# Patient Record
Sex: Female | Born: 1935 | Race: White | Hispanic: No | Smoking: Never smoker
Health system: Southern US, Community
[De-identification: ages and names within clinical notes are randomized; demographics above are authoritative.]

## PROBLEM LIST (undated history)

## (undated) DIAGNOSIS — F329 Major depressive disorder, single episode, unspecified: Secondary | ICD-10-CM

## (undated) DIAGNOSIS — E039 Hypothyroidism, unspecified: Secondary | ICD-10-CM

## (undated) DIAGNOSIS — M6281 Muscle weakness (generalized): Secondary | ICD-10-CM

## (undated) DIAGNOSIS — R1310 Dysphagia, unspecified: Secondary | ICD-10-CM

## (undated) DIAGNOSIS — M810 Age-related osteoporosis without current pathological fracture: Secondary | ICD-10-CM

## (undated) DIAGNOSIS — F0390 Unspecified dementia without behavioral disturbance: Secondary | ICD-10-CM

## (undated) DIAGNOSIS — G4700 Insomnia, unspecified: Secondary | ICD-10-CM

## (undated) DIAGNOSIS — S12000A Unspecified displaced fracture of first cervical vertebra, initial encounter for closed fracture: Secondary | ICD-10-CM

---

## 2018-07-30 ENCOUNTER — Encounter (HOSPITAL_COMMUNITY): Payer: Self-pay | Admitting: Emergency Medicine

## 2018-07-30 ENCOUNTER — Emergency Department (HOSPITAL_COMMUNITY): Payer: Medicare Other

## 2018-07-30 ENCOUNTER — Other Ambulatory Visit: Payer: Self-pay

## 2018-07-30 ENCOUNTER — Emergency Department (HOSPITAL_COMMUNITY)
Admission: EM | Admit: 2018-07-30 | Discharge: 2018-07-30 | Disposition: A | Discharge status: Home / Self Care | Payer: Medicare Other | Attending: Emergency Medicine | Admitting: Emergency Medicine

## 2018-07-30 DIAGNOSIS — Z79899 Other long term (current) drug therapy: Secondary | ICD-10-CM | POA: Insufficient documentation

## 2018-07-30 DIAGNOSIS — F0390 Unspecified dementia without behavioral disturbance: Secondary | ICD-10-CM | POA: Diagnosis not present

## 2018-07-30 DIAGNOSIS — Y9389 Activity, other specified: Secondary | ICD-10-CM | POA: Diagnosis not present

## 2018-07-30 DIAGNOSIS — S0990XA Unspecified injury of head, initial encounter: Secondary | ICD-10-CM | POA: Diagnosis present

## 2018-07-30 DIAGNOSIS — W06XXXA Fall from bed, initial encounter: Secondary | ICD-10-CM | POA: Diagnosis not present

## 2018-07-30 DIAGNOSIS — Y92199 Unspecified place in other specified residential institution as the place of occurrence of the external cause: Secondary | ICD-10-CM | POA: Insufficient documentation

## 2018-07-30 DIAGNOSIS — Y998 Other external cause status: Secondary | ICD-10-CM | POA: Insufficient documentation

## 2018-07-30 DIAGNOSIS — W19XXXA Unspecified fall, initial encounter: Secondary | ICD-10-CM

## 2018-07-30 HISTORY — DX: Unspecified dementia, unspecified severity, without behavioral disturbance, psychotic disturbance, mood disturbance, and anxiety: F03.90

## 2018-07-30 HISTORY — DX: Hypothyroidism, unspecified: E03.9

## 2018-07-30 HISTORY — DX: Unspecified displaced fracture of first cervical vertebra, initial encounter for closed fracture: S12.000A

## 2018-07-30 HISTORY — DX: Major depressive disorder, single episode, unspecified: F32.9

## 2018-07-30 HISTORY — DX: Dysphagia, unspecified: R13.10

## 2018-07-30 HISTORY — DX: Insomnia, unspecified: G47.00

## 2018-07-30 HISTORY — DX: Muscle weakness (generalized): M62.81

## 2018-07-30 HISTORY — DX: Age-related osteoporosis without current pathological fracture: M81.0

## 2018-07-30 NOTE — ED Notes (Signed)
Bed: WA09 Expected date:  Expected time:  Means of arrival:  Comments: 73F fall from facility

## 2018-07-30 NOTE — ED Notes (Signed)
Patient transported to CT 

## 2018-07-30 NOTE — Discharge Instructions (Addendum)
Return here as needed follow-up with her neurosurgeon for recheck.  Return here as needed.

## 2018-07-30 NOTE — ED Triage Notes (Signed)
Pt from [facility]. Patient per staff rolled out of bed onto floor. No deformities noted. Patient has no complaints of pain and no complaints on palpation. Patient alert only to self, hx dementia. At baseline.

## 2018-07-30 NOTE — ED Provider Notes (Signed)
[facility] DEPT Provider Note   CSN: [id number] Arrival date & time: 07/30/18  0603     History   Chief Complaint Chief Complaint  Patient presents with  . Fall    HPI Julia Carey is a 84 y.o. female.  HPI Patient presents to the emergency department after rolling out of bed.  The patient lives in assisted living facility and she had rolled out of bed onto the floor.  Patient has no complaints.  The patient states that is not hurting anywhere.  Patient denies headache, blurred vision, chest pain, shortness of breath, weakness, numbness, neck pain, back pain, extremity pain, abdominal pain, nausea, vomiting, or syncope.  Patient had a fall in August where she had a C1 fracture.  She was placed in a hard collar and had close follow-up with neurosurgery. Past Medical History:  Diagnosis Date  . Age-related osteoporosis without current pathological fracture   . Dementia ([employer])   . Dysphagia   . Fracture of first cervical vertebra (HCC)   . Hypothyroidism   . Insomnia   . Major depression, single episode   . Muscle weakness (generalized)     There are no active problems to display for this patient.      OB History   None      Home Medications    Prior to Admission medications   Medication Sig Start Date End Date Taking? Authorizing Provider  acetaminophen (TYLENOL) 325 MG tablet Take 650 mg by mouth every 8 (eight) hours.    Yes [provider]  aspirin 81 MG chewable tablet Chew 81 mg by mouth as directed. Every 2 days   Yes [provider]  carboxymethylcellul-glycerin (OPTIVE) 0.5-0.9 % ophthalmic solution Place 1 drop into both eyes 4 (four) times daily.   Yes [provider]  cetirizine (ZYRTEC) 10 MG tablet Take 10 mg by mouth daily.   Yes [provider]  donepezil (ARICEPT) 5 MG tablet Take 5 mg by mouth at bedtime.   Yes [provider]  DULoxetine (CYMBALTA) 30 MG capsule Take 30 mg  by mouth daily.   Yes [provider]  fluticasone (FLONASE) 50 MCG/ACT nasal spray Place 1 spray into both nostrils daily.   Yes [provider]  levothyroxine (SYNTHROID, LEVOTHROID) 50 MCG tablet Take 50 mcg by mouth daily before breakfast.   Yes [provider]  losartan (COZAAR) 50 MG tablet Take 50 mg by mouth daily.   Yes [provider]  Melatonin 3 MG TABS Take 3 mg by mouth at bedtime.   Yes [provider]  methocarbamol (ROBAXIN) 500 MG tablet Take 500 mg by mouth every 12 (twelve) hours as needed for muscle spasms.   Yes [provider]  metoprolol tartrate (LOPRESSOR) 25 MG tablet Take 25 mg by mouth daily.   Yes [provider]  omeprazole (PRILOSEC) 20 MG capsule Take 20 mg by mouth daily as needed (acid reflux).    Yes [provider]  polyethylene glycol (MIRALAX / GLYCOLAX) packet Take 17 g by mouth daily.   Yes [provider]  vitamin B-12 (CYANOCOBALAMIN) 1000 MCG tablet Take 1,000 mcg by mouth as directed. Every 2 days   Yes [provider]    Family History No family history on file.  Social History Social History   Tobacco Use  . Smoking status: Unknown If Ever Smoked  Substance Use Topics  . Alcohol use: Not Currently  . Drug use: Not Currently  Allergies   Amoxicillin; Clindamycin/lincomycin; and Keflex [cephalexin]   Review of Systems Review of Systems All other systems negative except as documented in the HPI. All pertinent positives and negatives as reviewed in the HPI.  Physical Exam Updated Vital Signs BP (!) 203/92   Pulse 82   Temp 97.6 F (36.4 C) (Oral)   Resp 14   SpO2 95%   Physical Exam  Constitutional: She is oriented to person, place, and time. She appears well-developed and well-nourished. No distress.  HENT:  Head: Normocephalic and atraumatic.  Mouth/Throat: Oropharynx is clear and moist.  Eyes: Pupils are equal, round, and reactive  to light.  Neck: Normal range of motion. Neck supple.  Cardiovascular: Normal rate, regular rhythm and normal heart sounds. Exam reveals no gallop and no friction rub.  No murmur heard. Pulmonary/Chest: Effort normal and breath sounds normal. No respiratory distress. She has no wheezes.  Abdominal: Soft. Bowel sounds are normal. She exhibits no distension. There is no tenderness. There is no guarding.  Musculoskeletal:       Right shoulder: Normal.       Left shoulder: Normal.       Right hip: Normal. She exhibits normal range of motion.       Left hip: Normal. She exhibits normal range of motion.       Right knee: Normal.       Left knee: Normal.       Right ankle: Normal.       Left ankle: Normal.       Cervical back: Normal.       Thoracic back: Normal.       Lumbar back: Normal.  Neurological: She is alert and oriented to person, place, and time. She has normal strength. No sensory deficit. She exhibits normal muscle tone. Coordination normal. GCS eye subscore is 4. GCS verbal subscore is 5. GCS motor subscore is 6.  Skin: Skin is warm and dry. Capillary refill takes less than 2 seconds. No rash noted. No erythema.  Psychiatric: She has a normal mood and affect. Her behavior is normal.  Nursing note and vitals reviewed.    ED Treatments / Results  Labs (all labs ordered are listed, but only abnormal results are displayed) Labs Reviewed - No data to display  EKG None  Radiology Ct Head Wo Contrast  Result Date: 07/30/2018 CLINICAL DATA:  Head trauma EXAM: CT HEAD WITHOUT CONTRAST CT CERVICAL SPINE WITHOUT CONTRAST TECHNIQUE: Multidetector CT imaging of the head and cervical spine was performed following the standard protocol without intravenous contrast. Multiplanar CT image reconstructions of the cervical spine were also generated. COMPARISON:  None. FINDINGS: CT HEAD FINDINGS Brain: There is atrophy and chronic small vessel disease changes. No acute intracranial abnormality.  Specifically, no hemorrhage, hydrocephalus, mass lesion, acute infarction, or significant intracranial injury. Vascular: No hyperdense vessel or unexpected calcification. Skull: No acute calvarial abnormality. Sinuses/Orbits: Visualized paranasal sinuses and mastoids clear. Orbital soft tissues unremarkable. Other: None CT CERVICAL SPINE FINDINGS Alignment: Severe rightward scoliosis in the cervical spine. Anterolisthesis of C4 on C5 measures 3 mm. 2 mm anterolisthesis of C6 on C7. Findings likely related to facet disease. Skull base and vertebrae: Fractures are noted through the C1 ring. Large gap noted in both the anterior and posterior ring. The underlying bones appear corticated. There appears to be a large amount of the posterior C1 ring that is absent, likely resorbed. The appearance is most suggestive of an old C1 fracture with nonunion. No definite  acute fracture noted. Soft tissues and spinal canal: Prevertebral soft tissues are normal. No epidural or paraspinal hematoma. Disc levels:  Diffuse degenerative disc and facet disease. Upper chest: Biapical scarring. Other: None IMPRESSION: No acute intracranial abnormality. Atrophy, chronic microvascular disease. Severe right word scoliosis in the cervical spine with advanced degenerative disc and facet disease. Chronic appearing/old fractures through the anterior and posterior rings of C1. A large amount of the posterior ring appears resorbed. I see no definite acute fracture. Electronically Signed   By: Rolm Baptise M.D.   On: 07/30/2018 09:23   Ct Cervical Spine Wo Contrast  Result Date: 07/30/2018 CLINICAL DATA:  Head trauma EXAM: CT HEAD WITHOUT CONTRAST CT CERVICAL SPINE WITHOUT CONTRAST TECHNIQUE: Multidetector CT imaging of the head and cervical spine was performed following the standard protocol without intravenous contrast. Multiplanar CT image reconstructions of the cervical spine were also generated. COMPARISON:  None. FINDINGS: CT HEAD FINDINGS  Brain: There is atrophy and chronic small vessel disease changes. No acute intracranial abnormality. Specifically, no hemorrhage, hydrocephalus, mass lesion, acute infarction, or significant intracranial injury. Vascular: No hyperdense vessel or unexpected calcification. Skull: No acute calvarial abnormality. Sinuses/Orbits: Visualized paranasal sinuses and mastoids clear. Orbital soft tissues unremarkable. Other: None CT CERVICAL SPINE FINDINGS Alignment: Severe rightward scoliosis in the cervical spine. Anterolisthesis of C4 on C5 measures 3 mm. 2 mm anterolisthesis of C6 on C7. Findings likely related to facet disease. Skull base and vertebrae: Fractures are noted through the C1 ring. Large gap noted in both the anterior and posterior ring. The underlying bones appear corticated. There appears to be a large amount of the posterior C1 ring that is absent, likely resorbed. The appearance is most suggestive of an old C1 fracture with nonunion. No definite acute fracture noted. Soft tissues and spinal canal: Prevertebral soft tissues are normal. No epidural or paraspinal hematoma. Disc levels:  Diffuse degenerative disc and facet disease. Upper chest: Biapical scarring. Other: None IMPRESSION: No acute intracranial abnormality. Atrophy, chronic microvascular disease. Severe right word scoliosis in the cervical spine with advanced degenerative disc and facet disease. Chronic appearing/old fractures through the anterior and posterior rings of C1. A large amount of the posterior ring appears resorbed. I see no definite acute fracture. Electronically Signed   By: Rolm Baptise M.D.   On: 07/30/2018 09:23    Procedures Procedures (including critical care time)  Medications Ordered in ED Medications - No data to display   Initial Impression / Assessment and Plan / ED Course  I have reviewed the triage vital signs and the nursing notes.  Pertinent labs & imaging results that were available during my care of the  patient were reviewed by me and considered in my medical decision making (see chart for details).    Patient will be discharged back to the nursing facility.  The patient CT scans did not show any significant abnormality at this time.  Patient is advised of the plan and all questions were answered.  The patient's son is also in the room and given the information.  I have advised him to follow-up with her neurosurgeon for recheck of her neck.  Patient has no neurological deficits or abnormalities noted on examination.  Final Clinical Impressions(s) / ED Diagnoses   Final diagnoses:  None    ED Discharge Orders    None       Dalia Heading, PA-C 07/30/18 [id number]    Jola Schmidt, MD 07/30/18 1640

## 2018-07-30 NOTE — ED Notes (Signed)
PA aware of HTN. Patient has not gotten her morning meds.

## 2018-08-29 ENCOUNTER — Emergency Department (HOSPITAL_COMMUNITY): Payer: Medicare Other

## 2018-08-29 ENCOUNTER — Encounter (HOSPITAL_COMMUNITY): Payer: Self-pay

## 2018-08-29 ENCOUNTER — Emergency Department (HOSPITAL_COMMUNITY)
Admission: EM | Admit: 2018-08-29 | Discharge: 2018-08-29 | Disposition: A | Discharge status: Skilled Nursing Facility | Payer: Medicare Other | Attending: Emergency Medicine | Admitting: Emergency Medicine

## 2018-08-29 DIAGNOSIS — E039 Hypothyroidism, unspecified: Secondary | ICD-10-CM | POA: Insufficient documentation

## 2018-08-29 DIAGNOSIS — W010XXA Fall on same level from slipping, tripping and stumbling without subsequent striking against object, initial encounter: Secondary | ICD-10-CM

## 2018-08-29 DIAGNOSIS — Z79899 Other long term (current) drug therapy: Secondary | ICD-10-CM | POA: Diagnosis not present

## 2018-08-29 DIAGNOSIS — W19XXXA Unspecified fall, initial encounter: Secondary | ICD-10-CM | POA: Diagnosis not present

## 2018-08-29 DIAGNOSIS — S6991XA Unspecified injury of right wrist, hand and finger(s), initial encounter: Secondary | ICD-10-CM | POA: Diagnosis present

## 2018-08-29 DIAGNOSIS — Y999 Unspecified external cause status: Secondary | ICD-10-CM | POA: Diagnosis not present

## 2018-08-29 DIAGNOSIS — S52501A Unspecified fracture of the lower end of right radius, initial encounter for closed fracture: Secondary | ICD-10-CM | POA: Diagnosis not present

## 2018-08-29 DIAGNOSIS — Z7982 Long term (current) use of aspirin: Secondary | ICD-10-CM | POA: Diagnosis not present

## 2018-08-29 DIAGNOSIS — F0390 Unspecified dementia without behavioral disturbance: Secondary | ICD-10-CM | POA: Diagnosis not present

## 2018-08-29 DIAGNOSIS — Y92129 Unspecified place in nursing home as the place of occurrence of the external cause: Secondary | ICD-10-CM | POA: Insufficient documentation

## 2018-08-29 DIAGNOSIS — Y939 Activity, unspecified: Secondary | ICD-10-CM | POA: Diagnosis not present

## 2018-08-29 MED ORDER — ACETAMINOPHEN 325 MG PO TABS
650.0000 mg | ORAL_TABLET | Freq: Once | ORAL | Status: AC
  Administered 2018-08-29: 650 mg via ORAL
  Filled 2018-08-29: qty 2

## 2018-08-29 NOTE — ED Notes (Signed)
Ortho has applied splint and immobilizer

## 2018-08-29 NOTE — ED Provider Notes (Signed)
[facility] DEPT Provider Note   CSN: [id number] Arrival date & time: 08/29/18  1804     History   Chief Complaint No chief complaint on file.   HPI Julia Carey is a 84 y.o. female.  Patient s/p fall at ecf. No noted loc. Pt denies faintness or dizziness. No headache. Denies neck or back pain - wears soft cervical collar due to prior neck injury. Patients mental status has remains c/w baseline since fall. Pt c/o right wrist pain, constant, dull, moderate, non radiating. No associated numbness/weakness. Denies other pain or injury. Skin is intact.   The history is provided by the patient and the EMS personnel.    Past Medical History:  Diagnosis Date  . Age-related osteoporosis without current pathological fracture   . Dementia ([place])   . Dysphagia   . Fracture of first cervical vertebra (HCC)   . Hypothyroidism   . Insomnia   . Major depression, single episode   . Muscle weakness (generalized)     There are no active problems to display for this patient.   History reviewed. No pertinent surgical history.   OB History   None      Home Medications    Prior to Admission medications   Medication Sig Start Date End Date Taking? Authorizing Provider  acetaminophen (TYLENOL) 325 MG tablet Take 650 mg by mouth every 8 (eight) hours.     [provider]  aspirin 81 MG chewable tablet Chew 81 mg by mouth as directed. Every 2 days    [provider]  carboxymethylcellul-glycerin (OPTIVE) 0.5-0.9 % ophthalmic solution Place 1 drop into both eyes 4 (four) times daily.    [provider]  cetirizine (ZYRTEC) 10 MG tablet Take 10 mg by mouth daily.    [provider]  donepezil (ARICEPT) 5 MG tablet Take 5 mg by mouth at bedtime.    [provider]  DULoxetine (CYMBALTA) 30 MG capsule Take 30 mg by mouth daily.    [provider]  fluticasone (FLONASE) 50 MCG/ACT nasal spray Place 1 spray  into both nostrils daily.    [provider]  levothyroxine (SYNTHROID, LEVOTHROID) 50 MCG tablet Take 50 mcg by mouth daily before breakfast.    [provider]  losartan (COZAAR) 50 MG tablet Take 50 mg by mouth daily.    [provider]  Melatonin 3 MG TABS Take 3 mg by mouth at bedtime.    [provider]  methocarbamol (ROBAXIN) 500 MG tablet Take 500 mg by mouth every 12 (twelve) hours as needed for muscle spasms.    [provider]  metoprolol tartrate (LOPRESSOR) 25 MG tablet Take 25 mg by mouth daily.    [provider]  omeprazole (PRILOSEC) 20 MG capsule Take 20 mg by mouth daily as needed (acid reflux).     [provider]  polyethylene glycol (MIRALAX / GLYCOLAX) packet Take 17 g by mouth daily.    [provider]  vitamin B-12 (CYANOCOBALAMIN) 1000 MCG tablet Take 1,000 mcg by mouth as directed. Every 2 days    [provider]    Family History No family history on file.  Social History Social History   Tobacco Use  . Smoking status: Unknown If Ever Smoked  Substance Use Topics  . Alcohol use: Not Currently  . Drug use: Not Currently     Allergies   Amoxicillin; Clindamycin/lincomycin; and Keflex [cephalexin]   Review of Systems Review of Systems  Constitutional: Negative for fever.  HENT: Negative for nosebleeds.   Eyes: Negative for pain and visual disturbance.  Respiratory: Negative for shortness of breath.   Cardiovascular: Negative for chest pain.  Gastrointestinal: Negative for abdominal pain, nausea and vomiting.  Genitourinary: Negative for flank pain.  Musculoskeletal: Negative for back pain and neck pain.  Skin: Negative for wound.  Neurological: Negative for numbness and headaches.  Hematological: Does not bruise/bleed easily.  Psychiatric/Behavioral: Negative for agitation.     Physical Exam Updated Vital Signs BP (!) 215/77 (BP Location: Left Arm)   Pulse 75    Temp 97.9 F (36.6 C) (Oral)   Resp 15   SpO2 96%   Physical Exam  Constitutional: She appears well-developed and well-nourished.  HENT:  Head: Atraumatic.  No head/face/scalp sts, contusion, or tenderness.   Eyes: Pupils are equal, round, and reactive to light. Conjunctivae are normal. No scleral icterus.  Neck: Neck supple. No tracheal deviation present.  Soft cervical collar in place.   Cardiovascular: Normal rate, regular rhythm, normal heart sounds and intact distal pulses.  Pulmonary/Chest: Effort normal and breath sounds normal. No respiratory distress. She exhibits no tenderness.  Abdominal: Soft. Normal appearance and bowel sounds are normal. She exhibits no distension. There is no tenderness.  Musculoskeletal: She exhibits no edema.  CTLS spine, non tender, aligned, no step off. Tenderness right wrist, otherwise good rom bil extremities without pain or focal bony tenderness. Radial pulse 2+ bil.   Neurological: She is alert.  Awake and alert. Speech clear. Motor/sens grossly intact bil.   Skin: Skin is warm and dry. No rash noted.  Psychiatric: She has a normal mood and affect.  Nursing note and vitals reviewed.    ED Treatments / Results  Labs (all labs ordered are listed, but only abnormal results are displayed) Labs Reviewed - No data to display  EKG None  Radiology Dg Wrist Complete Right  Result Date: 08/29/2018 CLINICAL DATA:  Right wrist pain following a fall. EXAM: RIGHT WRIST - COMPLETE 3+ VIEW COMPARISON:  None. FINDINGS: Transverse fracture of the distal radial metaphysis with dorsal displacement of a distal fragment and dorsal angulation of the main distal fragment. There may be extension into the radiocarpal joint. The distal ulna is intact. Screw and plate fixation of the 1st metacarpal is noted with degenerative changes at the 1st metacarpal/carpal joint with bony remodeling of the trapezium. IMPRESSION: Distal radius fracture, as described above.  Electronically Signed   By: Claudie Revering M.D.   On: 08/29/2018 18:51    Procedures Procedures (including critical care time)  Medications Ordered in ED Medications - No data to display   Initial Impression / Assessment and Plan / ED Course  I have reviewed the triage vital signs and the nursing notes.  Pertinent labs & imaging results that were available during my care of the patient were reviewed by me and considered in my medical decision making (see chart for details).  Xrays.  Reviewed nursing notes and prior charts for additional history.   xrays reviewed - right distal radius fx.   sugartong splint applied by ortho tech.  Radial pulse 2+.   Acetaminophen po for pain. Elevate. Ice.  Pt remains alert, mental status c/w baseline. No new c/o or other pain.  Pt currently appears stable for d/c.     Final Clinical Impressions(s) / ED Diagnoses   Final diagnoses:  None    ED Discharge Orders    None  Lajean Saver, MD 08/29/18 [phone]

## 2018-08-29 NOTE — ED Notes (Signed)
Ortho called for splint  

## 2018-08-29 NOTE — ED Notes (Signed)
Patient transported to X-ray 

## 2018-08-29 NOTE — ED Notes (Addendum)
Family asked about the patients DNR from, spoke to med-tech at [facility], confirmed it was at the facility. Family made aware. Discharge instructions given to facility.

## 2018-08-29 NOTE — Discharge Instructions (Addendum)
It was our pleasure to provide your ER care today - we hope that you feel better.  Wear splint, keep it clean/dry.  Elevate hand/wrist to help with swelling and pain.  Take acetaminophen as need for pain.   Follow up with orthopedist in the coming week - call office tomorrow to arrange appointment.   Return to ER if worse, new symptoms, trouble breathing, other concern.   Also follow up with primary care doctor for recheck, and recheck of your blood pressure, as it is high today.

## 2018-08-29 NOTE — ED Notes (Signed)
Bed: WA16 Expected date:  Expected time:  Means of arrival:  Comments: fall 

## 2018-08-29 NOTE — ED Triage Notes (Signed)
Patient presented to ed with c/o unwitnessed fall around 1600. Patient have right wrist and right humerus . Patient have hx of dementia, she is alert to self only. Patient have c-collar on due to fracture of first cervical vertebra that she wear all the time per PCP order.

## 2018-09-25 ENCOUNTER — Emergency Department (HOSPITAL_COMMUNITY): Payer: Medicare Other

## 2018-09-25 ENCOUNTER — Inpatient Hospital Stay (HOSPITAL_COMMUNITY)
Admission: EM | Admit: 2018-09-25 | Discharge: 2018-09-28 | DRG: 083 | Disposition: A | Discharge status: Nursing Home (Includes ICF) | Payer: Medicare Other | Attending: Family Medicine | Admitting: Family Medicine

## 2018-09-25 DIAGNOSIS — Y92009 Unspecified place in unspecified non-institutional (private) residence as the place of occurrence of the external cause: Secondary | ICD-10-CM

## 2018-09-25 DIAGNOSIS — Z79890 Hormone replacement therapy (postmenopausal): Secondary | ICD-10-CM

## 2018-09-25 DIAGNOSIS — S50312A Abrasion of left elbow, initial encounter: Secondary | ICD-10-CM | POA: Diagnosis present

## 2018-09-25 DIAGNOSIS — E039 Hypothyroidism, unspecified: Secondary | ICD-10-CM | POA: Diagnosis present

## 2018-09-25 DIAGNOSIS — R4189 Other symptoms and signs involving cognitive functions and awareness: Secondary | ICD-10-CM | POA: Diagnosis present

## 2018-09-25 DIAGNOSIS — G9349 Other encephalopathy: Secondary | ICD-10-CM | POA: Diagnosis present

## 2018-09-25 DIAGNOSIS — R1310 Dysphagia, unspecified: Secondary | ICD-10-CM | POA: Diagnosis present

## 2018-09-25 DIAGNOSIS — D179 Benign lipomatous neoplasm, unspecified: Secondary | ICD-10-CM | POA: Diagnosis present

## 2018-09-25 DIAGNOSIS — S066X9A Traumatic subarachnoid hemorrhage with loss of consciousness of unspecified duration, initial encounter: Secondary | ICD-10-CM | POA: Diagnosis not present

## 2018-09-25 DIAGNOSIS — N390 Urinary tract infection, site not specified: Secondary | ICD-10-CM | POA: Diagnosis present

## 2018-09-25 DIAGNOSIS — B9620 Unspecified Escherichia coli [E. coli] as the cause of diseases classified elsewhere: Secondary | ICD-10-CM | POA: Diagnosis present

## 2018-09-25 DIAGNOSIS — Z79899 Other long term (current) drug therapy: Secondary | ICD-10-CM

## 2018-09-25 DIAGNOSIS — Z66 Do not resuscitate: Secondary | ICD-10-CM | POA: Diagnosis present

## 2018-09-25 DIAGNOSIS — W19XXXA Unspecified fall, initial encounter: Secondary | ICD-10-CM

## 2018-09-25 DIAGNOSIS — Z7951 Long term (current) use of inhaled steroids: Secondary | ICD-10-CM

## 2018-09-25 DIAGNOSIS — I609 Nontraumatic subarachnoid hemorrhage, unspecified: Secondary | ICD-10-CM

## 2018-09-25 DIAGNOSIS — Y92129 Unspecified place in nursing home as the place of occurrence of the external cause: Secondary | ICD-10-CM

## 2018-09-25 DIAGNOSIS — F0390 Unspecified dementia without behavioral disturbance: Secondary | ICD-10-CM | POA: Diagnosis present

## 2018-09-25 DIAGNOSIS — S0181XA Laceration without foreign body of other part of head, initial encounter: Secondary | ICD-10-CM | POA: Diagnosis present

## 2018-09-25 DIAGNOSIS — S0003XA Contusion of scalp, initial encounter: Secondary | ICD-10-CM

## 2018-09-25 DIAGNOSIS — I10 Essential (primary) hypertension: Secondary | ICD-10-CM | POA: Diagnosis present

## 2018-09-25 DIAGNOSIS — G4700 Insomnia, unspecified: Secondary | ICD-10-CM | POA: Diagnosis present

## 2018-09-25 DIAGNOSIS — Z881 Allergy status to other antibiotic agents status: Secondary | ICD-10-CM

## 2018-09-25 DIAGNOSIS — S066X1A Traumatic subarachnoid hemorrhage with loss of consciousness of 30 minutes or less, initial encounter: Secondary | ICD-10-CM | POA: Diagnosis present

## 2018-09-25 DIAGNOSIS — M069 Rheumatoid arthritis, unspecified: Secondary | ICD-10-CM | POA: Diagnosis present

## 2018-09-25 DIAGNOSIS — Z7982 Long term (current) use of aspirin: Secondary | ICD-10-CM

## 2018-09-25 DIAGNOSIS — M6281 Muscle weakness (generalized): Secondary | ICD-10-CM | POA: Diagnosis present

## 2018-09-25 DIAGNOSIS — M810 Age-related osteoporosis without current pathological fracture: Secondary | ICD-10-CM | POA: Diagnosis present

## 2018-09-25 LAB — URINALYSIS, ROUTINE W REFLEX MICROSCOPIC
Bilirubin Urine: NEGATIVE
Glucose, UA: NEGATIVE mg/dL
Ketones, ur: NEGATIVE mg/dL
Nitrite: POSITIVE — AB
PROTEIN: NEGATIVE mg/dL
Specific Gravity, Urine: 1.011 (ref 1.005–1.030)
pH: 8 (ref 5.0–8.0)

## 2018-09-25 LAB — COMPREHENSIVE METABOLIC PANEL
ALK PHOS: 109 U/L (ref 38–126)
ALT: 15 U/L (ref 0–44)
AST: 24 U/L (ref 15–41)
Albumin: 3.6 g/dL (ref 3.5–5.0)
Anion gap: 8 (ref 5–15)
BUN: 22 mg/dL (ref 8–23)
CALCIUM: 10 mg/dL (ref 8.9–10.3)
CHLORIDE: 108 mmol/L (ref 98–111)
CO2: 26 mmol/L (ref 22–32)
CREATININE: 1.3 mg/dL — AB (ref 0.44–1.00)
GFR calc Af Amer: 42 mL/min — ABNORMAL LOW (ref >60)
GFR, EST NON AFRICAN AMERICAN: 37 mL/min — AB (ref >60)
Glucose, Bld: 150 mg/dL — ABNORMAL HIGH (ref 70–99)
Potassium: 3.7 mmol/L (ref 3.5–5.1)
Sodium: 142 mmol/L (ref 135–145)
Total Bilirubin: 0.7 mg/dL (ref 0.3–1.2)
Total Protein: 6.7 g/dL (ref 6.5–8.1)

## 2018-09-25 LAB — I-STAT TROPONIN, ED: Troponin i, poc: 0.01 ng/mL (ref 0.00–0.08)

## 2018-09-25 LAB — CBC
HCT: 39.3 % (ref 36.0–46.0)
Hemoglobin: 11.9 g/dL — ABNORMAL LOW (ref 12.0–15.0)
MCH: 31.2 pg (ref 26.0–34.0)
MCHC: 30.3 g/dL (ref 30.0–36.0)
MCV: 102.9 fL — AB (ref 80.0–100.0)
PLATELETS: 258 10*3/uL (ref 150–400)
RBC: 3.82 MIL/uL — ABNORMAL LOW (ref 3.87–5.11)
RDW: 13.4 % (ref 11.5–15.5)
WBC: 9.4 10*3/uL (ref 4.0–10.5)
nRBC: 0 % (ref 0.0–0.2)

## 2018-09-25 LAB — I-STAT CG4 LACTIC ACID, ED: Lactic Acid, Venous: 1.72 mmol/L (ref 0.5–1.9)

## 2018-09-25 LAB — CK: Total CK: 94 U/L (ref 38–234)

## 2018-09-25 MED ORDER — SODIUM CHLORIDE 0.9 % IV BOLUS
1000.0000 mL | Freq: Once | INTRAVENOUS | Status: AC
  Administered 2018-09-25: 1000 mL via INTRAVENOUS

## 2018-09-25 MED ORDER — POLYETHYLENE GLYCOL 3350 17 G PO PACK
17.0000 g | PACK | Freq: Every day | ORAL | Status: DC
  Administered 2018-09-27 – 2018-09-28 (×2): 17 g via ORAL
  Filled 2018-09-25 (×2): qty 1

## 2018-09-25 MED ORDER — LORAZEPAM 2 MG/ML IJ SOLN
1.0000 mg | Freq: Once | INTRAMUSCULAR | Status: AC
  Administered 2018-09-25: 1 mg via INTRAVENOUS
  Filled 2018-09-25: qty 1

## 2018-09-25 MED ORDER — LORAZEPAM 2 MG/ML IJ SOLN
1.0000 mg | Freq: Once | INTRAMUSCULAR | Status: AC
  Administered 2018-09-25: 1 mg via INTRAVENOUS

## 2018-09-25 MED ORDER — LORAZEPAM 2 MG/ML IJ SOLN
1.0000 mg | Freq: Four times a day (QID) | INTRAMUSCULAR | Status: AC | PRN
  Administered 2018-09-25 – 2018-09-26 (×2): 1 mg via INTRAVENOUS
  Filled 2018-09-25 (×2): qty 1

## 2018-09-25 MED ORDER — ACETAMINOPHEN 325 MG PO TABS
325.0000 mg | ORAL_TABLET | Freq: Three times a day (TID) | ORAL | Status: DC
  Administered 2018-09-27 – 2018-09-28 (×5): 325 mg via ORAL
  Filled 2018-09-25 (×7): qty 1

## 2018-09-25 MED ORDER — FLUTICASONE PROPIONATE 50 MCG/ACT NA SUSP
1.0000 | Freq: Every day | NASAL | Status: DC
  Administered 2018-09-28: 1 via NASAL
  Filled 2018-09-25: qty 16

## 2018-09-25 MED ORDER — LIDOCAINE-EPINEPHRINE (PF) 2 %-1:200000 IJ SOLN
10.0000 mL | Freq: Once | INTRAMUSCULAR | Status: AC
  Administered 2018-09-25: 10 mL
  Filled 2018-09-25: qty 20

## 2018-09-25 MED ORDER — HYDRALAZINE HCL 20 MG/ML IJ SOLN
5.0000 mg | Freq: Once | INTRAMUSCULAR | Status: AC
  Administered 2018-09-25: 5 mg via INTRAVENOUS
  Filled 2018-09-25: qty 1

## 2018-09-25 MED ORDER — CLEVIDIPINE BUTYRATE 0.5 MG/ML IV EMUL
0.0000 mg/h | INTRAVENOUS | Status: DC
  Administered 2018-09-25: 1 mg/h via INTRAVENOUS
  Filled 2018-09-25: qty 50

## 2018-09-25 MED ORDER — OCUSOFT EYELID CLEANSING EX PADS: MEDICATED_PAD | Freq: Every day | CUTANEOUS | Status: DC

## 2018-09-25 MED ORDER — MELATONIN 3 MG PO TABS
3.0000 mg | ORAL_TABLET | Freq: Every day | ORAL | Status: DC
  Administered 2018-09-27: 3 mg via ORAL
  Filled 2018-09-25 (×4): qty 1

## 2018-09-25 MED ORDER — SODIUM CHLORIDE 0.9 % IV SOLN
INTRAVENOUS | Status: DC
  Administered 2018-09-25: 21:00:00 via INTRAVENOUS

## 2018-09-25 MED ORDER — METOPROLOL TARTRATE 25 MG PO TABS
25.0000 mg | ORAL_TABLET | ORAL | Status: DC
  Administered 2018-09-27 – 2018-09-28 (×2): 25 mg via ORAL
  Filled 2018-09-25 (×2): qty 1

## 2018-09-25 MED ORDER — VITAMIN B-12 1000 MCG PO TABS
1000.0000 ug | ORAL_TABLET | ORAL | Status: DC
  Administered 2018-09-28: 1000 ug via ORAL
  Filled 2018-09-25: qty 1

## 2018-09-25 MED ORDER — DULOXETINE HCL 30 MG PO CPEP
30.0000 mg | ORAL_CAPSULE | Freq: Every day | ORAL | Status: DC
  Administered 2018-09-27 – 2018-09-28 (×2): 30 mg via ORAL
  Filled 2018-09-25 (×2): qty 1

## 2018-09-25 MED ORDER — LORATADINE 10 MG PO TABS
10.0000 mg | ORAL_TABLET | Freq: Every day | ORAL | Status: DC
  Administered 2018-09-27 – 2018-09-28 (×2): 10 mg via ORAL
  Filled 2018-09-25 (×2): qty 1

## 2018-09-25 MED ORDER — DONEPEZIL HCL 10 MG PO TABS
10.0000 mg | ORAL_TABLET | Freq: Every day | ORAL | Status: DC
  Administered 2018-09-27: 10 mg via ORAL
  Filled 2018-09-25 (×3): qty 1

## 2018-09-25 MED ORDER — LOSARTAN POTASSIUM 50 MG PO TABS
50.0000 mg | ORAL_TABLET | Freq: Every day | ORAL | Status: DC
  Administered 2018-09-27 – 2018-09-28 (×2): 50 mg via ORAL
  Filled 2018-09-25 (×2): qty 1

## 2018-09-25 MED ORDER — CARBOXYMETH-GLYCERIN-POLYSORB 0.5-1-0.5 % OP SOLN: 1.0000 [drp] | Freq: Four times a day (QID) | OPHTHALMIC | Status: DC

## 2018-09-25 MED ORDER — LABETALOL HCL 5 MG/ML IV SOLN
10.0000 mg | Freq: Once | INTRAVENOUS | Status: AC
  Administered 2018-09-25: 10 mg via INTRAVENOUS
  Filled 2018-09-25: qty 4

## 2018-09-25 MED ORDER — LEVOTHYROXINE SODIUM 50 MCG PO TABS
50.0000 ug | ORAL_TABLET | Freq: Every day | ORAL | Status: DC
  Administered 2018-09-28: 50 ug via ORAL
  Filled 2018-09-25: qty 1

## 2018-09-25 MED ORDER — PANTOPRAZOLE SODIUM 40 MG PO TBEC: 40.0000 mg | DELAYED_RELEASE_TABLET | Freq: Every day | ORAL | Status: DC

## 2018-09-25 NOTE — ED Notes (Signed)
Admitting provider at bedside for evaluation.

## 2018-09-25 NOTE — ED Notes (Signed)
Patient transported to CT 

## 2018-09-25 NOTE — ED Notes (Signed)
Attempted in and out catheter. Catheter placement was accurate but pt was too clenched to advance catheter. Will reattempt in and out catheter when pt is more relaxed.

## 2018-09-25 NOTE — ED Notes (Signed)
Surgicel bandage placed on patient's left anterior head to control bleeding. Approx 6 minutes later, bleeding noted to be worsened and soaked through multiple dressings. Surgicel removed, gauze and pressure applied. PA notified and is bedside providing assistance.

## 2018-09-25 NOTE — H&P (Addendum)
History and Physical  Julia Carey KGY:[id number] DOB: [birth year] DOA: 09/25/2018  Referring physician: Etter Sjogren, PA-C  PCP: Lynnell Catalan, [place]  Outpatient Specialists:  Patient coming from: Nursing home & is not able to ambulate   Chief Complaint: Fall at the nursing home  HPI: Julia Carey is a 84 y.o. female with medical history significant for past medical history of dementia hypothyroidism depression generalized muscle weakness, C1 fracture May 26, 2018 she was wearing a neck collar and then the neurosurgeon released the neck collar and started her on soft collar.  Also right wrist fracture about 4 weeks ago she still wearing a cast osteoporosis rheumatoid arthritis who was brought from the nursing facility this morning when patient was found on the floor did not know how long she had been on the floor patient at baseline is confused but talkative and able to communicate and he noted that she was not comfortable as talkative as she used to be.  His par over 20 high-power 20 who is at bedside Mr. Kai Levins stated that the nursing home called him at about 8 AM this morning to report of her fall and the incident.    ED Course: In the ED her blood pressure was noted to be elevated and she was given clevidipine infusion.  She also received 1000 mils of normal saline.  Hydralazine 5 mg.  Epinephrine lidocaine was applied to her area of contusion lorazepam was given for agitation x2  Review of Systems: Patient seen patient unable to give review of systems due to dementia.     Past Medical History:  Diagnosis Date  . Age-related osteoporosis without current pathological fracture   . Dementia ([place])   . Dysphagia   . Fracture of first cervical vertebra (HCC)   . Hypothyroidism   . Insomnia   . Major depression, single episode   . Muscle weakness (generalized)    No past surgical history on file.  Social History:  reports previous alcohol use. She reports previous  drug use. No history on file for tobacco.   Allergies  Allergen Reactions  . Amoxicillin Other (See Comments)    On MAR  . Clindamycin/Lincomycin Other (See Comments)    On MAR  . Keflex [Cephalexin] Other (See Comments)    On MAR    No family history on file.    Prior to Admission medications   Medication Sig Start Date End Date Taking? Authorizing Provider  acetaminophen (TYLENOL) 325 MG tablet Take 325 mg by mouth 3 (three) times daily.    Yes [provider]  aspirin 81 MG tablet Take 81 mg by mouth every other day. Every 2 days    Yes [provider]  Carboxymeth-Glycerin-Polysorb (REFRESH OPTIVE ADVANCED) 0.5-1-0.5 % SOLN Place 1 drop into both eyes 4 (four) times daily.   Yes [provider]  cetirizine (ZYRTEC) 10 MG tablet Take 10 mg by mouth daily.   Yes [provider]  donepezil (ARICEPT) 10 MG tablet Take 10 mg by mouth at bedtime.    Yes [provider]  DULoxetine (CYMBALTA) 30 MG capsule Take 30 mg by mouth daily.   Yes [provider]  Eyelid Cleansers (OCUSOFT EYELID CLEANSING EX) Apply 1 application topically at bedtime.   Yes [provider]  fluticasone (FLONASE) 50 MCG/ACT nasal spray Place 1 spray into both nostrils daily.   Yes [provider]  levothyroxine (SYNTHROID, LEVOTHROID) 50 MCG tablet Take 50 mcg by mouth daily before  breakfast.   Yes [provider]  losartan (COZAAR) 50 MG tablet Take 50 mg by mouth daily.   Yes [provider]  Melatonin 3 MG TABS Take 3 mg by mouth at bedtime.   Yes [provider]  methocarbamol (ROBAXIN) 500 MG tablet Take 500 mg by mouth every 12 (twelve) hours as needed for muscle spasms.   Yes [provider]  metoprolol tartrate (LOPRESSOR) 25 MG tablet Take 25 mg by mouth every morning.    Yes [provider]  omeprazole (PRILOSEC) 20 MG capsule Take 20 mg by mouth daily as needed (acid reflux).    Yes  [provider]  polyethylene glycol (MIRALAX / GLYCOLAX) packet Take 17 g by mouth daily.   Yes [provider]  vitamin B-12 (CYANOCOBALAMIN) 1000 MCG tablet Take 1,000 mcg by mouth every other day. Every 2 days    Yes [provider]    Physical Exam: BP (!) 139/58   Pulse (!) 112   Resp 19   SpO2 98%   Exam:  . General: 84 y.o. year-old female well developed well nourished in no acute distress.  Alert and oriented x3. Marland Kitchen HEENT: Patient is in hard cervical collar.  She has contusion on the left temple which is dressed with pressure dressing . Cardiovascular: Regular rate and rhythm with no rubs or gallops.  No thyromegaly or JVD noted.   Marland Kitchen Respiratory: Clear to auscultation with no wheezes or rales. Good inspiratory effort. . Abdomen: Soft nontender nondistended with normal bowel sounds x4 quadrants. . Musculoskeletal: No lower extremity edema. 2/4 pulses in all 4 extremities she has contusion of her left thumb area and left elbow which is ecchymotic also contusion of the left eyelid eyelid.  She has a long right arm cast from an old fracture that occurred 4 weeks ago . Skin: No ulcerative lesions noted or rashes, . Psychiatry: Mood is appropriate for condition and setting           Labs on Admission:  Basic Metabolic Panel: Recent Labs  Lab 09/25/18 0853  NA 142  K 3.7  CL 108  CO2 26  GLUCOSE 150*  BUN 22  CREATININE 1.30*  CALCIUM 10.0   Liver Function Tests: Recent Labs  Lab 09/25/18 0853  AST 24  ALT 15  ALKPHOS 109  BILITOT 0.7  PROT 6.7  ALBUMIN 3.6   No results for input(s): LIPASE, AMYLASE in the last 168 hours. No results for input(s): AMMONIA in the last 168 hours. CBC: Recent Labs  Lab 09/25/18 0853  WBC 9.4  HGB 11.9*  HCT 39.3  MCV 102.9*  PLT 258   Cardiac Enzymes: Recent Labs  Lab 09/25/18 0906  CKTOTAL 94    BNP (last 3 results) No results for input(s): BNP in the last 8760 hours.  ProBNP (last 3  results) No results for input(s): PROBNP in the last 8760 hours.  CBG: No results for input(s): GLUCAP in the last 168 hours.  Radiological Exams on Admission: Dg Chest 1 View  Result Date: 09/25/2018 CLINICAL DATA:  Pain after fall. EXAM: CHEST  1 VIEW COMPARISON:  None. FINDINGS: The heart, hila, and mediastinum are normal. No pneumothorax. No pulmonary nodules or masses. No focal infiltrates identified. IMPRESSION: No active disease. Electronically Signed   By: Dorise Bullion III M.D   On: 09/25/2018 11:49   Dg Pelvis 1-2 Views  Result Date: 09/25/2018 CLINICAL DATA:  Pain after fall EXAM: PELVIS - 1-2 VIEW COMPARISON:  None. FINDINGS: There is no evidence of pelvic fracture or diastasis. No pelvic bone lesions are seen. IMPRESSION: Negative. Electronically Signed   By: Dorise Bullion III M.D   On: 09/25/2018 11:52   Dg Elbow 2 Views Left  Result Date: 09/25/2018 CLINICAL DATA:  Fall today.  Posterior left elbow abrasion. EXAM: LEFT ELBOW - 2 VIEW COMPARISON:  None. FINDINGS: There is an IV in the antecubital fossa. The bones appear adequately mineralized. No evidence of acute fracture, dislocation or joint effusion. No focal soft tissue swelling, emphysema or foreign body identified. IMPRESSION: No acute osseous findings. Electronically Signed   By: Richardean Sale M.D.   On: 09/25/2018 13:09   Ct Head Wo Contrast  Result Date: 09/25/2018 CLINICAL DATA:  Unwitnessed fall. Combative and nonverbal. EXAM: CT HEAD WITHOUT CONTRAST CT MAXILLOFACIAL WITHOUT CONTRAST CT CERVICAL SPINE WITHOUT CONTRAST TECHNIQUE: Multidetector CT imaging of the head, cervical spine, and maxillofacial structures were performed using the standard protocol without intravenous contrast. Multiplanar CT image reconstructions of the cervical spine and maxillofacial structures were also generated. COMPARISON:  07/30/2018. FINDINGS: The patient was unable to remain motionless for the exam. Small or subtle lesions could  be overlooked. CT HEAD FINDINGS Brain: Advanced atrophy with chronic microvascular ischemic change. Layering intraventricular hemorrhage affecting the RIGHT greater than LEFT lateral ventricles. Mild subarachnoid hemorrhage over the frontal convexity, with a suspected punctate subcortical shearing injury RIGHT frontal lobe, series 3, image 27. No significant subdural collection or midline shift. Slightly greater than 1 cm lipoma of the quadrigeminal plate cistern, eccentric to the RIGHT. Vascular: Calcification of the cavernous internal carotid arteries consistent with cerebrovascular atherosclerotic disease. No signs of intracranial large vessel occlusion. Skull: No fracture. Other: LEFT frontal scalp hematoma, possible laceration. No foreign body. CT MAXILLOFACIAL FINDINGS Osseous: No fracture or mandibular dislocation. No destructive process. Orbits: BILATERAL cataract extraction. No orbital hematoma. Sinuses: Dense LEFT maxillary sinus secretions, likely inspissated rather than hemosinus. Slight layering RIGHT maxillary sinus secretions. No significant layering fluid in the frontal or ethmoid sinuses. Layering LEFT division sphenoid sinus fluid, without features of basilar skull fracture. Soft tissues: Unremarkable. CT CERVICAL SPINE FINDINGS Alignment: Reversal of the normal cervical lordosis, and degenerative slip C3-4 and C4-5 unchanged from priors.Degenerative scoliosis convex RIGHT. Skull base and vertebrae: No acute fracture. Chronic appearing C1 arch deformity. Soft tissues and spinal canal: Significant pannus surrounds the odontoid, with moderate upper cervical narrowing, but unchanged from October. No canal hematoma. Disc levels: Chronic spondylosis. No posttraumatic disc protrusion is evident. Upper chest: Dolichoectatic vasculature. No upper rib fracture or pneumothorax. Other: Airway appears patent. IMPRESSION: 1. Motion degraded exam demonstrating mild subarachnoid hemorrhage over the frontal  convexity, with a suspected punctate subcortical shearing injury RIGHT frontal lobe. Small volume layering intraventricular blood. No significant subdural collection or midline shift. 2. Advanced atrophy and chronic microvascular ischemic change. 3. No facial fracture or blowout injury. Chronic appearing sinus disease. 4. LEFT frontal scalp hematoma, possible laceration. No foreign body. 5. No cervical spine fracture or traumatic subluxation. Chronic spondylosis. 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 6. EDP was unavailable to take the call report, and the finding of small volume intracranial blood was relayed to the ED staff. Electronically Signed   By: Staci Righter M.D.   On: 09/25/2018 12:07   Ct Cervical Spine Wo Contrast  Result Date: 09/25/2018 CLINICAL DATA:  Unwitnessed fall. Combative and nonverbal. EXAM: CT HEAD WITHOUT CONTRAST CT MAXILLOFACIAL WITHOUT CONTRAST CT CERVICAL SPINE WITHOUT  CONTRAST TECHNIQUE: Multidetector CT imaging of the head, cervical spine, and maxillofacial structures were performed using the standard protocol without intravenous contrast. Multiplanar CT image reconstructions of the cervical spine and maxillofacial structures were also generated. COMPARISON:  07/30/2018. FINDINGS: The patient was unable to remain motionless for the exam. Small or subtle lesions could be overlooked. CT HEAD FINDINGS Brain: Advanced atrophy with chronic microvascular ischemic change. Layering intraventricular hemorrhage affecting the RIGHT greater than LEFT lateral ventricles. Mild subarachnoid hemorrhage over the frontal convexity, with a suspected punctate subcortical shearing injury RIGHT frontal lobe, series 3, image 27. No significant subdural collection or midline shift. Slightly greater than 1 cm lipoma of the quadrigeminal plate cistern, eccentric to the RIGHT. Vascular: Calcification of the cavernous internal carotid arteries consistent with cerebrovascular  atherosclerotic disease. No signs of intracranial large vessel occlusion. Skull: No fracture. Other: LEFT frontal scalp hematoma, possible laceration. No foreign body. CT MAXILLOFACIAL FINDINGS Osseous: No fracture or mandibular dislocation. No destructive process. Orbits: BILATERAL cataract extraction. No orbital hematoma. Sinuses: Dense LEFT maxillary sinus secretions, likely inspissated rather than hemosinus. Slight layering RIGHT maxillary sinus secretions. No significant layering fluid in the frontal or ethmoid sinuses. Layering LEFT division sphenoid sinus fluid, without features of basilar skull fracture. Soft tissues: Unremarkable. CT CERVICAL SPINE FINDINGS Alignment: Reversal of the normal cervical lordosis, and degenerative slip C3-4 and C4-5 unchanged from priors.Degenerative scoliosis convex RIGHT. Skull base and vertebrae: No acute fracture. Chronic appearing C1 arch deformity. Soft tissues and spinal canal: Significant pannus surrounds the odontoid, with moderate upper cervical narrowing, but unchanged from October. No canal hematoma. Disc levels: Chronic spondylosis. No posttraumatic disc protrusion is evident. Upper chest: Dolichoectatic vasculature. No upper rib fracture or pneumothorax. Other: Airway appears patent. IMPRESSION: 1. Motion degraded exam demonstrating mild subarachnoid hemorrhage over the frontal convexity, with a suspected punctate subcortical shearing injury RIGHT frontal lobe. Small volume layering intraventricular blood. No significant subdural collection or midline shift. 2. Advanced atrophy and chronic microvascular ischemic change. 3. No facial fracture or blowout injury. Chronic appearing sinus disease. 4. LEFT frontal scalp hematoma, possible laceration. No foreign body. 5. No cervical spine fracture or traumatic subluxation. Chronic spondylosis. 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 6. EDP was unavailable to take the call report, and the  finding of small volume intracranial blood was relayed to the ED staff. Electronically Signed   By: Staci Righter M.D.   On: 09/25/2018 12:07   Dg Femur Min 2 Views Left  Result Date: 09/25/2018 CLINICAL DATA:  Pain after trauma EXAM: LEFT FEMUR 2 VIEWS COMPARISON:  None. FINDINGS: There is no evidence of fracture or other focal bone lesions. Soft tissues are unremarkable. IMPRESSION: Negative. Electronically Signed   By: Dorise Bullion III M.D   On: 09/25/2018 11:51   Ct Maxillofacial Wo Contrast  Result Date: 09/25/2018 CLINICAL DATA:  Unwitnessed fall. Combative and nonverbal. EXAM: CT HEAD WITHOUT CONTRAST CT MAXILLOFACIAL WITHOUT CONTRAST CT CERVICAL SPINE WITHOUT CONTRAST TECHNIQUE: Multidetector CT imaging of the head, cervical spine, and maxillofacial structures were performed using the standard protocol without intravenous contrast. Multiplanar CT image reconstructions of the cervical spine and maxillofacial structures were also generated. COMPARISON:  07/30/2018. FINDINGS: The patient was unable to remain motionless for the exam. Small or subtle lesions could be overlooked. CT HEAD FINDINGS Brain: Advanced atrophy with chronic microvascular ischemic change. Layering intraventricular hemorrhage affecting the RIGHT greater than LEFT lateral ventricles. Mild subarachnoid hemorrhage over the frontal convexity, with a suspected  punctate subcortical shearing injury RIGHT frontal lobe, series 3, image 27. No significant subdural collection or midline shift. Slightly greater than 1 cm lipoma of the quadrigeminal plate cistern, eccentric to the RIGHT. Vascular: Calcification of the cavernous internal carotid arteries consistent with cerebrovascular atherosclerotic disease. No signs of intracranial large vessel occlusion. Skull: No fracture. Other: LEFT frontal scalp hematoma, possible laceration. No foreign body. CT MAXILLOFACIAL FINDINGS Osseous: No fracture or mandibular dislocation. No destructive  process. Orbits: BILATERAL cataract extraction. No orbital hematoma. Sinuses: Dense LEFT maxillary sinus secretions, likely inspissated rather than hemosinus. Slight layering RIGHT maxillary sinus secretions. No significant layering fluid in the frontal or ethmoid sinuses. Layering LEFT division sphenoid sinus fluid, without features of basilar skull fracture. Soft tissues: Unremarkable. CT CERVICAL SPINE FINDINGS Alignment: Reversal of the normal cervical lordosis, and degenerative slip C3-4 and C4-5 unchanged from priors.Degenerative scoliosis convex RIGHT. Skull base and vertebrae: No acute fracture. Chronic appearing C1 arch deformity. Soft tissues and spinal canal: Significant pannus surrounds the odontoid, with moderate upper cervical narrowing, but unchanged from October. No canal hematoma. Disc levels: Chronic spondylosis. No posttraumatic disc protrusion is evident. Upper chest: Dolichoectatic vasculature. No upper rib fracture or pneumothorax. Other: Airway appears patent. IMPRESSION: 1. Motion degraded exam demonstrating mild subarachnoid hemorrhage over the frontal convexity, with a suspected punctate subcortical shearing injury RIGHT frontal lobe. Small volume layering intraventricular blood. No significant subdural collection or midline shift. 2. Advanced atrophy and chronic microvascular ischemic change. 3. No facial fracture or blowout injury. Chronic appearing sinus disease. 4. LEFT frontal scalp hematoma, possible laceration. No foreign body. 5. No cervical spine fracture or traumatic subluxation. Chronic spondylosis. 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 6. EDP was unavailable to take the call report, and the finding of small volume intracranial blood was relayed to the ED staff. Electronically Signed   By: Staci Righter M.D.   On: 09/25/2018 12:07    EKG: Independently reviewed.  No STEMI  Assessment/Plan Present on Admission: . Hypothyroidism . Dementia  ([place]) . Subarachnoid hemorrhage following injury with brief loss of consciousness but without open intracranial wound ([place]) . Accelerated hypertension . Muscle weakness (generalized)  Principal Problem:   Subarachnoid hemorrhage following injury with brief loss of consciousness but without open intracranial wound ([place]) Active Problems:   Hypothyroidism   Dementia ([place])   Fall at home, initial encounter   Accelerated hypertension   Muscle weakness (generalized)   Subarachnoid hemorrhage ([place])  1.  Fall at nursing home unknown downtime.  2.  Subarachnoid hemorrhage small secondary to fall.  Neurosurgery was consulted Dr. Venetia Constable he did not recommend repeat CT or angiogram.  He will evaluated patient and recommended admission overnight to monitor for neurological symptoms  3.  Dementia at baseline  4.  Multiple contusion with no fractures noted  5.  Hematoma of the left frontal and temporal region with pressure dressing continue pressure dressing    Severity of Illness: The appropriate patient status for this patient is OBSERVATION. Observation status is judged to be reasonable and necessary in order to provide the required intensity of service to ensure the patient's safety. The patient's presenting symptoms, physical exam findings, and initial radiographic and laboratory data in the context of their medical condition is felt to place them at decreased risk for further clinical deterioration. Furthermore, it is anticipated that the patient will be medically stable for discharge from the hospital within 2 midnights of admission. The following factors support the patient status  of observation.   " The patient's presenting symptoms include fall unknown time. " The physical exam findings include subarachnoid hemorrhage. " The initial radiographic and laboratory data are arachnoid hemorrhage.     DVT prophylaxis: SCD  Code Status: DNR  Family Communication: Nephew at bedside who  is power of attorney  Disposition Plan: Home after 24 hours of observation  Consults called: Neurosurgery  Admission status: Observation possible discharge in the morning if stable    Cristal Deer MD [facility] Pager [phone]  If 7PM-7AM, please contact night-coverage [email or web address] Password [email or web address]  09/25/2018, 2:45 PM

## 2018-09-25 NOTE — Consult Note (Signed)
Neurosurgery Consultation  Reason for Consult: traumatic brain injury Referring Physician: Kyung Bacca  CC: fall  HPI: This is a 84 y.o. woman that presents after a fall with confusion. She lives at a facility for patients with dementia. By report, at baseline she is very confused but verbal. She was less responsive than her baseline so she was taken to the ED. Given her depressed mental status, no further history is available at this time. By report, the patient is not on anti-platelet or anti-coagulant medications.   ROS: A 14 point ROS was performed and is negative except as noted in the HPI, but is limited due to the patient's depressed mental status.    PMHx:  Past Medical History:  Diagnosis Date  . Age-related osteoporosis without current pathological fracture   . Dementia ([place])   . Dysphagia   . Fracture of first cervical vertebra (HCC)   . Hypothyroidism   . Insomnia   . Major depression, single episode   . Muscle weakness (generalized)    FamHx: No family history on file. SocHx:  reports previous alcohol use. She reports previous drug use. No history on file for tobacco.  Exam: Vital signs in last 24 hours: Temp:  [98.4 F (36.9 C)] 98.4 F (36.9 C) (12/14 1636) Pulse Rate:  [45-123] 87 (12/14 1636) Resp:  [15-31] 22 (12/14 1636) BP: (105-206)/(44-135) 154/71 (12/14 1636) SpO2:  [86 %-100 %] 100 % (12/14 1636) General: Awake, alert, lying in bed in NAD, appears chronically ill Head: normocephalic, L frontal scalp injury with pressure dressing  HEENT: wearing rigid cervical collar Pulmonary: breathing 2L by [place] comfortably, no evidence of increased work of breathing Psych: flat affect, non-reactive affect Abdomen: S NT ND Extremities: warm and well perfused, R wrist casted Neuro: Eyes open spontaneously, not FC, gaze neutral,  PERRL, MAEx4 spontaneously, limited at R wrist due to cast   Assessment and Plan: 84 y.o. woman s/p fall with traumatic brain injury. CT head  personally reviewed, which shows quadrigeminal plate lipoma, small area of IVH, small areas of tSAH.  -no acute neurosurgical intervention indicated at this time -recommend observation overnight to monitor neurologic exam, no scheduled repeat imaging if patient's exam remains stable -please call with any concerns or questions  Judith Part, MD 09/25/18 5:23 PM [facility]

## 2018-09-25 NOTE — ED Notes (Signed)
Attempted to call report at this time.  No RN came to phone.

## 2018-09-25 NOTE — ED Triage Notes (Signed)
Arrives from [facility], unwitnessed fall with AMS from baseline, unknown LKW. Normally verbal with gaze in one direction, now looking in two directions and nonverbal Skin tear with bleeding on L side of head, controlled with bandaging by EMS. 12 lead unremarkable BP 230/86, pulse 74, RR 22, o2 100 CBG 186

## 2018-09-25 NOTE — ED Provider Notes (Signed)
[facility] EMERGENCY DEPARTMENT Provider Note   CSN: [id number] Arrival date & time: 09/25/18  9326     History   Chief Complaint Chief Complaint  Patient presents with  . Fall    HPI Julia Carey is a 84 y.o. female.  HPI 84 year old female, the PMH of dementia, presents from nursing facility.  Patient was found down this morning.  At baseline patient is confused but talkative.  Patient has obvious trauma to the head and left elbow.  They are unsure how long she was down. Pt unable to give any HPI at this time.  Past Medical History:  Diagnosis Date  . Age-related osteoporosis without current pathological fracture   . Dementia ([place])   . Dysphagia   . Fracture of first cervical vertebra (HCC)   . Hypothyroidism   . Insomnia   . Major depression, single episode   . Muscle weakness (generalized)     There are no active problems to display for this patient.   No past surgical history on file.   OB History   No obstetric history on file.      Home Medications    Prior to Admission medications   Medication Sig Start Date End Date Taking? Authorizing Provider  acetaminophen (TYLENOL) 325 MG tablet Take 325 mg by mouth 3 (three) times daily.    Yes [provider]  aspirin 81 MG tablet Take 81 mg by mouth every other day. Every 2 days    Yes [provider]  Carboxymeth-Glycerin-Polysorb (REFRESH OPTIVE ADVANCED) 0.5-1-0.5 % SOLN Place 1 drop into both eyes 4 (four) times daily.   Yes [provider]  cetirizine (ZYRTEC) 10 MG tablet Take 10 mg by mouth daily.   Yes [provider]  donepezil (ARICEPT) 10 MG tablet Take 10 mg by mouth at bedtime.    Yes [provider]  DULoxetine (CYMBALTA) 30 MG capsule Take 30 mg by mouth daily.   Yes [provider]  Eyelid Cleansers (OCUSOFT EYELID CLEANSING EX) Apply 1 application topically at bedtime.   Yes [provider]  fluticasone (FLONASE)  50 MCG/ACT nasal spray Place 1 spray into both nostrils daily.   Yes [provider]  levothyroxine (SYNTHROID, LEVOTHROID) 50 MCG tablet Take 50 mcg by mouth daily before breakfast.   Yes [provider]  losartan (COZAAR) 50 MG tablet Take 50 mg by mouth daily.   Yes [provider]  Melatonin 3 MG TABS Take 3 mg by mouth at bedtime.   Yes [provider]  methocarbamol (ROBAXIN) 500 MG tablet Take 500 mg by mouth every 12 (twelve) hours as needed for muscle spasms.   Yes [provider]  metoprolol tartrate (LOPRESSOR) 25 MG tablet Take 25 mg by mouth every morning.    Yes [provider]  omeprazole (PRILOSEC) 20 MG capsule Take 20 mg by mouth daily as needed (acid reflux).    Yes [provider]  polyethylene glycol (MIRALAX / GLYCOLAX) packet Take 17 g by mouth daily.   Yes [provider]  vitamin B-12 (CYANOCOBALAMIN) 1000 MCG tablet Take 1,000 mcg by mouth every other day. Every 2 days    Yes [provider]    Family History No family history on file.  Social History Social History   Tobacco Use  . Smoking status: Unknown If Ever Smoked  Substance Use Topics  . Alcohol use: Not Currently  . Drug use: Not Currently  Allergies   Amoxicillin; Clindamycin/lincomycin; and Keflex [cephalexin]   Review of Systems Review of Systems  Unable to perform ROS: Dementia     Physical Exam Updated Vital Signs BP (!) 156/85   Pulse 98   Resp (!) 23   SpO2 96%   Physical Exam Vitals signs and nursing note reviewed.  Constitutional:      Interventions: Cervical collar in place.  HENT:     Head: Normocephalic and atraumatic.      Nose:     Comments: Dried blood noted to bilateral nares Eyes:     Pupils: Pupils are equal, round, and reactive to light.  Neck:     Comments: In cervical collar Cardiovascular:     Rate and Rhythm: Normal rate and regular rhythm.     Heart sounds: Normal heart  sounds. No murmur.  Pulmonary:     Effort: Pulmonary effort is normal. No respiratory distress.     Breath sounds: Normal breath sounds. No wheezing or rales.  Abdominal:     General: Bowel sounds are normal. There is no distension.     Palpations: Abdomen is soft.     Tenderness: There is no abdominal tenderness.  Musculoskeletal: Normal range of motion.        General: No tenderness or deformity.       Arms:       Legs:  Skin:    General: Skin is warm and dry.     Findings: No erythema or rash.  Neurological:     Mental Status: She is oriented to person, place, and time and easily aroused.  Psychiatric:        Behavior: Behavior normal.      ED Treatments / Results  Labs (all labs ordered are listed, but only abnormal results are displayed) Labs Reviewed  COMPREHENSIVE METABOLIC PANEL - Abnormal; Notable for the following components:      Result Value   Glucose, Bld 150 (*)    Creatinine, Ser 1.30 (*)    GFR calc non Af Amer 37 (*)    GFR calc Af Amer 42 (*)    All other components within normal limits  CBC - Abnormal; Notable for the following components:   RBC 3.82 (*)    Hemoglobin 11.9 (*)    MCV 102.9 (*)    All other components within normal limits  CK  URINALYSIS, ROUTINE W REFLEX MICROSCOPIC  I-STAT CG4 LACTIC ACID, ED  I-STAT TROPONIN, ED  CBG MONITORING, ED    EKG EKG Interpretation  Date/Time:  Saturday September 25 2018 10:05:56 EST Ventricular Rate:  123 PR Interval:    QRS Duration: 97 QT Interval:  319 QTC Calculation: 457 R Axis:   38 Text Interpretation:  Atrial fibrillation LVH with secondary repolarization abnormality ST depression, probably rate related No previous ECGs available Confirmed by Wandra Arthurs [phone]) on 09/25/2018 10:31:14 AM Also confirmed by Wandra Arthurs [phone]), editor Lynder Parents [phone])  on 09/25/2018 11:22:21 AM   Radiology Dg Chest 1 View  Result Date: 09/25/2018 CLINICAL DATA:  Pain after fall. EXAM: CHEST   1 VIEW COMPARISON:  None. FINDINGS: The heart, hila, and mediastinum are normal. No pneumothorax. No pulmonary nodules or masses. No focal infiltrates identified. IMPRESSION: No active disease. Electronically Signed   By: Dorise Bullion III M.D   On: 09/25/2018 11:49   Dg Pelvis 1-2 Views  Result Date: 09/25/2018 CLINICAL DATA:  Pain after fall EXAM: PELVIS - 1-2 VIEW COMPARISON:  None.  FINDINGS: There is no evidence of pelvic fracture or diastasis. No pelvic bone lesions are seen. IMPRESSION: Negative. Electronically Signed   By: Dorise Bullion III M.D   On: 09/25/2018 11:52   Ct Head Wo Contrast  Result Date: 09/25/2018 CLINICAL DATA:  Unwitnessed fall. Combative and nonverbal. EXAM: CT HEAD WITHOUT CONTRAST CT MAXILLOFACIAL WITHOUT CONTRAST CT CERVICAL SPINE WITHOUT CONTRAST TECHNIQUE: Multidetector CT imaging of the head, cervical spine, and maxillofacial structures were performed using the standard protocol without intravenous contrast. Multiplanar CT image reconstructions of the cervical spine and maxillofacial structures were also generated. COMPARISON:  07/30/2018. FINDINGS: The patient was unable to remain motionless for the exam. Small or subtle lesions could be overlooked. CT HEAD FINDINGS Brain: Advanced atrophy with chronic microvascular ischemic change. Layering intraventricular hemorrhage affecting the RIGHT greater than LEFT lateral ventricles. Mild subarachnoid hemorrhage over the frontal convexity, with a suspected punctate subcortical shearing injury RIGHT frontal lobe, series 3, image 27. No significant subdural collection or midline shift. Slightly greater than 1 cm lipoma of the quadrigeminal plate cistern, eccentric to the RIGHT. Vascular: Calcification of the cavernous internal carotid arteries consistent with cerebrovascular atherosclerotic disease. No signs of intracranial large vessel occlusion. Skull: No fracture. Other: LEFT frontal scalp hematoma, possible laceration. No  foreign body. CT MAXILLOFACIAL FINDINGS Osseous: No fracture or mandibular dislocation. No destructive process. Orbits: BILATERAL cataract extraction. No orbital hematoma. Sinuses: Dense LEFT maxillary sinus secretions, likely inspissated rather than hemosinus. Slight layering RIGHT maxillary sinus secretions. No significant layering fluid in the frontal or ethmoid sinuses. Layering LEFT division sphenoid sinus fluid, without features of basilar skull fracture. Soft tissues: Unremarkable. CT CERVICAL SPINE FINDINGS Alignment: Reversal of the normal cervical lordosis, and degenerative slip C3-4 and C4-5 unchanged from priors.Degenerative scoliosis convex RIGHT. Skull base and vertebrae: No acute fracture. Chronic appearing C1 arch deformity. Soft tissues and spinal canal: Significant pannus surrounds the odontoid, with moderate upper cervical narrowing, but unchanged from October. No canal hematoma. Disc levels: Chronic spondylosis. No posttraumatic disc protrusion is evident. Upper chest: Dolichoectatic vasculature. No upper rib fracture or pneumothorax. Other: Airway appears patent. IMPRESSION: 1. Motion degraded exam demonstrating mild subarachnoid hemorrhage over the frontal convexity, with a suspected punctate subcortical shearing injury RIGHT frontal lobe. Small volume layering intraventricular blood. No significant subdural collection or midline shift. 2. Advanced atrophy and chronic microvascular ischemic change. 3. No facial fracture or blowout injury. Chronic appearing sinus disease. 4. LEFT frontal scalp hematoma, possible laceration. No foreign body. 5. No cervical spine fracture or traumatic subluxation. Chronic spondylosis. 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 6. EDP was unavailable to take the call report, and the finding of small volume intracranial blood was relayed to the ED staff. Electronically Signed   By: Staci Righter M.D.   On: 09/25/2018 12:07   Ct  Cervical Spine Wo Contrast  Result Date: 09/25/2018 CLINICAL DATA:  Unwitnessed fall. Combative and nonverbal. EXAM: CT HEAD WITHOUT CONTRAST CT MAXILLOFACIAL WITHOUT CONTRAST CT CERVICAL SPINE WITHOUT CONTRAST TECHNIQUE: Multidetector CT imaging of the head, cervical spine, and maxillofacial structures were performed using the standard protocol without intravenous contrast. Multiplanar CT image reconstructions of the cervical spine and maxillofacial structures were also generated. COMPARISON:  07/30/2018. FINDINGS: The patient was unable to remain motionless for the exam. Small or subtle lesions could be overlooked. CT HEAD FINDINGS Brain: Advanced atrophy with chronic microvascular ischemic change. Layering intraventricular hemorrhage affecting the RIGHT greater than LEFT lateral ventricles. Mild subarachnoid hemorrhage over  the frontal convexity, with a suspected punctate subcortical shearing injury RIGHT frontal lobe, series 3, image 27. No significant subdural collection or midline shift. Slightly greater than 1 cm lipoma of the quadrigeminal plate cistern, eccentric to the RIGHT. Vascular: Calcification of the cavernous internal carotid arteries consistent with cerebrovascular atherosclerotic disease. No signs of intracranial large vessel occlusion. Skull: No fracture. Other: LEFT frontal scalp hematoma, possible laceration. No foreign body. CT MAXILLOFACIAL FINDINGS Osseous: No fracture or mandibular dislocation. No destructive process. Orbits: BILATERAL cataract extraction. No orbital hematoma. Sinuses: Dense LEFT maxillary sinus secretions, likely inspissated rather than hemosinus. Slight layering RIGHT maxillary sinus secretions. No significant layering fluid in the frontal or ethmoid sinuses. Layering LEFT division sphenoid sinus fluid, without features of basilar skull fracture. Soft tissues: Unremarkable. CT CERVICAL SPINE FINDINGS Alignment: Reversal of the normal cervical lordosis, and degenerative  slip C3-4 and C4-5 unchanged from priors.Degenerative scoliosis convex RIGHT. Skull base and vertebrae: No acute fracture. Chronic appearing C1 arch deformity. Soft tissues and spinal canal: Significant pannus surrounds the odontoid, with moderate upper cervical narrowing, but unchanged from October. No canal hematoma. Disc levels: Chronic spondylosis. No posttraumatic disc protrusion is evident. Upper chest: Dolichoectatic vasculature. No upper rib fracture or pneumothorax. Other: Airway appears patent. IMPRESSION: 1. Motion degraded exam demonstrating mild subarachnoid hemorrhage over the frontal convexity, with a suspected punctate subcortical shearing injury RIGHT frontal lobe. Small volume layering intraventricular blood. No significant subdural collection or midline shift. 2. Advanced atrophy and chronic microvascular ischemic change. 3. No facial fracture or blowout injury. Chronic appearing sinus disease. 4. LEFT frontal scalp hematoma, possible laceration. No foreign body. 5. No cervical spine fracture or traumatic subluxation. Chronic spondylosis. 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 6. EDP was unavailable to take the call report, and the finding of small volume intracranial blood was relayed to the ED staff. Electronically Signed   By: Staci Righter M.D.   On: 09/25/2018 12:07   Dg Femur Min 2 Views Left  Result Date: 09/25/2018 CLINICAL DATA:  Pain after trauma EXAM: LEFT FEMUR 2 VIEWS COMPARISON:  None. FINDINGS: There is no evidence of fracture or other focal bone lesions. Soft tissues are unremarkable. IMPRESSION: Negative. Electronically Signed   By: Dorise Bullion III M.D   On: 09/25/2018 11:51   Ct Maxillofacial Wo Contrast  Result Date: 09/25/2018 CLINICAL DATA:  Unwitnessed fall. Combative and nonverbal. EXAM: CT HEAD WITHOUT CONTRAST CT MAXILLOFACIAL WITHOUT CONTRAST CT CERVICAL SPINE WITHOUT CONTRAST TECHNIQUE: Multidetector CT imaging of the head,  cervical spine, and maxillofacial structures were performed using the standard protocol without intravenous contrast. Multiplanar CT image reconstructions of the cervical spine and maxillofacial structures were also generated. COMPARISON:  07/30/2018. FINDINGS: The patient was unable to remain motionless for the exam. Small or subtle lesions could be overlooked. CT HEAD FINDINGS Brain: Advanced atrophy with chronic microvascular ischemic change. Layering intraventricular hemorrhage affecting the RIGHT greater than LEFT lateral ventricles. Mild subarachnoid hemorrhage over the frontal convexity, with a suspected punctate subcortical shearing injury RIGHT frontal lobe, series 3, image 27. No significant subdural collection or midline shift. Slightly greater than 1 cm lipoma of the quadrigeminal plate cistern, eccentric to the RIGHT. Vascular: Calcification of the cavernous internal carotid arteries consistent with cerebrovascular atherosclerotic disease. No signs of intracranial large vessel occlusion. Skull: No fracture. Other: LEFT frontal scalp hematoma, possible laceration. No foreign body. CT MAXILLOFACIAL FINDINGS Osseous: No fracture or mandibular dislocation. No destructive process. Orbits: BILATERAL cataract extraction. No  orbital hematoma. Sinuses: Dense LEFT maxillary sinus secretions, likely inspissated rather than hemosinus. Slight layering RIGHT maxillary sinus secretions. No significant layering fluid in the frontal or ethmoid sinuses. Layering LEFT division sphenoid sinus fluid, without features of basilar skull fracture. Soft tissues: Unremarkable. CT CERVICAL SPINE FINDINGS Alignment: Reversal of the normal cervical lordosis, and degenerative slip C3-4 and C4-5 unchanged from priors.Degenerative scoliosis convex RIGHT. Skull base and vertebrae: No acute fracture. Chronic appearing C1 arch deformity. Soft tissues and spinal canal: Significant pannus surrounds the odontoid, with moderate upper cervical  narrowing, but unchanged from October. No canal hematoma. Disc levels: Chronic spondylosis. No posttraumatic disc protrusion is evident. Upper chest: Dolichoectatic vasculature. No upper rib fracture or pneumothorax. Other: Airway appears patent. IMPRESSION: 1. Motion degraded exam demonstrating mild subarachnoid hemorrhage over the frontal convexity, with a suspected punctate subcortical shearing injury RIGHT frontal lobe. Small volume layering intraventricular blood. No significant subdural collection or midline shift. 2. Advanced atrophy and chronic microvascular ischemic change. 3. No facial fracture or blowout injury. Chronic appearing sinus disease. 4. LEFT frontal scalp hematoma, possible laceration. No foreign body. 5. No cervical spine fracture or traumatic subluxation. Chronic spondylosis. 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 6. EDP was unavailable to take the call report, and the finding of small volume intracranial blood was relayed to the ED staff. Electronically Signed   By: Staci Righter M.D.   On: 09/25/2018 12:07    Procedures .Critical Care Performed by: Etter Sjogren, PA-C Authorized by: Etter Sjogren, PA-C   Critical care provider statement:    Critical care time (minutes):  45   Critical care was necessary to treat or prevent imminent or life-threatening deterioration of the following conditions:  Cardiac failure, circulatory failure, CNS failure or compromise, respiratory failure and trauma   Critical care was time spent personally by me on the following activities:  Discussions with consultants, evaluation of patient's response to treatment, examination of patient, ordering and performing treatments and interventions, ordering and review of laboratory studies, ordering and review of radiographic studies, pulse oximetry, re-evaluation of patient's condition, obtaining history from patient or surrogate and review of old charts    (including critical care time)  Medications Ordered in ED Medications  clevidipine (CLEVIPREX) infusion 0.5 mg/mL (has no administration in time range)  sodium chloride 0.9 % bolus 1,000 mL (0 mLs Intravenous Stopped 09/25/18 1043)  hydrALAZINE (APRESOLINE) injection 5 mg (5 mg Intravenous Given 09/25/18 0928)  lidocaine-EPINEPHrine (XYLOCAINE W/EPI) 2 %-1:200000 (PF) injection 10 mL (10 mLs Infiltration Given 09/25/18 0940)  LORazepam (ATIVAN) injection 1 mg (1 mg Intravenous Given 09/25/18 1000)  LORazepam (ATIVAN) injection 1 mg (1 mg Intravenous Given 09/25/18 1035)     Initial Impression / Assessment and Plan / ED Course  I have reviewed the triage vital signs and the nursing notes.  Pertinent labs & imaging results that were available during my care of the patient were reviewed by me and considered in my medical decision making (see chart for details).    Provider called to bedside due to increased bleeding from skin tear on the left temple.  Pressure was applied and unable to obtain hemostasis from this.  Attempted to apply quick clot and pressure.  Patient very agitated and unable to sit still for procedure.  Patient was given 1 mg of Ativan IV after discussion with attending, Dr. Darl Householder.  Attempted to place a figure-of-eight suture, unsuccessful.  Gauze applied along with pressure dressing.  CT head shows subarachnoid hemorrhage.  Will consult neurosurgery.  Patient remains slightly hypertensive with systolic in the 740C. Cleviprex was ordered in the ED.  12:58 PM Discussed with neurosurgeon on-call, Dr. Zada Finders.  He is agreeable with Cleviprex.  He will consult and see the patient in the ED.  He does not recommend a repeat CT or CT angios head neck at this time. Patient reevaluated.  She remains agitated, responds to verbal stimuli.  She is still not talking much.  Gauze are still in place over hematoma of the left frontal and temporal regions.  Gauze are not soaked.  Will leave  in place. Will admit to the hospitalist.  Final Clinical Impressions(s) / ED Diagnoses   Final diagnoses:  Subarachnoid hemorrhage ([place])  Hematoma of frontal scalp, initial encounter    ED Discharge Orders    None       Rachel Moulds 09/26/18 1151    Drenda Freeze, MD 09/28/18 1153

## 2018-09-25 NOTE — Progress Notes (Signed)
Patient arrived to 3w-23. Family at bedside. Pt is lethargic and not alert. Patient is on 2 liters [place]. Skin tear on L elbow, pink foam dressing applied. Large L hip bruise. Left eye is swollen with bruises. Skin tear on left side of forehead that has gauze applied from ED. Patient has R arm cast from a fall 3 to 4 weeks ago. Neck brace applied from ED. Tele has been placed. Nurse will continue to monitor. [place]

## 2018-09-26 ENCOUNTER — Other Ambulatory Visit: Payer: Self-pay

## 2018-09-26 DIAGNOSIS — M6281 Muscle weakness (generalized): Secondary | ICD-10-CM | POA: Diagnosis present

## 2018-09-26 DIAGNOSIS — M810 Age-related osteoporosis without current pathological fracture: Secondary | ICD-10-CM | POA: Diagnosis present

## 2018-09-26 DIAGNOSIS — I10 Essential (primary) hypertension: Secondary | ICD-10-CM | POA: Diagnosis present

## 2018-09-26 DIAGNOSIS — I609 Nontraumatic subarachnoid hemorrhage, unspecified: Secondary | ICD-10-CM | POA: Diagnosis present

## 2018-09-26 DIAGNOSIS — S50312A Abrasion of left elbow, initial encounter: Secondary | ICD-10-CM | POA: Diagnosis present

## 2018-09-26 DIAGNOSIS — S0181XA Laceration without foreign body of other part of head, initial encounter: Secondary | ICD-10-CM | POA: Diagnosis present

## 2018-09-26 DIAGNOSIS — R1310 Dysphagia, unspecified: Secondary | ICD-10-CM | POA: Diagnosis present

## 2018-09-26 DIAGNOSIS — Z7951 Long term (current) use of inhaled steroids: Secondary | ICD-10-CM | POA: Diagnosis not present

## 2018-09-26 DIAGNOSIS — Z66 Do not resuscitate: Secondary | ICD-10-CM | POA: Diagnosis present

## 2018-09-26 DIAGNOSIS — Z881 Allergy status to other antibiotic agents status: Secondary | ICD-10-CM | POA: Diagnosis not present

## 2018-09-26 DIAGNOSIS — M069 Rheumatoid arthritis, unspecified: Secondary | ICD-10-CM | POA: Diagnosis present

## 2018-09-26 DIAGNOSIS — N390 Urinary tract infection, site not specified: Secondary | ICD-10-CM | POA: Diagnosis present

## 2018-09-26 DIAGNOSIS — Z79899 Other long term (current) drug therapy: Secondary | ICD-10-CM | POA: Diagnosis not present

## 2018-09-26 DIAGNOSIS — B9620 Unspecified Escherichia coli [E. coli] as the cause of diseases classified elsewhere: Secondary | ICD-10-CM | POA: Diagnosis present

## 2018-09-26 DIAGNOSIS — G9349 Other encephalopathy: Secondary | ICD-10-CM | POA: Diagnosis present

## 2018-09-26 DIAGNOSIS — F0390 Unspecified dementia without behavioral disturbance: Secondary | ICD-10-CM | POA: Diagnosis present

## 2018-09-26 DIAGNOSIS — Z7982 Long term (current) use of aspirin: Secondary | ICD-10-CM | POA: Diagnosis not present

## 2018-09-26 DIAGNOSIS — W19XXXA Unspecified fall, initial encounter: Secondary | ICD-10-CM | POA: Diagnosis present

## 2018-09-26 DIAGNOSIS — D179 Benign lipomatous neoplasm, unspecified: Secondary | ICD-10-CM | POA: Diagnosis present

## 2018-09-26 DIAGNOSIS — Y92129 Unspecified place in nursing home as the place of occurrence of the external cause: Secondary | ICD-10-CM | POA: Diagnosis not present

## 2018-09-26 DIAGNOSIS — Z79890 Hormone replacement therapy (postmenopausal): Secondary | ICD-10-CM | POA: Diagnosis not present

## 2018-09-26 DIAGNOSIS — S066X9A Traumatic subarachnoid hemorrhage with loss of consciousness of unspecified duration, initial encounter: Principal | ICD-10-CM

## 2018-09-26 DIAGNOSIS — R4189 Other symptoms and signs involving cognitive functions and awareness: Secondary | ICD-10-CM | POA: Diagnosis present

## 2018-09-26 DIAGNOSIS — G4700 Insomnia, unspecified: Secondary | ICD-10-CM | POA: Diagnosis present

## 2018-09-26 DIAGNOSIS — Y92009 Unspecified place in unspecified non-institutional (private) residence as the place of occurrence of the external cause: Secondary | ICD-10-CM

## 2018-09-26 DIAGNOSIS — E039 Hypothyroidism, unspecified: Secondary | ICD-10-CM | POA: Diagnosis present

## 2018-09-26 LAB — BASIC METABOLIC PANEL
Anion gap: 12 (ref 5–15)
BUN: 20 mg/dL (ref 8–23)
CHLORIDE: 106 mmol/L (ref 98–111)
CO2: 22 mmol/L (ref 22–32)
Calcium: 9.8 mg/dL (ref 8.9–10.3)
Creatinine, Ser: 1.12 mg/dL — ABNORMAL HIGH (ref 0.44–1.00)
GFR calc Af Amer: 51 mL/min — ABNORMAL LOW (ref >60)
GFR calc non Af Amer: 44 mL/min — ABNORMAL LOW (ref >60)
Glucose, Bld: 105 mg/dL — ABNORMAL HIGH (ref 70–99)
Potassium: 4.1 mmol/L (ref 3.5–5.1)
Sodium: 140 mmol/L (ref 135–145)

## 2018-09-26 LAB — MRSA PCR SCREENING: MRSA by PCR: NEGATIVE

## 2018-09-26 LAB — GLUCOSE, CAPILLARY: Glucose-Capillary: 101 mg/dL — ABNORMAL HIGH (ref 70–99)

## 2018-09-26 MED ORDER — DEXTROSE-NACL 5-0.45 % IV SOLN
INTRAVENOUS | Status: DC
  Administered 2018-09-26: 15:00:00 via INTRAVENOUS

## 2018-09-26 MED ORDER — CIPROFLOXACIN HCL 500 MG PO TABS: 250.0000 mg | ORAL_TABLET | Freq: Two times a day (BID) | ORAL | Status: DC

## 2018-09-26 MED ORDER — CIPROFLOXACIN IN D5W 200 MG/100ML IV SOLN
200.0000 mg | Freq: Two times a day (BID) | INTRAVENOUS | Status: DC
  Administered 2018-09-26 – 2018-09-28 (×4): 200 mg via INTRAVENOUS
  Filled 2018-09-26 (×5): qty 100

## 2018-09-26 MED ORDER — HYDRALAZINE HCL 20 MG/ML IJ SOLN
5.0000 mg | Freq: Four times a day (QID) | INTRAMUSCULAR | Status: DC | PRN
  Administered 2018-09-27 – 2018-09-28 (×2): 5 mg via INTRAVENOUS
  Filled 2018-09-26 (×2): qty 1

## 2018-09-26 NOTE — Progress Notes (Signed)
PROGRESS NOTE    SWEDEN LESURE  QVZ:[id number] DOB: 04-19-1928 DOA: 09/25/2018 PCP: Lynnell Catalan, FNP   Brief Narrative: Julia Carey is a 84 y.o. female with medical history significant for past medical history of dementia hypothyroidism depression generalized muscle weakness, C1 fracture. Patient presented secondary to a fall and found to have a subarachnoid hemorrhage. Neurosurgery consulted and recommended non-operative management. Also with some worsened confusion on top of her underlying dementia. Possible UTI.   Assessment & Plan:   Principal Problem:   Subarachnoid hemorrhage following injury with brief loss of consciousness but without open intracranial wound ([place]) Active Problems:   Hypothyroidism   Dementia ([place])   Fall at home, initial encounter   Accelerated hypertension   Muscle weakness (generalized)   Subarachnoid hemorrhage ([place])   Subarachnoid hemorrhage Neurosurgery evaluated. No further management.  Acute encephalopathy Worsened on top of underlying cognitive impairment. Possibly secondary to head trauma and subarachnoid hemorrhage versus possible urinary tract infection. Speech has evaluated and has recommended continued NPO status for aspiration concern. -Continue NPO -Start Ciprofloxacin and wait for urine culture results  Hypothyroidism Currently NPO -Continue Synthroid when/f patient able to take by mouth  Dementia Patient is currently at a memory care unit.    DVT prophylaxis: SCDs Code Status:   Code Status: DNR Family Communication: niece at bedside Disposition Plan: Discharge back to ALF once medically stable   Consultants:   Neurosurgery  Procedures:   None  Antimicrobials:  Ciprofloxacin (12/15>>    Subjective: Not very verbal  Objective: Vitals:   09/25/18 1952 09/25/18 2344 09/26/18 0330 09/26/18 0758  BP: (!) 170/80 (!) 167/87 (!) 152/69 (!) 179/65  Pulse: 100 (!) 106 92 86  Resp: (!) 26  (!) 24 (!) 21  Temp:  (!) 97.5 F (36.4 C) 98.1 F (36.7 C) 98.7 F (37.1 C)   TempSrc: Axillary Axillary Oral   SpO2: 100% 100% 100% 100%    Intake/Output Summary (Last 24 hours) at 09/26/2018 1424 Last data filed at 09/25/2018 1455 Gross per 24 hour  Intake 7.83 ml  Output -  Net 7.83 ml   There were no vitals filed for this visit.  Examination:  General exam: Appears calm and comfortable Respiratory system: Clear to auscultation. Respiratory effort normal. Cardiovascular system: S1 & S2 heard, RRR. No murmurs. Gastrointestinal system: Abdomen is nondistended, soft and nontender. Normal bowel sounds heard. Central nervous system: Alert. Extremities: No edema. No calf tenderness Skin: No cyanosis. ecchymoses on face. Dried blood over left ear Psychiatry: Judgement and insight appear impaired. Flat affect.    Data Reviewed: I have personally reviewed following labs and imaging studies  CBC: Recent Labs  Lab 09/25/18 0853  WBC 9.4  HGB 11.9*  HCT 39.3  MCV 102.9*  PLT 329   Basic Metabolic Panel: Recent Labs  Lab 09/25/18 0853 09/26/18 0539  NA 142 140  K 3.7 4.1  CL 108 106  CO2 26 22  GLUCOSE 150* 105*  BUN 22 20  CREATININE 1.30* 1.12*  CALCIUM 10.0 9.8   GFR: CrCl cannot be calculated (Unknown ideal weight.). Liver Function Tests: Recent Labs  Lab 09/25/18 0853  AST 24  ALT 15  ALKPHOS 109  BILITOT 0.7  PROT 6.7  ALBUMIN 3.6   No results for input(s): LIPASE, AMYLASE in the last 168 hours. No results for input(s): AMMONIA in the last 168 hours. Coagulation Profile: No results for input(s): INR, PROTIME in the last 168 hours. Cardiac Enzymes: Recent Labs  Lab  09/25/18 0906  CKTOTAL 94   BNP (last 3 results) No results for input(s): PROBNP in the last 8760 hours. HbA1C: No results for input(s): HGBA1C in the last 72 hours. CBG: Recent Labs  Lab 09/26/18 0645  GLUCAP 101*   Lipid Profile: No results for input(s): CHOL, HDL, LDLCALC, TRIG, CHOLHDL,  LDLDIRECT in the last 72 hours. Thyroid Function Tests: No results for input(s): TSH, T4TOTAL, FREET4, T3FREE, THYROIDAB in the last 72 hours. Anemia Panel: No results for input(s): VITAMINB12, FOLATE, FERRITIN, TIBC, IRON, RETICCTPCT in the last 72 hours. Sepsis Labs: Recent Labs  Lab 09/25/18 3710  LATICACIDVEN 1.72    Recent Results (from the past 240 hour(s))  MRSA PCR Screening     Status: None   Collection Time: 09/26/18  3:56 AM  Result Value Ref Range Status   MRSA by PCR NEGATIVE NEGATIVE Final    Comment:        The GeneXpert MRSA Assay (FDA approved for NASAL specimens only), is one component of a comprehensive MRSA colonization surveillance program. It is not intended to diagnose MRSA infection nor to guide or monitor treatment for MRSA infections. Performed at [facility], [place] [address], [place], [place] [address]          Radiology Studies: Dg Chest 1 View  Result Date: 09/25/2018 CLINICAL DATA:  Pain after fall. EXAM: CHEST  1 VIEW COMPARISON:  None. FINDINGS: The heart, hila, and mediastinum are normal. No pneumothorax. No pulmonary nodules or masses. No focal infiltrates identified. IMPRESSION: No active disease. Electronically Signed   By: Dorise Bullion III M.D   On: 09/25/2018 11:49   Dg Pelvis 1-2 Views  Result Date: 09/25/2018 CLINICAL DATA:  Pain after fall EXAM: PELVIS - 1-2 VIEW COMPARISON:  None. FINDINGS: There is no evidence of pelvic fracture or diastasis. No pelvic bone lesions are seen. IMPRESSION: Negative. Electronically Signed   By: Dorise Bullion III M.D   On: 09/25/2018 11:52   Dg Elbow 2 Views Left  Result Date: 09/25/2018 CLINICAL DATA:  Fall today.  Posterior left elbow abrasion. EXAM: LEFT ELBOW - 2 VIEW COMPARISON:  None. FINDINGS: There is an IV in the antecubital fossa. The bones appear adequately mineralized. No evidence of acute fracture, dislocation or joint effusion. No focal soft tissue swelling, emphysema or  foreign body identified. IMPRESSION: No acute osseous findings. Electronically Signed   By: Richardean Sale M.D.   On: 09/25/2018 13:09   Ct Head Wo Contrast  Result Date: 09/25/2018 CLINICAL DATA:  Unwitnessed fall. Combative and nonverbal. EXAM: CT HEAD WITHOUT CONTRAST CT MAXILLOFACIAL WITHOUT CONTRAST CT CERVICAL SPINE WITHOUT CONTRAST TECHNIQUE: Multidetector CT imaging of the head, cervical spine, and maxillofacial structures were performed using the standard protocol without intravenous contrast. Multiplanar CT image reconstructions of the cervical spine and maxillofacial structures were also generated. COMPARISON:  07/30/2018. FINDINGS: The patient was unable to remain motionless for the exam. Small or subtle lesions could be overlooked. CT HEAD FINDINGS Brain: Advanced atrophy with chronic microvascular ischemic change. Layering intraventricular hemorrhage affecting the RIGHT greater than LEFT lateral ventricles. Mild subarachnoid hemorrhage over the frontal convexity, with a suspected punctate subcortical shearing injury RIGHT frontal lobe, series 3, image 27. No significant subdural collection or midline shift. Slightly greater than 1 cm lipoma of the quadrigeminal plate cistern, eccentric to the RIGHT. Vascular: Calcification of the cavernous internal carotid arteries consistent with cerebrovascular atherosclerotic disease. No signs of intracranial large vessel occlusion. Skull: No fracture. Other: LEFT frontal  scalp hematoma, possible laceration. No foreign body. CT MAXILLOFACIAL FINDINGS Osseous: No fracture or mandibular dislocation. No destructive process. Orbits: BILATERAL cataract extraction. No orbital hematoma. Sinuses: Dense LEFT maxillary sinus secretions, likely inspissated rather than hemosinus. Slight layering RIGHT maxillary sinus secretions. No significant layering fluid in the frontal or ethmoid sinuses. Layering LEFT division sphenoid sinus fluid, without features of basilar skull  fracture. Soft tissues: Unremarkable. CT CERVICAL SPINE FINDINGS Alignment: Reversal of the normal cervical lordosis, and degenerative slip C3-4 and C4-5 unchanged from priors.Degenerative scoliosis convex RIGHT. Skull base and vertebrae: No acute fracture. Chronic appearing C1 arch deformity. Soft tissues and spinal canal: Significant pannus surrounds the odontoid, with moderate upper cervical narrowing, but unchanged from October. No canal hematoma. Disc levels: Chronic spondylosis. No posttraumatic disc protrusion is evident. Upper chest: Dolichoectatic vasculature. No upper rib fracture or pneumothorax. Other: Airway appears patent. IMPRESSION: 1. Motion degraded exam demonstrating mild subarachnoid hemorrhage over the frontal convexity, with a suspected punctate subcortical shearing injury RIGHT frontal lobe. Small volume layering intraventricular blood. No significant subdural collection or midline shift. 2. Advanced atrophy and chronic microvascular ischemic change. 3. No facial fracture or blowout injury. Chronic appearing sinus disease. 4. LEFT frontal scalp hematoma, possible laceration. No foreign body. 5. No cervical spine fracture or traumatic subluxation. Chronic spondylosis. 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 6. EDP was unavailable to take the call report, and the finding of small volume intracranial blood was relayed to the ED staff. Electronically Signed   By: Staci Righter M.D.   On: 09/25/2018 12:07   Ct Cervical Spine Wo Contrast  Result Date: 09/25/2018 CLINICAL DATA:  Unwitnessed fall. Combative and nonverbal. EXAM: CT HEAD WITHOUT CONTRAST CT MAXILLOFACIAL WITHOUT CONTRAST CT CERVICAL SPINE WITHOUT CONTRAST TECHNIQUE: Multidetector CT imaging of the head, cervical spine, and maxillofacial structures were performed using the standard protocol without intravenous contrast. Multiplanar CT image reconstructions of the cervical spine and maxillofacial  structures were also generated. COMPARISON:  07/30/2018. FINDINGS: The patient was unable to remain motionless for the exam. Small or subtle lesions could be overlooked. CT HEAD FINDINGS Brain: Advanced atrophy with chronic microvascular ischemic change. Layering intraventricular hemorrhage affecting the RIGHT greater than LEFT lateral ventricles. Mild subarachnoid hemorrhage over the frontal convexity, with a suspected punctate subcortical shearing injury RIGHT frontal lobe, series 3, image 27. No significant subdural collection or midline shift. Slightly greater than 1 cm lipoma of the quadrigeminal plate cistern, eccentric to the RIGHT. Vascular: Calcification of the cavernous internal carotid arteries consistent with cerebrovascular atherosclerotic disease. No signs of intracranial large vessel occlusion. Skull: No fracture. Other: LEFT frontal scalp hematoma, possible laceration. No foreign body. CT MAXILLOFACIAL FINDINGS Osseous: No fracture or mandibular dislocation. No destructive process. Orbits: BILATERAL cataract extraction. No orbital hematoma. Sinuses: Dense LEFT maxillary sinus secretions, likely inspissated rather than hemosinus. Slight layering RIGHT maxillary sinus secretions. No significant layering fluid in the frontal or ethmoid sinuses. Layering LEFT division sphenoid sinus fluid, without features of basilar skull fracture. Soft tissues: Unremarkable. CT CERVICAL SPINE FINDINGS Alignment: Reversal of the normal cervical lordosis, and degenerative slip C3-4 and C4-5 unchanged from priors.Degenerative scoliosis convex RIGHT. Skull base and vertebrae: No acute fracture. Chronic appearing C1 arch deformity. Soft tissues and spinal canal: Significant pannus surrounds the odontoid, with moderate upper cervical narrowing, but unchanged from October. No canal hematoma. Disc levels: Chronic spondylosis. No posttraumatic disc protrusion is evident. Upper chest: Dolichoectatic vasculature. No upper rib  fracture or pneumothorax. Other:  Airway appears patent. IMPRESSION: 1. Motion degraded exam demonstrating mild subarachnoid hemorrhage over the frontal convexity, with a suspected punctate subcortical shearing injury RIGHT frontal lobe. Small volume layering intraventricular blood. No significant subdural collection or midline shift. 2. Advanced atrophy and chronic microvascular ischemic change. 3. No facial fracture or blowout injury. Chronic appearing sinus disease. 4. LEFT frontal scalp hematoma, possible laceration. No foreign body. 5. No cervical spine fracture or traumatic subluxation. Chronic spondylosis. 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 6. EDP was unavailable to take the call report, and the finding of small volume intracranial blood was relayed to the ED staff. Electronically Signed   By: Staci Righter M.D.   On: 09/25/2018 12:07   Dg Femur Min 2 Views Left  Result Date: 09/25/2018 CLINICAL DATA:  Pain after trauma EXAM: LEFT FEMUR 2 VIEWS COMPARISON:  None. FINDINGS: There is no evidence of fracture or other focal bone lesions. Soft tissues are unremarkable. IMPRESSION: Negative. Electronically Signed   By: Dorise Bullion III M.D   On: 09/25/2018 11:51   Ct Maxillofacial Wo Contrast  Result Date: 09/25/2018 CLINICAL DATA:  Unwitnessed fall. Combative and nonverbal. EXAM: CT HEAD WITHOUT CONTRAST CT MAXILLOFACIAL WITHOUT CONTRAST CT CERVICAL SPINE WITHOUT CONTRAST TECHNIQUE: Multidetector CT imaging of the head, cervical spine, and maxillofacial structures were performed using the standard protocol without intravenous contrast. Multiplanar CT image reconstructions of the cervical spine and maxillofacial structures were also generated. COMPARISON:  07/30/2018. FINDINGS: The patient was unable to remain motionless for the exam. Small or subtle lesions could be overlooked. CT HEAD FINDINGS Brain: Advanced atrophy with chronic microvascular ischemic change.  Layering intraventricular hemorrhage affecting the RIGHT greater than LEFT lateral ventricles. Mild subarachnoid hemorrhage over the frontal convexity, with a suspected punctate subcortical shearing injury RIGHT frontal lobe, series 3, image 27. No significant subdural collection or midline shift. Slightly greater than 1 cm lipoma of the quadrigeminal plate cistern, eccentric to the RIGHT. Vascular: Calcification of the cavernous internal carotid arteries consistent with cerebrovascular atherosclerotic disease. No signs of intracranial large vessel occlusion. Skull: No fracture. Other: LEFT frontal scalp hematoma, possible laceration. No foreign body. CT MAXILLOFACIAL FINDINGS Osseous: No fracture or mandibular dislocation. No destructive process. Orbits: BILATERAL cataract extraction. No orbital hematoma. Sinuses: Dense LEFT maxillary sinus secretions, likely inspissated rather than hemosinus. Slight layering RIGHT maxillary sinus secretions. No significant layering fluid in the frontal or ethmoid sinuses. Layering LEFT division sphenoid sinus fluid, without features of basilar skull fracture. Soft tissues: Unremarkable. CT CERVICAL SPINE FINDINGS Alignment: Reversal of the normal cervical lordosis, and degenerative slip C3-4 and C4-5 unchanged from priors.Degenerative scoliosis convex RIGHT. Skull base and vertebrae: No acute fracture. Chronic appearing C1 arch deformity. Soft tissues and spinal canal: Significant pannus surrounds the odontoid, with moderate upper cervical narrowing, but unchanged from October. No canal hematoma. Disc levels: Chronic spondylosis. No posttraumatic disc protrusion is evident. Upper chest: Dolichoectatic vasculature. No upper rib fracture or pneumothorax. Other: Airway appears patent. IMPRESSION: 1. Motion degraded exam demonstrating mild subarachnoid hemorrhage over the frontal convexity, with a suspected punctate subcortical shearing injury RIGHT frontal lobe. Small volume layering  intraventricular blood. No significant subdural collection or midline shift. 2. Advanced atrophy and chronic microvascular ischemic change. 3. No facial fracture or blowout injury. Chronic appearing sinus disease. 4. LEFT frontal scalp hematoma, possible laceration. No foreign body. 5. No cervical spine fracture or traumatic subluxation. Chronic spondylosis. 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 6.  EDP was unavailable to take the call report, and the finding of small volume intracranial blood was relayed to the ED staff. Electronically Signed   By: Staci Righter M.D.   On: 09/25/2018 12:07        Scheduled Meds: . acetaminophen  325 mg Oral TID  . donepezil  10 mg Oral QHS  . DULoxetine  30 mg Oral Daily  . fluticasone  1 spray Each Nare Daily  . levothyroxine  50 mcg Oral QAC breakfast  . loratadine  10 mg Oral Daily  . losartan  50 mg Oral Daily  . Melatonin  3 mg Oral QHS  . metoprolol tartrate  25 mg Oral BH-q7a  . polyethylene glycol  17 g Oral Daily  . vitamin B-12  1,000 mcg Oral QODAY   Continuous Infusions: . dextrose 5 % and 0.45% NaCl       LOS: 0 days     Cordelia Poche, MD [facility] 09/26/2018, 2:24 PM  If 7PM-7AM, please contact night-coverage [email or web address]

## 2018-09-26 NOTE — Evaluation (Signed)
Physical Therapy Evaluation Patient Details Name: Julia Carey MRN: [id number] DOB: May 14, 1928 Today's Date: 09/26/2018   History of Present Illness  84 y.o. woman s/p fall with traumatic brain injury. CT head personally reviewed, which shows quadrigeminal plate lipoma, small area of IVH, small areas of tSAH  Clinical Impression  Orders received for PT evaluation. Patient demonstrates deficits in functional mobility as indicated below. Will benefit from continued skilled PT to address deficits and maximize function. Will see as indicated and progress as tolerated.  At this time, evaluation limited to EOB and standing attempts. Patient non verbal throughout session. Does follow some intermittent commands with increased time. Noted to move all extremities throughout session. Will recommend ST SNF rehabilitation upon acute discharge.    Follow Up Recommendations SNF;Supervision/Assistance - 24 hour    Equipment Recommendations  (tbd)    Recommendations for Other Services       Precautions / Restrictions Precautions Precautions: Fall Precaution Comments: casted right wrist from fx ~4wks ago Required Braces or Orthoses: Cervical Brace Cervical Brace: Hard collar Restrictions Weight Bearing Restrictions: No      Mobility  Bed Mobility Overal bed mobility: Needs Assistance Bed Mobility: Rolling;Supine to Sit;Sit to Supine Rolling: Mod assist(is able to bridge up in bed)   Supine to sit: Max assist Sit to supine: Min assist   General bed mobility comments: patient moderate assist to roll over in bed with cues for initiation of movement, increased time and effort to perform. Max assist to come to standing +2 for safety, min assist to return to bed.  Transfers Overall transfer level: Needs assistance Equipment used: 2 person hand held assist Transfers: Sit to/from Stand Sit to Stand: Max assist;+2 physical assistance         General transfer comment: +2 max assist to power  up and maintain stability in standing, performed x2 during session  Ambulation/Gait             General Gait Details: unable to perform today  Stairs            Wheelchair Mobility    Modified Rankin (Stroke Patients Only)       Balance Overall balance assessment: Needs assistance;History of Falls   Sitting balance-Leahy Scale: Poor Sitting balance - Comments: need for hands on physical assist for most of the time at EOB, ocassional ability to maintain with min guard ~10-15 seconds then fatigues, fwd flexed posture noted   Standing balance support: During functional activity;Bilateral upper extremity supported Standing balance-Leahy Scale: Poor Standing balance comment: requires external physical assist to maintain static standing, appears in discomfort in this position                             Pertinent Vitals/Pain Pain Assessment: Faces Faces Pain Scale: Hurts even more Pain Location: generalized with movement Pain Descriptors / Indicators: Grimacing Pain Intervention(s): Monitored during session    Home Living Family/patient expects to be discharged to:: Skilled nursing facility                 Additional Comments: resides in a memory care unit at [facility]    Prior Function Level of Independence: Needs assistance   Gait / Transfers Assistance Needed: using w/c for mobility due to inability to grip RW at this time, can walk with assist. Prior to wrist fx, patient was mobilizing well with RW     Comments: was living at [facility]/memory care  Hand Dominance        Extremity/Trunk Assessment   Upper Extremity Assessment Upper Extremity Assessment: RUE deficits/detail;Difficult to assess due to impaired cognition RUE: Unable to fully assess due to immobilization    Lower Extremity Assessment Lower Extremity Assessment: Generalized weakness;Difficult to assess due to impaired cognition(moving all  extremeties)    Cervical / Trunk Assessment Cervical / Trunk Assessment: Kyphotic  Communication   Communication: HOH  Cognition Arousal/Alertness: Lethargic Behavior During Therapy: Restless Overall Cognitive Status: Difficult to assess Area of Impairment: Following commands;Safety/judgement;Problem solving;Awareness;Memory;Attention;Orientation                   Current Attention Level: Focused   Following Commands: Follows one step commands inconsistently;Follows one step commands with increased time Safety/Judgement: Decreased awareness of safety;Decreased awareness of deficits   Problem Solving: Slow processing;Decreased initiation;Difficulty sequencing;Requires verbal cues;Requires tactile cues        General Comments      Exercises     Assessment/Plan    PT Assessment Patient needs continued PT services  PT Problem List Decreased strength;Decreased range of motion;Decreased activity tolerance;Decreased balance;Decreased mobility;Decreased coordination;Decreased cognition;Decreased knowledge of use of DME;Decreased safety awareness;Decreased knowledge of precautions;Cardiopulmonary status limiting activity;Pain       PT Treatment Interventions DME instruction;Gait training;Functional mobility training;Therapeutic activities;Therapeutic exercise;Balance training;Neuromuscular re-education;Patient/family education;Cognitive remediation;Wheelchair mobility training    PT Goals (Current goals can be found in the Care Plan section)  Acute Rehab PT Goals Patient Stated Goal: none stated PT Goal Formulation: Patient unable to participate in goal setting Time For Goal Achievement: 10/10/18 Potential to Achieve Goals: Fair    Frequency Min 3X/week   Barriers to discharge Decreased caregiver support      Co-evaluation               AM-PAC PT "6 Clicks" Mobility  Outcome Measure Help needed turning from your back to your side while in a flat bed without  using bedrails?: A Lot Help needed moving from lying on your back to sitting on the side of a flat bed without using bedrails?: A Lot Help needed moving to and from a bed to a chair (including a wheelchair)?: A Lot Help needed standing up from a chair using your arms (e.g., wheelchair or bedside chair)?: A Lot Help needed to walk in hospital room?: A Lot Help needed climbing 3-5 steps with a railing? : Total 6 Click Score: 11    End of Session Equipment Utilized During Treatment: Gait belt Activity Tolerance: Patient limited by fatigue;Patient limited by lethargy Patient left: in chair;with call bell/phone within reach;with bed alarm set Nurse Communication: Mobility status PT Visit Diagnosis: Other symptoms and signs involving the nervous system (R29.898);Difficulty in walking, not elsewhere classified (R26.2)    Time: 6789-3810 PT Time Calculation (min) (ACUTE ONLY): 17 min   Charges:   PT Evaluation $PT Eval Moderate Complexity: 1 Mod          Alben Deeds, PT DPT  Board Certified Neurologic Specialist Acute Rehabilitation Services Pager [phone] Office [phone]   Duncan Dull 09/26/2018, 12:49 PM

## 2018-09-26 NOTE — Evaluation (Signed)
Clinical/Bedside Swallow Evaluation Patient Details  Name: Julia Carey MRN: [id number] Date of Birth: 1928-10-03  Today's Date: 09/26/2018 Time: SLP Start Time (ACUTE ONLY): 1250 SLP Stop Time (ACUTE ONLY): 1315 SLP Time Calculation (min) (ACUTE ONLY): 25 min  Past Medical History:  Past Medical History:  Diagnosis Date  . Age-related osteoporosis without current pathological fracture   . Dementia ([place])   . Dysphagia   . Fracture of first cervical vertebra (HCC)   . Hypothyroidism   . Insomnia   . Major depression, single episode   . Muscle weakness (generalized)    Past Surgical History: No past surgical history on file. HPI:  Julia Carey is a 84 y.o. female with medical history significant for past medical history of dementia hypothyroidism depression generalized muscle weakness, C1 fracture May 26, 2018 she was wearing a neck collar and then the neurosurgeon released the neck collar and started her on soft collar.  Also right wrist fracture about 4 weeks ago she still wearing a cast osteoporosis rheumatoid arthritis who was brought from the nursing facility this morning when patient was found on the floor did not know how long she had been on the floor patient at baseline is confused but talkative and able to communicate and he noted that she was not comfortable as talkative as she used to be. History of dysphagia mention in HPI, no notes in chart to clarify.    Assessment / Plan / Recommendation Clinical Impression  Pt demonstrates improving arousal. Provided repositioning and oral care to loosen dried secretions. With extra time and verbal cues pt able to elicit swallow response with ice which resulted in pts hard volitional expectoration of dried bloody pharyngeal mucous. Discussed adding humidified O2 for pt to help keep nasal and oropharyngeal tissues more hydrated. Pt shows potential for PO intake when more alert.  SLP Visit Diagnosis: Dysphagia, oropharyngeal phase  (R13.12)    Aspiration Risk  Moderate aspiration risk    Diet Recommendation NPO        Other  Recommendations Oral Care Recommendations: Oral care QID Other Recommendations: Have oral suction available   Follow up Recommendations Skilled Nursing facility      Frequency and Duration min 2x/week  2 weeks       Prognosis        Swallow Study   General HPI: Julia Carey is a 84 y.o. female with medical history significant for past medical history of dementia hypothyroidism depression generalized muscle weakness, C1 fracture May 26, 2018 she was wearing a neck collar and then the neurosurgeon released the neck collar and started her on soft collar.  Also right wrist fracture about 4 weeks ago she still wearing a cast osteoporosis rheumatoid arthritis who was brought from the nursing facility this morning when patient was found on the floor did not know how long she had been on the floor patient at baseline is confused but talkative and able to communicate and he noted that she was not comfortable as talkative as she used to be. History of dysphagia mention in HPI, no notes in chart to clarify.  Type of Study: Bedside Swallow Evaluation Diet Prior to this Study: NPO Temperature Spikes Noted: No Respiratory Status: Nasal cannula History of Recent Intubation: No Behavior/Cognition: Lethargic/Drowsy;Confused Oral Cavity Assessment: Dry;Dried secretions(dried bloody secretions) Oral Care Completed by SLP: Yes Oral Cavity - Dentition: Adequate natural dentition Self-Feeding Abilities: Total assist Patient Positioning: Upright in bed Baseline Vocal Quality: Normal Volitional Cough: Cognitively unable to  elicit Volitional Swallow: Unable to elicit    Oral/Motor/Sensory Function Overall Oral Motor/Sensory Function: Generalized oral weakness   Ice Chips Ice chips: Impaired Presentation: Spoon Pharyngeal Phase Impairments: Throat Clearing - Delayed   Thin Liquid Thin Liquid: Not  tested    Nectar Thick Nectar Thick Liquid: Not tested   Honey Thick Honey Thick Liquid: Not tested   Puree Puree: Not tested   Solid     Solid: Not tested     Herbie Baltimore, MA CCC-SLP  Acute Rehabilitation Services Pager [phone] Office [phone]  Shakyla Nolley, Katherene Ponto 09/26/2018,1:46 PM

## 2018-09-26 NOTE — Care Management Obs Status (Signed)
[facility] NOTIFICATION   Patient Details  Name: Julia Carey MRN: [id number] Date of Birth: 1927/11/07   Medicare Observation Status Notification Given:  Yes    Carles Collet, RN 09/26/2018, 2:56 PM

## 2018-09-26 NOTE — Progress Notes (Addendum)
Pharmacy Antibiotic Note  Julia Carey is a 84 y.o. female admitted on 09/25/2018 with possible UTI.  Pharmacy has been consulted for ciprofloxaxin dosing. Patient has documented allergy to pcns and cephalosporins.   Plan: Ciprofloxacin 200mg  Q12H IV Follow up LOT, clinical status, cx     Temp (24hrs), Avg:98.2 F (36.8 C), Min:97.5 F (36.4 C), Max:98.7 F (37.1 C)  Recent Labs  Lab 09/25/18 0853 09/25/18 0906 09/26/18 0539  WBC 9.4  --   --   CREATININE 1.30*  --  1.12*  LATICACIDVEN  --  1.72  --     CrCl cannot be calculated (Unknown ideal weight.).    Allergies  Allergen Reactions  . Amoxicillin Other (See Comments)    On MAR  . Clindamycin/Lincomycin Other (See Comments)    On MAR  . Keflex [Cephalexin] Other (See Comments)    On MAR    Antimicrobials this admission:   Microbiology results:  12/14 UCx:    Thank you for allowing pharmacy to be a part of this patient's care.  Harrietta Guardian, PharmD PGY1 Pharmacy Resident 09/26/2018    3:18 PM Please check AMION for all [facility] numbers

## 2018-09-26 NOTE — Progress Notes (Signed)
Neurosurgery Service Progress Note  Subjective: No acute events overnight, no new complaints today  Objective: Vitals:   09/25/18 1952 09/25/18 2344 09/26/18 0330 09/26/18 0758  BP: (!) 170/80 (!) 167/87 (!) 152/69 (!) 179/65  Pulse: 100 (!) 106 92 86  Resp: (!) 26  (!) 24 (!) 21  Temp: (!) 97.5 F (36.4 C) 98.1 F (36.7 C) 98.7 F (37.1 C)   TempSrc: Axillary Axillary Oral   SpO2: 100% 100% 100% 100%   Temp (24hrs), Avg:98.2 F (36.8 C), Min:97.5 F (36.4 C), Max:98.7 F (37.1 C)  CBC Latest Ref Rng & Units 09/25/2018  WBC 4.0 - 10.5 K/uL 9.4  Hemoglobin 12.0 - 15.0 g/dL 11.9(L)  Hematocrit 36.0 - 46.0 % 39.3  Platelets 150 - 400 K/uL 258   BMP Latest Ref Rng & Units 09/26/2018 09/25/2018  Glucose 70 - 99 mg/dL 105(H) 150(H)  BUN 8 - 23 mg/dL 20 22  Creatinine 0.44 - 1.00 mg/dL 1.12(H) 1.30(H)  Sodium 135 - 145 mmol/L 140 142  Potassium 3.5 - 5.1 mmol/L 4.1 3.7  Chloride 98 - 111 mmol/L 106 108  CO2 22 - 32 mmol/L 22 26  Calcium 8.9 - 10.3 mg/dL 9.8 10.0    Intake/Output Summary (Last 24 hours) at 09/26/2018 1243 Last data filed at 09/25/2018 1455 Gross per 24 hour  Intake 7.83 ml  Output -  Net 7.83 ml    Current Facility-Administered Medications:  .  0.9 %  sodium chloride infusion, , Intravenous, Continuous, Bodenheimer, Charles A, NP, Last Rate: 50 mL/hr at 09/25/18 2100 .  acetaminophen (TYLENOL) tablet 325 mg, 325 mg, Oral, TID, Cristal Deer, MD .  donepezil (ARICEPT) tablet 10 mg, 10 mg, Oral, QHS, Cristal Deer, MD .  DULoxetine (CYMBALTA) DR capsule 30 mg, 30 mg, Oral, Daily, Cristal Deer, MD .  fluticasone (FLONASE) 50 MCG/ACT nasal spray 1 spray, 1 spray, Each Nare, Daily, Cristal Deer, MD .  levothyroxine (SYNTHROID, LEVOTHROID) tablet 50 mcg, 50 mcg, Oral, QAC breakfast, Cristal Deer, MD .  loratadine (CLARITIN) tablet 10 mg, 10 mg, Oral, Daily, Cristal Deer, MD .  losartan (COZAAR) tablet 50 mg, 50 mg, Oral, Daily, Cristal Deer, MD .  Melatonin TABS 3 mg, 3 mg, Oral, QHS, Cristal Deer, MD .  metoprolol tartrate (LOPRESSOR) tablet 25 mg, 25 mg, Oral, BH-q7a, Cristal Deer, MD .  polyethylene glycol (MIRALAX / GLYCOLAX) packet 17 g, 17 g, Oral, Daily, Cristal Deer, MD .  vitamin B-12 (CYANOCOBALAMIN) tablet 1,000 mcg, 1,000 mcg, Oral, QODAY, Cristal Deer, MD   Physical Exam: Eyes open spontaneously, Ox1 and interactive, MAEx4 and follows some commands, R wrist casted  Assessment & Plan: 84 y.o. woman s/p fall with TBI, exam significantly improved today. -pt recovering back towards baseline, no repeat imaging indicated at this time, no scheduled neurosurgical follow up needed. 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  Julia Carey  09/26/18 12:43 PM

## 2018-09-27 DIAGNOSIS — E039 Hypothyroidism, unspecified: Secondary | ICD-10-CM

## 2018-09-27 LAB — GLUCOSE, CAPILLARY
GLUCOSE-CAPILLARY: 99 mg/dL (ref 70–99)
Glucose-Capillary: 110 mg/dL — ABNORMAL HIGH (ref 70–99)
Glucose-Capillary: 157 mg/dL — ABNORMAL HIGH (ref 70–99)
Glucose-Capillary: 127 mg/dL — ABNORMAL HIGH (ref 70–99)
Glucose-Capillary: 125 mg/dL — ABNORMAL HIGH (ref 70–99)

## 2018-09-27 NOTE — Progress Notes (Signed)
PROGRESS NOTE    Julia Carey  XNT:[id number] DOB: [birth year] DOA: 09/25/2018 PCP: Lynnell Catalan, FNP   Brief Narrative: Julia Carey is a 84 y.o. female with medical history significant for past medical history of dementia hypothyroidism depression generalized muscle weakness, C1 fracture. Patient presented secondary to a fall and found to have a subarachnoid hemorrhage. Neurosurgery consulted and recommended non-operative management. Also with some worsened confusion on top of her underlying dementia. Possible UTI.   Assessment & Plan:   Principal Problem:   Subarachnoid hemorrhage following injury with brief loss of consciousness but without open intracranial wound ([place]) Active Problems:   Hypothyroidism   Dementia ([place])   Fall at home, initial encounter   Accelerated hypertension   Muscle weakness (generalized)   Subarachnoid hemorrhage ([place])   Subarachnoid hemorrhage Neurosurgery evaluated. No further management.  Acute encephalopathy Worsened on top of underlying cognitive impairment. Possibly secondary to head trauma and subarachnoid hemorrhage versus possible urinary tract infection. Improvement today. Evidence of bacteria in her urine sample. -SLP recommendations: dysphagia 3 diet -Continue Ciprofloxacin and wait for urine culture results  UTI E. Coli. Sensitivities pending -Continue Ciprofloxacin  Hypothyroidism Currently NPO -Continue Synthroid when/f patient able to take by mouth  Dementia Patient is currently at a memory care unit.    DVT prophylaxis: SCDs Code Status:   Code Status: DNR Family Communication: None at bedside. Nephew at nurses desk Disposition Plan: Discharge back to ALF once medically stable if able to accommodate vs SNF   Consultants:   Neurosurgery  Procedures:   None  Antimicrobials:  Ciprofloxacin (12/15>>    Subjective: No concerns per patient.  Objective: Vitals:   09/27/18 0258 09/27/18 0419 09/27/18 0753  09/27/18 1135  BP: (!) 193/82 (!) 145/104 (!) 183/72 (!) 136/49  Pulse: 83 98 100 (!) 105  Resp: 15 16 16 16   Temp: 98.2 F (36.8 C)  98 F (36.7 C) 97.9 F (36.6 C)  TempSrc: Oral  Oral Oral  SpO2: 100% 100% 97% 99%    Intake/Output Summary (Last 24 hours) at 09/27/2018 1549 Last data filed at 09/27/2018 1546 Gross per 24 hour  Intake 1928.17 ml  Output 950 ml  Net 978.17 ml   There were no vitals filed for this visit.  Examination:  General exam: Appears calm and comfortable Respiratory system: Clear to auscultation. Respiratory effort normal. Cardiovascular system: S1 & S2 heard, RRR. No murmurs, rubs, gallops or clicks. Gastrointestinal system: Abdomen is nondistended, soft and nontender. Normal bowel sounds heard. Central nervous system: Alert. No focal neurological deficits. Follows commands. Extremities: No edema. No calf tenderness Skin: No cyanosis. No rashes. Hematoma of left temple Psychiatry: Judgement and insight appear normal. Mood & affect appropriate.    Data Reviewed: I have personally reviewed following labs and imaging studies  CBC: Recent Labs  Lab 09/25/18 0853  WBC 9.4  HGB 11.9*  HCT 39.3  MCV 102.9*  PLT 967   Basic Metabolic Panel: Recent Labs  Lab 09/25/18 0853 09/26/18 0539  NA 142 140  K 3.7 4.1  CL 108 106  CO2 26 22  GLUCOSE 150* 105*  BUN 22 20  CREATININE 1.30* 1.12*  CALCIUM 10.0 9.8   GFR: CrCl cannot be calculated (Unknown ideal weight.). Liver Function Tests: Recent Labs  Lab 09/25/18 0853  AST 24  ALT 15  ALKPHOS 109  BILITOT 0.7  PROT 6.7  ALBUMIN 3.6   No results for input(s): LIPASE, AMYLASE in the last 168 hours. No results for  input(s): AMMONIA in the last 168 hours. Coagulation Profile: No results for input(s): INR, PROTIME in the last 168 hours. Cardiac Enzymes: Recent Labs  Lab 09/25/18 0906  CKTOTAL 94   BNP (last 3 results) No results for input(s): PROBNP in the last 8760  hours. HbA1C: No results for input(s): HGBA1C in the last 72 hours. CBG: Recent Labs  Lab 09/26/18 0645 09/26/18 2336 09/27/18 0651 09/27/18 1133  GLUCAP 101* 99 110* 157*   Lipid Profile: No results for input(s): CHOL, HDL, LDLCALC, TRIG, CHOLHDL, LDLDIRECT in the last 72 hours. Thyroid Function Tests: No results for input(s): TSH, T4TOTAL, FREET4, T3FREE, THYROIDAB in the last 72 hours. Anemia Panel: No results for input(s): VITAMINB12, FOLATE, FERRITIN, TIBC, IRON, RETICCTPCT in the last 72 hours. Sepsis Labs: Recent Labs  Lab 09/25/18 0906  LATICACIDVEN 1.72    Recent Results (from the past 240 hour(s))  Culture, Urine     Status: Abnormal (Preliminary result)   Collection Time: 09/25/18  3:46 PM  Result Value Ref Range Status   Specimen Description URINE, RANDOM  Final   Special Requests ADDED 09/26/18 0532  Final   Culture >=100,000 COLONIES/mL ESCHERICHIA COLI (A)  Final   Report Status PENDING  Incomplete  MRSA PCR Screening     Status: None   Collection Time: 09/26/18  3:56 AM  Result Value Ref Range Status   MRSA by PCR NEGATIVE NEGATIVE Final    Comment:        The GeneXpert MRSA Assay (FDA approved for NASAL specimens only), is one component of a comprehensive MRSA colonization surveillance program. It is not intended to diagnose MRSA infection nor to guide or monitor treatment for MRSA infections. Performed at [facility], [place] [address], [place], [place] [address]          Radiology Studies: No results found.      Scheduled Meds: . acetaminophen  325 mg Oral TID  . donepezil  10 mg Oral QHS  . DULoxetine  30 mg Oral Daily  . fluticasone  1 spray Each Nare Daily  . levothyroxine  50 mcg Oral QAC breakfast  . loratadine  10 mg Oral Daily  . losartan  50 mg Oral Daily  . Melatonin  3 mg Oral QHS  . metoprolol tartrate  25 mg Oral BH-q7a  . polyethylene glycol  17 g Oral Daily  . vitamin B-12  1,000 mcg Oral QODAY    Continuous Infusions: . ciprofloxacin 200 mg (09/27/18 1040)  . dextrose 5 % and 0.45% NaCl 75 mL/hr at 09/26/18 1747     LOS: 1 day     Cordelia Poche, MD [facility] 09/27/2018, 3:49 PM  If 7PM-7AM, please contact night-coverage [email or web address]

## 2018-09-27 NOTE — Progress Notes (Signed)
Occupational Therapy Evaluation Patient Details Name: Julia Carey MRN: [id number] DOB: 08/09/1928 Today's Date: 09/27/2018    History of Present Illness 84 y.o. female with medical history significant for past medical history of dementia hypothyroidism depression generalized muscle weakness, C1 fracture May 26, 2018 she was wearing a neck collar and then the neurosurgeon released the neck collar and started her on soft collar.  Also right wrist fracture about 4 weeks ago she still wearing a cast osteoporosis rheumatoid arthritis who was brought from the nursing facility when patient was found on the floor CT head personally reviewed, which shows quadrigeminal plate lipoma, small area of IVH, small areas of tSAH.   Clinical Impression   PTA, pt living at [facility] ALF in the memory care unit. Pt will rehab at SNF if [facility] unable to provide current level of care for pt. Will follow acutely to facilitate DC to next venue of care.     Follow Up Recommendations  SNF;Supervision/Assistance - 24 hour    Equipment Recommendations  None recommended by OT    Recommendations for Other Services       Precautions / Restrictions Precautions Precautions: Fall Precaution Comments: casted right wrist from fx ~4wks ago Required Braces or Orthoses: Cervical Brace Cervical Brace: Soft collar Restrictions Weight Bearing Restrictions: No      Mobility Bed Mobility Overal bed mobility: Needs Assistance Bed Mobility: Supine to Sit;Sit to Supine Rolling: (is able to bridge up in bed)   Supine to sit: Mod assist Sit to supine: Min assist      Transfers Overall transfer level: Needs assistance Equipment used: 1 person hand held assist Transfers: Sit to/from Stand Sit to Stand: Max assist         General transfer comment:  significant posterior lean    Balance Overall balance assessment: Needs assistance;History of Falls   Sitting balance-Leahy Scale: Poor Sitting balance -  Comments:  posteiror bias; able to maintain static siting for short periods; leans posteriorly when distracted   Standing balance support: During functional activity;Bilateral upper extremity supported Standing balance-Leahy Scale: Poor Standing balance comment: requires external physical assist to maintain static standing, appears in discomfort in this position                           ADL either performed or assessed with clinical judgement   ADL Overall ADL's : Needs assistance/impaired Eating/Feeding: Minimal assistance;Sitting   Grooming: Minimal assistance;Sitting   Upper Body Bathing: Moderate assistance;Sitting   Lower Body Bathing: Maximal assistance;Sit to/from stand   Upper Body Dressing : Moderate assistance;Sitting   Lower Body Dressing: Maximal assistance;Sit to/from stand   Toilet Transfer: Maximal assistance   Toileting- Clothing Manipulation and Hygiene: Maximal assistance       Functional mobility during ADLs: Maximal assistance       Vision   Additional Comments: appeasr to be reaching approiprately for objects; will further assess     Perception     Praxis      Pertinent Vitals/Pain Pain Assessment: Faces Faces Pain Scale: Hurts little more Pain Location: generalized with movement Pain Descriptors / Indicators: Grimacing Pain Intervention(s): Limited activity within patient's tolerance     Hand Dominance Right   Extremity/Trunk Assessment Upper Extremity Assessment Upper Extremity Assessment: RUE deficits/detail;Generalized weakness RUE Deficits / Details: R cast due to wrist fracture       Cervical / Trunk Assessment Cervical / Trunk Assessment: Kyphotic   Communication Communication Communication: [facility]  Cognition Arousal/Alertness: Awake/alert Behavior During Therapy: Restless;Impulsive Overall Cognitive Status: No family/caregiver present to determine baseline cognitive functioning Area of Impairment: Following  commands;Safety/judgement;Problem solving;Awareness;Memory;Attention;Orientation                 Orientation Level: Disoriented to;Place;Time;Situation Current Attention Level: Focused Memory: Decreased recall of precautions;Decreased short-term memory Following Commands: Follows one step commands with increased time Safety/Judgement: Decreased awareness of safety;Decreased awareness of deficits Awareness: Intellectual Problem Solving: Slow processing;Decreased initiation;Difficulty sequencing;Requires verbal cues;Requires tactile cues     General Comments       Exercises     Shoulder Instructions      Home Living Family/patient expects to be discharged to:: Skilled nursing facility                                 Additional Comments: resides in a memory care unit at [facility]      Prior Functioning/Environment Level of Independence: Needs assistance  Gait / Transfers Assistance Needed: using w/c for mobility due to inability to grip RW at this time, can walk with assist. Prior to wrist fx, patient was mobilizing well with RW ADL's / Homemaking Assistance Needed: staff assists with ADL   Comments: was living at [facility]/memory care        OT Problem List: Decreased strength;Decreased activity tolerance;Impaired balance (sitting and/or standing);Decreased coordination;Decreased cognition;Decreased safety awareness;Decreased knowledge of use of DME or AE;Pain      OT Treatment/Interventions: Self-care/ADL training;Therapeutic exercise;Neuromuscular education;DME and/or AE instruction;Therapeutic activities;Cognitive remediation/compensation;Visual/perceptual remediation/compensation;Patient/family education;Balance training    OT Goals(Current goals can be found in the care plan section) Acute Rehab OT Goals Patient Stated Goal: none stated OT Goal Formulation: Patient unable to participate in goal setting Time For Goal Achievement:  10/11/18 Potential to Achieve Goals: Good  OT Frequency: Min 2X/week   Barriers to D/C:            Co-evaluation              AM-PAC OT "6 Clicks" Daily Activity     Outcome Measure Help from another person eating meals?: A Little Help from another person taking care of personal grooming?: A Lot Help from another person toileting, which includes using toliet, bedpan, or urinal?: A Lot Help from another person bathing (including washing, rinsing, drying)?: A Lot Help from another person to put on and taking off regular upper body clothing?: A Lot Help from another person to put on and taking off regular lower body clothing?: A Lot 6 Click Score: 13   End of Session Equipment Utilized During Treatment: Other (comment)(R arm casat) Nurse Communication: Mobility status  Activity Tolerance: Patient tolerated treatment well Patient left: in bed;with call bell/phone within reach;with nursing/sitter in room  OT Visit Diagnosis: Unsteadiness on feet (R26.81);Repeated falls (R29.6);Muscle weakness (generalized) (M62.81);Other symptoms and signs involving cognitive function;Pain Pain - part of body: (head)                Time: 1030-1105 OT Time Calculation (min): 35 min Charges:  OT General Charges $OT Visit: 1 Visit OT Evaluation $OT Eval Moderate Complexity: 1 Mod OT Treatments $Self Care/Home Management : 8-22 mins  Maurie Boettcher, OT/L   Acute OT Clinical Specialist Acute Rehabilitation Services Pager [phone] Office [phone]   [facility] 09/27/2018, 11:18 AM

## 2018-09-27 NOTE — Clinical Social Work Note (Signed)
Clinical Social Work Assessment  Patient Details  Name: Julia Carey MRN: [id number] Date of Birth: 1928/06/04  Date of referral:  09/27/18               Reason for consult:  Facility Placement                Permission sought to share information with:  Facility Sport and exercise psychologist, Family Supports Permission granted to share information::  Yes, Verbal Permission Granted  Name::     Conservation officer, historic buildings::  [place] Age  Relationship::  [employer] Information:     Housing/Transportation Living arrangements for the past 2 months:  [place] of Information:  Medical Team, Other (Comment Required) Patient Interpreter Needed:  None Criminal Activity/Legal Involvement Pertinent to Current Situation/Hospitalization:  No - Comment as needed Significant Relationships:  Other Family Members Lives with:  Self, Facility Resident Do you feel safe going back to the place where you live?  Yes Need for family participation in patient care:  Yes (Comment)  Care giving concerns:  Patient from [name], and family has no concerns about care received.    Social Worker assessment / plan:  CSW spoke with patient's nephew over the phone to discuss discharge plan. Patient's nephew would prefer if patient is able to return to her memory care, but understands that she has to be at an appropriate level for return. CSW contacted patient's Memory care and left a voicemail with the care coordinator to discuss patient's return, awaiting call back.  Employment status:  Retired Nurse, adult PT Recommendations:  [place] / Referral to community resources:     Patient/Family's Response to care:  Patient's family hopeful that patient can return to her memory care.  Patient/Family's Understanding of and Emotional Response to Diagnosis, Current Treatment, and Prognosis:  Patient's nephew discussed how he  thinks that the patient is doing better today than she had been, although he wasn't able to see her on admission as he was on vacation when the incident happened. Patient's nephew hopeful that the patient can return to her memory care because that's where she's comfortable and is familiar, but knows that she will need to be at a certain level in order for that to happen.  Emotional Assessment Appearance:  Appears stated age Attitude/Demeanor/Rapport:  Unable to Assess Affect (typically observed):  Unable to Assess Orientation:  Oriented to Self Alcohol / Substance use:  Not Applicable Psych involvement (Current and /or in the community):  No (Comment)  Discharge Needs  Concerns to be addressed:  Care Coordination Readmission within the last 30 days:  No Current discharge risk:  Physical Impairment, Cognitively Impaired, Dependent with Mobility Barriers to Discharge:  Continued Medical Work up   [employer], [place] 09/27/2018, 3:20 PM

## 2018-09-27 NOTE — Progress Notes (Signed)
  Speech Language Pathology Treatment: Dysphagia  Patient Details Name: Julia Carey MRN: [id number] DOB: 11-Feb-1928 Today's Date: 09/27/2018 Time: 8889-1694 SLP Time Calculation (min) (ACUTE ONLY): 12 min  Assessment / Plan / Recommendation Clinical Impression  Pt generally much improved today. She is alert, aware of PO, oral hygiene is normal. Pt tolerated several oz of thin liquids without signs of aspiration, also able to masticate graham cracker without difficulty. Will intiate a mechanical soft diet with thin liquids with full supervision/assist. Anticipate f/u at next level of care for diet advancement and cognition which is also improving.   HPI HPI: Julia Carey is a 84 y.o. female with medical history significant for past medical history of dementia hypothyroidism depression generalized muscle weakness, C1 fracture May 26, 2018 she was wearing a neck collar and then the neurosurgeon released the neck collar and started her on soft collar.  Also right wrist fracture about 4 weeks ago she still wearing a cast osteoporosis rheumatoid arthritis who was brought from the nursing facility this morning when patient was found on the floor did not know how long she had been on the floor patient at baseline is confused but talkative and able to communicate and he noted that she was not comfortable as talkative as she used to be. History of dysphagia mention in HPI, no notes in chart to clarify.       SLP Plan  Continue with current plan of care       Recommendations  Diet recommendations: Dysphagia 3 (mechanical soft);Thin liquid Liquids provided via: Cup;Straw Medication Administration: Whole meds with liquid Supervision: Staff to assist with self feeding Compensations: Slow rate;Small sips/bites Postural Changes and/or Swallow Maneuvers: Seated upright 90 degrees                Oral Care Recommendations: Oral care BID Follow up Recommendations: Skilled Nursing facility SLP  Visit Diagnosis: Dysphagia, oropharyngeal phase (R13.12) Plan: Continue with current plan of care       GO               Herbie [place], MA [place] Pager [phone] Office [phone]  Lynann Beaver 09/27/2018, 9:14 AM

## 2018-09-27 NOTE — NC FL2 (Signed)
[place] LEVEL OF CARE SCREENING TOOL     IDENTIFICATION  Patient Name: Julia Carey Birthdate: 1928-04-22 Sex: female Admission Date (Current Location): 09/25/2018  [facility] and [place] Number:  Herbalist and Address:  [facility], [place] [address] [place], [place] [address]      Provider Number: [id number]  Attending Physician Name and Address:  Mariel Aloe, MD  Relative Name and Phone Number:       Current Level of Care: Hospital Recommended Level of Care: Memory Care Prior Approval Number:    Date Approved/Denied:   PASRR Number:    Discharge Plan: Other (Comment)(Memory Care)    Current Diagnoses: Patient Active Problem List   Diagnosis Date Noted  . Hypothyroidism 09/25/2018  . Dementia ([place]) 09/25/2018  . Fall at home, initial encounter 09/25/2018  . Subarachnoid hemorrhage following injury with brief loss of consciousness but without open intracranial wound ([place]) 09/25/2018  . Accelerated hypertension 09/25/2018  . Muscle weakness (generalized) 09/25/2018  . Subarachnoid hemorrhage ([place]) 09/25/2018    Orientation RESPIRATION BLADDER Height & Weight     Self  Normal Incontinent Weight:   Height:     BEHAVIORAL SYMPTOMS/MOOD NEUROLOGICAL BOWEL NUTRITION STATUS      Continent Diet(mechanical soft)  AMBULATORY STATUS COMMUNICATION OF NEEDS Skin   Extensive Assist Verbally Normal                       Personal Care Assistance Level of Assistance  Bathing, Feeding, Dressing Bathing Assistance: Maximum assistance Feeding assistance: Maximum assistance Dressing Assistance: Maximum assistance     Functional Limitations Info  Sight, Hearing, Speech Sight Info: Adequate Hearing Info: Adequate Speech Info: Adequate    SPECIAL CARE FACTORS FREQUENCY  PT (By licensed PT), OT (By licensed OT)     PT Frequency: 3x/wk with home health OT Frequency: 3x/wk with home health             Contractures Contractures Info: Not present    Additional Factors Info  Code Status, Allergies, Psychotropic Code Status Info: DNR Allergies Info: Amoxicillin, Clindamycin/lincomycin, Keflex Cephalexin Psychotropic Info: Aricept 10mg  daily at bed; Cymbalta 30mg  daily         Current Medications (09/27/2018):  This is the current hospital active medication list Current Facility-Administered Medications  Medication Dose Route Frequency Provider Last Rate Last Dose  . acetaminophen (TYLENOL) tablet 325 mg  325 mg Oral TID Cristal Deer, MD   325 mg at 09/27/18 1223  . ciprofloxacin (CIPRO) IVPB 200 mg  200 mg Intravenous BID Rush Barer, RPH 100 mL/hr at 09/27/18 1040 200 mg at 09/27/18 1040  . dextrose 5 %-0.45 % sodium chloride infusion   Intravenous Continuous Mariel Aloe, MD 75 mL/hr at 09/26/18 1747    . donepezil (ARICEPT) tablet 10 mg  10 mg Oral QHS Cristal Deer, MD      . DULoxetine (CYMBALTA) DR capsule 30 mg  30 mg Oral Daily Cristal Deer, MD   30 mg at 09/27/18 1223  . fluticasone (FLONASE) 50 MCG/ACT nasal spray 1 spray  1 spray Each Nare Daily Cristal Deer, MD      . hydrALAZINE (APRESOLINE) injection 5 mg  5 mg Intravenous Q6H PRN Mariel Aloe, MD   5 mg at 09/27/18 0335  . levothyroxine (SYNTHROID, LEVOTHROID) tablet 50 mcg  50 mcg Oral QAC breakfast Cristal Deer, MD      . loratadine (CLARITIN) tablet 10  mg  10 mg Oral Daily Cristal Deer, MD   10 mg at 09/27/18 1223  . losartan (COZAAR) tablet 50 mg  50 mg Oral Daily Cristal Deer, MD   50 mg at 09/27/18 1223  . Melatonin TABS 3 mg  3 mg Oral QHS Cristal Deer, MD      . metoprolol tartrate (LOPRESSOR) tablet 25 mg  25 mg Oral Glori Luis, MD   25 mg at 09/27/18 1224  . polyethylene glycol (MIRALAX / GLYCOLAX) packet 17 g  17 g Oral Daily Cristal Deer, MD   17 g at 09/27/18 1223  . vitamin B-12 (CYANOCOBALAMIN) tablet 1,000 mcg  1,000 mcg Oral Michaela Corner, MD         Discharge Medications: Please see discharge summary for a list of discharge medications.  Relevant Imaging Results:  Relevant Lab Results:   Additional Information SS#: [id number]  Geralynn Ochs, LCSW

## 2018-09-27 NOTE — Progress Notes (Signed)
Pulled out pulse ox on finger. Refused for placement of pulse ox on toes. Oxygen sat stable with VS check.

## 2018-09-28 LAB — GLUCOSE, CAPILLARY
Glucose-Capillary: 121 mg/dL — ABNORMAL HIGH (ref 70–99)
Glucose-Capillary: 93 mg/dL (ref 70–99)
Glucose-Capillary: 107 mg/dL — ABNORMAL HIGH (ref 70–99)

## 2018-09-28 LAB — URINE CULTURE: Culture: >=100000 — AB

## 2018-09-28 MED ORDER — CIPROFLOXACIN HCL 250 MG PO TABS: 500.0000 mg | ORAL_TABLET | Freq: Two times a day (BID) | ORAL | Status: DC

## 2018-09-28 MED ORDER — CIPROFLOXACIN HCL 250 MG PO TABS: 500.0000 mg | ORAL_TABLET | Freq: Two times a day (BID) | ORAL | Status: AC

## 2018-09-28 MED ORDER — CETIRIZINE HCL 10 MG PO TABS: 5.0000 mg | ORAL_TABLET | Freq: Every day | ORAL | Status: DC

## 2018-09-28 NOTE — Progress Notes (Signed)
Pt discharge to [facility] and RN attempted to report but was informed RN Staffs at a meeting and can return call back. RN left telephone number with facility. Pt IV and telemetry removed; awaiting on return call back; will continue to closely monitor. Delia Heady RN

## 2018-09-28 NOTE — Discharge Summary (Signed)
Physician Discharge Summary  [name]:[id number] DOB: [birth year] DOA: 09/25/2018  PCP: Lynnell Catalan, FNP  Admit date: 09/25/2018 Discharge date: 09/28/2018  Admitted From: Memory care Disposition: Memory care  Recommendations for Outpatient Follow-up:  1. Follow up with PCP in 1 week 2. Please obtain BMP/CBC in one week 3. Please follow up on the following pending results: None  Discharge Condition: Stable CODE STATUS: DNR Diet recommendation:   Diet recommendations: Dysphagia 3 (mechanical soft);Thin liquid Liquids provided via: Cup;Straw Medication Administration: Whole meds with liquid Supervision: Staff to assist with self feeding Compensations: Slow rate;Small sips/bites Postural Changes and/or Swallow Maneuvers: Seated upright 90 degrees  Brief/Interim Summary:  Admission HPI written by Cristal Deer, MD   Chief Complaint: Fall at the nursing home  HPI: Julia Carey is a 84 y.o. female with medical history significant for past medical history of dementia hypothyroidism depression generalized muscle weakness, C1 fracture May 26, 2018 she was wearing a neck collar and then the neurosurgeon released the neck collar and started her on soft collar.  Also right wrist fracture about 4 weeks ago she still wearing a cast osteoporosis rheumatoid arthritis who was brought from the nursing facility this morning when patient was found on the floor did not know how long she had been on the floor patient at baseline is confused but talkative and able to communicate and he noted that she was not comfortable as talkative as she used to be.  His par over 20 high-power 20 who is at bedside Mr. Kai Levins stated that the nursing home called him at about 8 AM this morning to report of her fall and the incident.    ED Course: In the ED her blood pressure was noted to be elevated and she was given clevidipine infusion.  She also received 1000 mils of normal saline.   Hydralazine 5 mg.  Epinephrine lidocaine was applied to her area of contusion lorazepam was given for agitation x2    Hospital course:  Subarachnoid hemorrhage Neurosurgery evaluated. No further management.  Acute encephalopathy Worsened on top of underlying cognitive impairment. Possibly secondary to head trauma and subarachnoid hemorrhage versus possible urinary tract infection. Improvement today. Evidence of bacteria in her urine sample. Improved with treatment of UTI.   UTI E. Coli, pan-sensitive. Penicillin/cephalosporin allergy. Treated with ciprofloxacin. Continue on discharge to complete course.  Hypothyroidism Continue Synthroid  Dementia Patient is currently at a memory care unit.  Discharge Diagnoses:  Principal Problem:   Subarachnoid hemorrhage following injury with brief loss of consciousness but without open intracranial wound ([place]) Active Problems:   Hypothyroidism   Dementia ([place])   Fall at home, initial encounter   Accelerated hypertension   Muscle weakness (generalized)   Subarachnoid hemorrhage [facility])    Discharge Instructions  Discharge Instructions    Call MD for:  severe uncontrolled pain   Complete by:  As directed    Increase activity slowly   Complete by:  As directed      Allergies as of 09/28/2018      Reactions   Amoxicillin Other (See Comments)   On MAR   Clindamycin/lincomycin Other (See Comments)   On MAR   Keflex [cephalexin] Other (See Comments)   On MAR      Medication List    STOP taking these medications   aspirin 81 MG tablet     TAKE these medications   acetaminophen 325 MG tablet Commonly known as:  TYLENOL Take 325 mg by  mouth 3 (three) times daily.   cetirizine 10 MG tablet Commonly known as:  ZYRTEC Take 0.5 tablets (5 mg total) by mouth daily. What changed:  how much to take   ciprofloxacin 250 MG tablet Commonly known as:  CIPRO Take 2 tablets (500 mg total) by mouth 2 (two) times daily for 3  days.   donepezil 10 MG tablet Commonly known as:  ARICEPT Take 10 mg by mouth at bedtime.   DULoxetine 30 MG capsule Commonly known as:  CYMBALTA Take 30 mg by mouth daily.   fluticasone 50 MCG/ACT nasal spray Commonly known as:  FLONASE Place 1 spray into both nostrils daily.   levothyroxine 50 MCG tablet Commonly known as:  SYNTHROID, LEVOTHROID Take 50 mcg by mouth daily before breakfast.   losartan 50 MG tablet Commonly known as:  COZAAR Take 50 mg by mouth daily.   Melatonin 3 MG Tabs Take 3 mg by mouth at bedtime.   methocarbamol 500 MG tablet Commonly known as:  ROBAXIN Take 500 mg by mouth every 12 (twelve) hours as needed for muscle spasms.   metoprolol tartrate 25 MG tablet Commonly known as:  LOPRESSOR Take 25 mg by mouth every morning.   OCUSOFT EYELID CLEANSING EX Apply 1 application topically at bedtime.   omeprazole 20 MG capsule Commonly known as:  PRILOSEC Take 20 mg by mouth daily as needed (acid reflux).   polyethylene glycol packet Commonly known as:  MIRALAX / GLYCOLAX Take 17 g by mouth daily.   REFRESH OPTIVE ADVANCED 0.5-1-0.5 % Soln Generic drug:  Carboxymeth-Glycerin-Polysorb Place 1 drop into both eyes 4 (four) times daily.   vitamin B-12 1000 MCG tablet Commonly known as:  CYANOCOBALAMIN Take 1,000 mcg by mouth every other day. Every 2 days      Contact information for after-discharge care    Destination    [facility] .   Service:  Assisted Living Contact information: [place] [phone]             Allergies  Allergen Reactions  . Amoxicillin Other (See Comments)    On MAR  . Clindamycin/Lincomycin Other (See Comments)    On MAR  . Keflex [Cephalexin] Other (See Comments)    On MAR    Consultations:  Neurosurgery   Procedures/Studies: Dg Chest 1 View  Result Date: 09/25/2018 CLINICAL DATA:  Pain after fall. EXAM: CHEST  1 VIEW COMPARISON:   None. FINDINGS: The heart, hila, and mediastinum are normal. No pneumothorax. No pulmonary nodules or masses. No focal infiltrates identified. IMPRESSION: No active disease. Electronically Signed   By: Dorise Bullion III M.D   On: 09/25/2018 11:49   Dg Pelvis 1-2 Views  Result Date: 09/25/2018 CLINICAL DATA:  Pain after fall EXAM: PELVIS - 1-2 VIEW COMPARISON:  None. FINDINGS: There is no evidence of pelvic fracture or diastasis. No pelvic bone lesions are seen. IMPRESSION: Negative. Electronically Signed   By: Dorise Bullion III M.D   On: 09/25/2018 11:52   Dg Elbow 2 Views Left  Result Date: 09/25/2018 CLINICAL DATA:  Fall today.  Posterior left elbow abrasion. EXAM: LEFT ELBOW - 2 VIEW COMPARISON:  None. FINDINGS: There is an IV in the antecubital fossa. The bones appear adequately mineralized. No evidence of acute fracture, dislocation or joint effusion. No focal soft tissue swelling, emphysema or foreign body identified. IMPRESSION: No acute osseous findings. Electronically Signed   By: Richardean Sale M.D.   On: 09/25/2018 13:09  Dg Wrist Complete Right  Result Date: 08/29/2018 CLINICAL DATA:  Right wrist pain following a fall. EXAM: RIGHT WRIST - COMPLETE 3+ VIEW COMPARISON:  None. FINDINGS: Transverse fracture of the distal radial metaphysis with dorsal displacement of a distal fragment and dorsal angulation of the main distal fragment. There may be extension into the radiocarpal joint. The distal ulna is intact. Screw and plate fixation of the 1st metacarpal is noted with degenerative changes at the 1st metacarpal/carpal joint with bony remodeling of the trapezium. IMPRESSION: Distal radius fracture, as described above. Electronically Signed   By: Claudie Revering M.D.   On: 08/29/2018 18:51   Ct Head Wo Contrast  Result Date: 09/25/2018 CLINICAL DATA:  Unwitnessed fall. Combative and nonverbal. EXAM: CT HEAD WITHOUT CONTRAST CT MAXILLOFACIAL WITHOUT CONTRAST CT CERVICAL SPINE WITHOUT  CONTRAST TECHNIQUE: Multidetector CT imaging of the head, cervical spine, and maxillofacial structures were performed using the standard protocol without intravenous contrast. Multiplanar CT image reconstructions of the cervical spine and maxillofacial structures were also generated. COMPARISON:  07/30/2018. FINDINGS: The patient was unable to remain motionless for the exam. Small or subtle lesions could be overlooked. CT HEAD FINDINGS Brain: Advanced atrophy with chronic microvascular ischemic change. Layering intraventricular hemorrhage affecting the RIGHT greater than LEFT lateral ventricles. Mild subarachnoid hemorrhage over the frontal convexity, with a suspected punctate subcortical shearing injury RIGHT frontal lobe, series 3, image 27. No significant subdural collection or midline shift. Slightly greater than 1 cm lipoma of the quadrigeminal plate cistern, eccentric to the RIGHT. Vascular: Calcification of the cavernous internal carotid arteries consistent with cerebrovascular atherosclerotic disease. No signs of intracranial large vessel occlusion. Skull: No fracture. Other: LEFT frontal scalp hematoma, possible laceration. No foreign body. CT MAXILLOFACIAL FINDINGS Osseous: No fracture or mandibular dislocation. No destructive process. Orbits: BILATERAL cataract extraction. No orbital hematoma. Sinuses: Dense LEFT maxillary sinus secretions, likely inspissated rather than hemosinus. Slight layering RIGHT maxillary sinus secretions. No significant layering fluid in the frontal or ethmoid sinuses. Layering LEFT division sphenoid sinus fluid, without features of basilar skull fracture. Soft tissues: Unremarkable. CT CERVICAL SPINE FINDINGS Alignment: Reversal of the normal cervical lordosis, and degenerative slip C3-4 and C4-5 unchanged from priors.Degenerative scoliosis convex RIGHT. Skull base and vertebrae: No acute fracture. Chronic appearing C1 arch deformity. Soft tissues and spinal canal: Significant  pannus surrounds the odontoid, with moderate upper cervical narrowing, but unchanged from October. No canal hematoma. Disc levels: Chronic spondylosis. No posttraumatic disc protrusion is evident. Upper chest: Dolichoectatic vasculature. No upper rib fracture or pneumothorax. Other: Airway appears patent. IMPRESSION: 1. Motion degraded exam demonstrating mild subarachnoid hemorrhage over the frontal convexity, with a suspected punctate subcortical shearing injury RIGHT frontal lobe. Small volume layering intraventricular blood. No significant subdural collection or midline shift. 2. Advanced atrophy and chronic microvascular ischemic change. 3. No facial fracture or blowout injury. Chronic appearing sinus disease. 4. LEFT frontal scalp hematoma, possible laceration. No foreign body. 5. No cervical spine fracture or traumatic subluxation. Chronic spondylosis. 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 6. EDP was unavailable to take the call report, and the finding of small volume intracranial blood was relayed to the ED staff. Electronically Signed   By: Staci Righter M.D.   On: 09/25/2018 12:07   Ct Cervical Spine Wo Contrast  Result Date: 09/25/2018 CLINICAL DATA:  Unwitnessed fall. Combative and nonverbal. EXAM: CT HEAD WITHOUT CONTRAST CT MAXILLOFACIAL WITHOUT CONTRAST CT CERVICAL SPINE WITHOUT CONTRAST TECHNIQUE: Multidetector CT imaging of the head,  cervical spine, and maxillofacial structures were performed using the standard protocol without intravenous contrast. Multiplanar CT image reconstructions of the cervical spine and maxillofacial structures were also generated. COMPARISON:  07/30/2018. FINDINGS: The patient was unable to remain motionless for the exam. Small or subtle lesions could be overlooked. CT HEAD FINDINGS Brain: Advanced atrophy with chronic microvascular ischemic change. Layering intraventricular hemorrhage affecting the RIGHT greater than LEFT lateral  ventricles. Mild subarachnoid hemorrhage over the frontal convexity, with a suspected punctate subcortical shearing injury RIGHT frontal lobe, series 3, image 27. No significant subdural collection or midline shift. Slightly greater than 1 cm lipoma of the quadrigeminal plate cistern, eccentric to the RIGHT. Vascular: Calcification of the cavernous internal carotid arteries consistent with cerebrovascular atherosclerotic disease. No signs of intracranial large vessel occlusion. Skull: No fracture. Other: LEFT frontal scalp hematoma, possible laceration. No foreign body. CT MAXILLOFACIAL FINDINGS Osseous: No fracture or mandibular dislocation. No destructive process. Orbits: BILATERAL cataract extraction. No orbital hematoma. Sinuses: Dense LEFT maxillary sinus secretions, likely inspissated rather than hemosinus. Slight layering RIGHT maxillary sinus secretions. No significant layering fluid in the frontal or ethmoid sinuses. Layering LEFT division sphenoid sinus fluid, without features of basilar skull fracture. Soft tissues: Unremarkable. CT CERVICAL SPINE FINDINGS Alignment: Reversal of the normal cervical lordosis, and degenerative slip C3-4 and C4-5 unchanged from priors.Degenerative scoliosis convex RIGHT. Skull base and vertebrae: No acute fracture. Chronic appearing C1 arch deformity. Soft tissues and spinal canal: Significant pannus surrounds the odontoid, with moderate upper cervical narrowing, but unchanged from October. No canal hematoma. Disc levels: Chronic spondylosis. No posttraumatic disc protrusion is evident. Upper chest: Dolichoectatic vasculature. No upper rib fracture or pneumothorax. Other: Airway appears patent. IMPRESSION: 1. Motion degraded exam demonstrating mild subarachnoid hemorrhage over the frontal convexity, with a suspected punctate subcortical shearing injury RIGHT frontal lobe. Small volume layering intraventricular blood. No significant subdural collection or midline shift. 2.  Advanced atrophy and chronic microvascular ischemic change. 3. No facial fracture or blowout injury. Chronic appearing sinus disease. 4. LEFT frontal scalp hematoma, possible laceration. No foreign body. 5. No cervical spine fracture or traumatic subluxation. Chronic spondylosis. 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 6. EDP was unavailable to take the call report, and the finding of small volume intracranial blood was relayed to the ED staff. Electronically Signed   By: Staci Righter M.D.   On: 09/25/2018 12:07   Dg Femur Min 2 Views Left  Result Date: 09/25/2018 CLINICAL DATA:  Pain after trauma EXAM: LEFT FEMUR 2 VIEWS COMPARISON:  None. FINDINGS: There is no evidence of fracture or other focal bone lesions. Soft tissues are unremarkable. IMPRESSION: Negative. Electronically Signed   By: Dorise Bullion III M.D   On: 09/25/2018 11:51   Ct Maxillofacial Wo Contrast  Result Date: 09/25/2018 CLINICAL DATA:  Unwitnessed fall. Combative and nonverbal. EXAM: CT HEAD WITHOUT CONTRAST CT MAXILLOFACIAL WITHOUT CONTRAST CT CERVICAL SPINE WITHOUT CONTRAST TECHNIQUE: Multidetector CT imaging of the head, cervical spine, and maxillofacial structures were performed using the standard protocol without intravenous contrast. Multiplanar CT image reconstructions of the cervical spine and maxillofacial structures were also generated. COMPARISON:  07/30/2018. FINDINGS: The patient was unable to remain motionless for the exam. Small or subtle lesions could be overlooked. CT HEAD FINDINGS Brain: Advanced atrophy with chronic microvascular ischemic change. Layering intraventricular hemorrhage affecting the RIGHT greater than LEFT lateral ventricles. Mild subarachnoid hemorrhage over the frontal convexity, with a suspected punctate subcortical shearing injury RIGHT frontal lobe, series  3, image 27. No significant subdural collection or midline shift. Slightly greater than 1 cm lipoma of the  quadrigeminal plate cistern, eccentric to the RIGHT. Vascular: Calcification of the cavernous internal carotid arteries consistent with cerebrovascular atherosclerotic disease. No signs of intracranial large vessel occlusion. Skull: No fracture. Other: LEFT frontal scalp hematoma, possible laceration. No foreign body. CT MAXILLOFACIAL FINDINGS Osseous: No fracture or mandibular dislocation. No destructive process. Orbits: BILATERAL cataract extraction. No orbital hematoma. Sinuses: Dense LEFT maxillary sinus secretions, likely inspissated rather than hemosinus. Slight layering RIGHT maxillary sinus secretions. No significant layering fluid in the frontal or ethmoid sinuses. Layering LEFT division sphenoid sinus fluid, without features of basilar skull fracture. Soft tissues: Unremarkable. CT CERVICAL SPINE FINDINGS Alignment: Reversal of the normal cervical lordosis, and degenerative slip C3-4 and C4-5 unchanged from priors.Degenerative scoliosis convex RIGHT. Skull base and vertebrae: No acute fracture. Chronic appearing C1 arch deformity. Soft tissues and spinal canal: Significant pannus surrounds the odontoid, with moderate upper cervical narrowing, but unchanged from October. No canal hematoma. Disc levels: Chronic spondylosis. No posttraumatic disc protrusion is evident. Upper chest: Dolichoectatic vasculature. No upper rib fracture or pneumothorax. Other: Airway appears patent. IMPRESSION: 1. Motion degraded exam demonstrating mild subarachnoid hemorrhage over the frontal convexity, with a suspected punctate subcortical shearing injury RIGHT frontal lobe. Small volume layering intraventricular blood. No significant subdural collection or midline shift. 2. Advanced atrophy and chronic microvascular ischemic change. 3. No facial fracture or blowout injury. Chronic appearing sinus disease. 4. LEFT frontal scalp hematoma, possible laceration. No foreign body. 5. No cervical spine fracture or traumatic subluxation.  Chronic spondylosis. 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 6. EDP was unavailable to take the call report, and the finding of small volume intracranial blood was relayed to the ED staff. Electronically Signed   By: Staci Righter M.D.   On: 09/25/2018 12:07      Subjective: No concerns today per patient.  Discharge Exam: Vitals:   09/28/18 0910 09/28/18 1221  BP: (!) 156/62 (!) 144/74  Pulse: 92 69  Resp: 18 16  Temp: 98 F (36.7 C) 97.8 F (36.6 C)  SpO2: 95% 97%   Vitals:   09/28/18 0016 09/28/18 0422 09/28/18 0910 09/28/18 1221  BP: (!) 164/80 (!) 184/85 (!) 156/62 (!) 144/74  Pulse: 80 89 92 69  Resp: 20 18 18 16   Temp: 98 F (36.7 C) 98.2 F (36.8 C) 98 F (36.7 C) 97.8 F (36.6 C)  TempSrc: Oral Oral Oral Oral  SpO2: 97% 94% 95% 97%    General: Pt is alert, awake, not in acute distress Cardiovascular: RRR, S1/S2 +, no rubs, no gallops Respiratory: CTA bilaterally, no wheezing, no rhonchi Abdominal: Soft, NT, ND, bowel sounds + Extremities: no edema, no cyanosis    The results of significant diagnostics from this hospitalization (including imaging, microbiology, ancillary and laboratory) are listed below for reference.     Microbiology: Recent Results (from the past 240 hour(s))  Culture, Urine     Status: Abnormal   Collection Time: 09/25/18  3:46 PM  Result Value Ref Range Status   Specimen Description URINE, RANDOM  Final   Special Requests ADDED 09/26/18 0532  Final   Culture >=100,000 COLONIES/mL ESCHERICHIA COLI (A)  Final   Report Status 09/28/2018 FINAL  Final   Organism ID, Bacteria ESCHERICHIA COLI (A)  Final      Susceptibility   Escherichia coli - MIC*    AMPICILLIN 8 SENSITIVE Sensitive  CEFAZOLIN <=4 SENSITIVE Sensitive     CEFTRIAXONE <=1 SENSITIVE Sensitive     CIPROFLOXACIN <=0.25 SENSITIVE Sensitive     GENTAMICIN <=1 SENSITIVE Sensitive     IMIPENEM <=0.25 SENSITIVE Sensitive     NITROFURANTOIN  <=16 SENSITIVE Sensitive     TRIMETH/SULFA <=20 SENSITIVE Sensitive     AMPICILLIN/SULBACTAM 4 SENSITIVE Sensitive     PIP/TAZO <=4 SENSITIVE Sensitive     Extended ESBL NEGATIVE Sensitive     * >=100,000 COLONIES/mL ESCHERICHIA COLI  MRSA PCR Screening     Status: None   Collection Time: 09/26/18  3:56 AM  Result Value Ref Range Status   MRSA by PCR NEGATIVE NEGATIVE Final    Comment:        The GeneXpert MRSA Assay (FDA approved for NASAL specimens only), is one component of a comprehensive MRSA colonization surveillance program. It is not intended to diagnose MRSA infection nor to guide or monitor treatment for MRSA infections. Performed at [facility], [place] [address] [place], [place] [address]      Labs: BNP (last 3 results) No results for input(s): BNP in the last 8760 hours. Basic Metabolic Panel: Recent Labs  Lab 09/25/18 0853 09/26/18 0539  NA 142 140  K 3.7 4.1  CL 108 106  CO2 26 22  GLUCOSE 150* 105*  BUN 22 20  CREATININE 1.30* 1.12*  CALCIUM 10.0 9.8   Liver Function Tests: Recent Labs  Lab 09/25/18 0853  AST 24  ALT 15  ALKPHOS 109  BILITOT 0.7  PROT 6.7  ALBUMIN 3.6   No results for input(s): LIPASE, AMYLASE in the last 168 hours. No results for input(s): AMMONIA in the last 168 hours. CBC: Recent Labs  Lab 09/25/18 0853  WBC 9.4  HGB 11.9*  HCT 39.3  MCV 102.9*  PLT 258   Cardiac Enzymes: Recent Labs  Lab 09/25/18 0906  CKTOTAL 94   BNP: Invalid input(s): POCBNP CBG: Recent Labs  Lab 09/27/18 1133 09/27/18 1800 09/27/18 2326 09/28/18 0601 09/28/18 1144  GLUCAP 157* 127* 125* 121* 93   D-Dimer No results for input(s): DDIMER in the last 72 hours. Hgb A1c No results for input(s): HGBA1C in the last 72 hours. Lipid Profile No results for input(s): CHOL, HDL, LDLCALC, TRIG, CHOLHDL, LDLDIRECT in the last 72 hours. Thyroid function studies No results for input(s): TSH, T4TOTAL, T3FREE, THYROIDAB in the last 72  hours.  Invalid input(s): FREET3 Anemia work up No results for input(s): VITAMINB12, FOLATE, FERRITIN, TIBC, IRON, RETICCTPCT in the last 72 hours. Urinalysis    Component Value Date/Time   COLORURINE YELLOW 09/25/2018 1546   APPEARANCEUR TURBID (A) 09/25/2018 1546   LABSPEC 1.011 09/25/2018 1546   PHURINE 8.0 09/25/2018 1546   GLUCOSEU NEGATIVE 09/25/2018 1546   HGBUR SMALL (A) 09/25/2018 1546   BILIRUBINUR NEGATIVE 09/25/2018 1546   KETONESUR NEGATIVE 09/25/2018 1546   PROTEINUR NEGATIVE 09/25/2018 1546   NITRITE POSITIVE (A) 09/25/2018 1546   LEUKOCYTESUR LARGE (A) 09/25/2018 1546   Sepsis Labs Invalid input(s): PROCALCITONIN,  WBC,  LACTICIDVEN Microbiology Recent Results (from the past 240 hour(s))  Culture, Urine     Status: Abnormal   Collection Time: 09/25/18  3:46 PM  Result Value Ref Range Status   Specimen Description URINE, RANDOM  Final   Special Requests ADDED 09/26/18 0532  Final   Culture >=100,000 COLONIES/mL ESCHERICHIA COLI (A)  Final   Report Status 09/28/2018 FINAL  Final   Organism ID, Bacteria ESCHERICHIA COLI (A)  Final      Susceptibility   Escherichia coli - MIC*    AMPICILLIN 8 SENSITIVE Sensitive     CEFAZOLIN <=4 SENSITIVE Sensitive     CEFTRIAXONE <=1 SENSITIVE Sensitive     CIPROFLOXACIN <=0.25 SENSITIVE Sensitive     GENTAMICIN <=1 SENSITIVE Sensitive     IMIPENEM <=0.25 SENSITIVE Sensitive     NITROFURANTOIN <=16 SENSITIVE Sensitive     TRIMETH/SULFA <=20 SENSITIVE Sensitive     AMPICILLIN/SULBACTAM 4 SENSITIVE Sensitive     PIP/TAZO <=4 SENSITIVE Sensitive     Extended ESBL NEGATIVE Sensitive     * >=100,000 COLONIES/mL ESCHERICHIA COLI  MRSA PCR Screening     Status: None   Collection Time: 09/26/18  3:56 AM  Result Value Ref Range Status   MRSA by PCR NEGATIVE NEGATIVE Final    Comment:        The GeneXpert MRSA Assay (FDA approved for NASAL specimens only), is one component of a comprehensive MRSA colonization surveillance  program. It is not intended to diagnose MRSA infection nor to guide or monitor treatment for MRSA infections. Performed at [facility], [place] [address], [place], [place] [address]     SIGNED:   Cordelia Poche, MD [facility] 09/28/2018, 2:28 PM

## 2018-09-28 NOTE — Progress Notes (Signed)
Physical Therapy Treatment Patient Details Name: Julia Carey MRN: [id number] DOB: Jun 04, 1928 Today's Date: 09/28/2018    History of Present Illness 84 y.o. female with medical history significant for past medical history of dementia hypothyroidism depression generalized muscle weakness, C1 fracture May 26, 2018 she was wearing a neck collar and then the neurosurgeon released the neck collar and started her on soft collar.  Also right wrist fracture about 4 weeks ago she still wearing a cast osteoporosis rheumatoid arthritis who was brought from the nursing facility when patient was found on the floor CT head personally reviewed, which shows quadrigeminal plate lipoma, small area of IVH, small areas of tSAH.    PT Comments    Pt remains impulsive with mobility and only oriented to herself.  She was eager to move from bed to recliner chair.  Pt slow and presents with posterior lean.  Pt continues to benefit from skilled rehab in a post acute setting to improve strength and function.  Plan next session for progression of ambulation.      Follow Up Recommendations  SNF;Supervision/Assistance - 24 hour     Equipment Recommendations  (TBD)    Recommendations for Other Services       Precautions / Restrictions Precautions Precautions: Fall Precaution Comments: casted right wrist from fx ~4wks ago Required Braces or Orthoses: Cervical Brace Cervical Brace: Soft collar(wears for comfort only, family trying to get away from use of it.  ) Restrictions Weight Bearing Restrictions: No    Mobility  Bed Mobility Overal bed mobility: Needs Assistance Bed Mobility: Supine to Sit     Supine to sit: Mod assist     General bed mobility comments: Cues for hand placement to push into sitting.  Pt able to advance LEs to edge of bed and but required mod assistance to elevate trunk into sitting.    Transfers Overall transfer level: Needs assistance Equipment used: 1 person hand held  assist Transfers: Sit to/from Stand Sit to Stand: Mod assist;+2 physical assistance         General transfer comment: Pt pulling into standing on PTA for support.  She continues to present with posterio lean, required facilitation for anterior weight shift forward.    Ambulation/Gait Ambulation/Gait assistance: (steps to chair with max +2.  )               Stairs             Wheelchair Mobility    Modified Rankin (Stroke Patients Only)       Balance Overall balance assessment: Needs assistance;History of Falls   Sitting balance-Leahy Scale: Poor       Standing balance-Leahy Scale: Poor                              Cognition Arousal/Alertness: Awake/alert Behavior During Therapy: Restless;Impulsive Overall Cognitive Status: No family/caregiver present to determine baseline cognitive functioning Area of Impairment: Following commands;Safety/judgement;Problem solving;Awareness;Memory;Attention;Orientation                 Orientation Level: Disoriented to;Place;Time;Situation Current Attention Level: Focused Memory: Decreased recall of precautions;Decreased short-term memory Following Commands: Follows one step commands with increased time Safety/Judgement: Decreased awareness of safety;Decreased awareness of deficits Awareness: Intellectual Problem Solving: Slow processing;Decreased initiation;Difficulty sequencing;Requires verbal cues;Requires tactile cues        Exercises      General Comments        Pertinent Vitals/Pain Pain Assessment: No/denies  pain    Home Living                      Prior Function            PT Goals (current goals can now be found in the care plan section) Acute Rehab PT Goals Patient Stated Goal: none stated PT Goal Formulation: Patient unable to participate in goal setting Potential to Achieve Goals: Fair Progress towards PT goals: Progressing toward goals    Frequency    Min  3X/week      PT Plan Current plan remains appropriate    Co-evaluation              AM-PAC PT "6 Clicks" Mobility   Outcome Measure  Help needed turning from your back to your side while in a flat bed without using bedrails?: A Lot Help needed moving from lying on your back to sitting on the side of a flat bed without using bedrails?: A Lot Help needed moving to and from a bed to a chair (including a wheelchair)?: A Lot Help needed standing up from a chair using your arms (e.g., wheelchair or bedside chair)?: A Lot Help needed to walk in hospital room?: A Lot Help needed climbing 3-5 steps with a railing? : Total 6 Click Score: 11    End of Session   Activity Tolerance: Patient limited by fatigue;Patient limited by lethargy Patient left: in chair;with call bell/phone within reach;with bed alarm set Nurse Communication: Mobility status PT Visit Diagnosis: Other symptoms and signs involving the nervous system (R29.898);Difficulty in walking, not elsewhere classified (R26.2)     Time: 4035-2481 PT Time Calculation (min) (ACUTE ONLY): 12 min  Charges:  $Therapeutic Activity: 8-22 mins                     Governor Rooks, PTA Acute Rehabilitation Services Pager [phone] Office [phone]   Marise Knapper Eli Hose 09/28/2018, 4:37 PM

## 2018-09-28 NOTE — Progress Notes (Signed)
Patient will DC to: [facility] Anticipated DC date: 09/28/18 Family notified: [name], nephew Transport by: Corey Harold   Per MD patient ready for DC to [facility]. RN, patient, patient's family, and facility notified of DC. Discharge Summary and FL2 sent to facility. RN to call report prior to discharge [phone]). DC packet on chart. Ambulance transport requested for patient.   CSW will sign off for now as social work intervention is no longer needed. Please consult [place] again if new needs arise.  Cedric Fishman, LCSW Clinical Social Worker [phone]

## 2018-09-28 NOTE — Progress Notes (Signed)
CSW contacted [facility] and spoke with care coordinator, sent information for review. Memory Care coordinator will call CSW back after review to confirm that they can take the patient back. CSW informed them that MD and family is hopeful for discharge today.  CSW to follow.  Laveda Abbe, [place] Clinical Social Worker [phone]

## 2018-09-28 NOTE — Discharge Instructions (Addendum)
Subarachnoid Hemorrhage Subarachnoid hemorrhage is bleeding in the area between the brain and the membrane that covers the brain (subarachnoid space). This increases the pressure on the brain and causes some areas of the brain to be deprived of blood flow. Subarachnoid hemorrhage is a medical emergency that may cause permanent brain damage, stroke, or even death if not treated. What are the causes?  Head injury.  Ruptured brain aneurysm.  Bleeding from blood vessels that develop abnormally (arteriovenous malformation).  Bleeding disorder.  Use of blood thinners (anticoagulants).  Use of certain drugs, such as cocaine. For some people with subarachnoid hemorrhage, the cause is unknown. What increases the risk?  Smoking.  Having high blood pressure (hypertension).  Abusing alcohol.  Being a female, especially being of post-menopausal age.  Having a family history of disease in the blood vessels of the brain (cerebrovascular disease).  Having certain genetic syndromes that result in kidney disease or connective tissue disease. What are the signs or symptoms?  A sudden, severe headache with no known cause. The headache is often described as the worst headache ever experienced.  Nausea or vomiting, especially when combined with other symptoms such as a headache.  Sudden weakness or numbness of the face, arm, or leg, especially on one side of the body.  Sudden trouble walking or difficulty moving arms or legs.  Sudden confusion.  Sudden personality changes.  Trouble speaking (aphasia) or understanding.  Difficulty swallowing.  Sudden trouble seeing in one or both eyes.  Double vision.  Dizziness.  Loss of balance or coordination.  Intolerance to light.  Stiff neck. How is this diagnosed? Your health care provider will perform a physical exam and ask about your symptoms. If a subarachnoid hemorrhage is suspected, various tests may be ordered. These tests may  include:  A CT scan.  An MRI.  A cerebral angiogram.  A spinal tap (lumbar puncture).  Blood tests.  How is this treated? Immediate treatment in the hospital is often required to reduce the risk of brain damage. Treatment will depend on the cause of the bleeding, where it is located, and the extent of the bleeding and damage. The goals of treatment include stopping the bleeding, repairing the cause of bleeding, providing relief of symptoms, and preventing problems.  Medicines may be given to: ? Lower blood pressure (antihypertensives). ? Relieve pain (analgesics). ? Relieve nausea or vomiting.  Surgery may also be needed to stop the bleeding, repair the cause of the bleeding, or remove the blood.  Rehabilitation may be needed to improve any cognitive and day-to-day functions impaired by the condition.  Further treatment depends on the duration, severity, and cause of your symptoms. Physical, speech, and occupational therapists will assess you and work to improve any functions impaired by the subarachnoid hemorrhage. Measures will be taken to prevent short-term and long-term problems, including infection from breathing foreign material into the lungs (aspiration pneumonia), blood clots in the legs, bedsores, and falls. Follow these instructions at home: After your hospitalization or inpatient rehabilitation is completed and you are well enough to go home, it is important to prevent a reoccurrence. Take these steps to help prevent this:  Take medicines only as directed by your health care provider.  If swallow studies have determined that your swallowing reflex is present, you should eat healthy foods. A diet low in salt (sodium), saturated fat, trans fat, and cholesterol may be recommended to manage high blood pressure. Foods may need to be a special consistency (soft or pureed), or  small bites may need to be taken in order to avoid aspirating or choking.  Rest and limit activities or  movements as directed by your health care provider.  Do not use any tobacco products including cigarettes, chewing tobacco, or electronic cigarettes. If you need help quitting, ask your health care provider.  Limit alcohol intake to no more than 1 drink per day for nonpregnant women and 2 drinks per day for men. One drink equals 12 ounces of beer, 5 ounces of wine, or 1 ounces of hard liquor.  Make any other lifestyle changes as directed by your health care provider.  Monitor and record your blood pressure as directed by your health care provider.  A safe home environment is important to reduce the risk of falls. Your health care provider may arrange for specialists to evaluate your home. Having grab bars in the bedroom and bathroom is often important. Your health care provider may arrange for special equipment to be used at home, such as raised toilets and a seat for the shower.  Physical, occupational, and speech therapy. Ongoing therapy may be needed to maximize your recovery after a subarachnoid bleed. If you have been advised to use a walker or a cane, use it at all times. Be sure to keep your therapy appointments.  Keep all follow-up visits with your health care provider and other specialists. This includes any referrals, physical therapy, and rehabilitation.  Get help right away if:  You suddenly have a sudden, severe headache with no known cause.  You have nausea or vomiting occurring with another symptom.  You have sudden weakness or numbness of the face, arm, or leg, especially on one side of the body.  You have sudden trouble walking or difficulty moving arms or legs.  You have sudden confusion.  You have trouble speaking (aphasia) or understanding.  You have sudden trouble seeing in one or both eyes.  You have a sudden loss of balance or coordination.  You have a stiff neck.  You have difficulty breathing.  You have a partial or total loss of consciousness. Any of  these symptoms may represent a serious problem that is an emergency. Do not wait to see if the symptoms will go away. Get medical help right away. Call your local emergency services (911 in [place] Do not drive yourself to the hospital. This information is not intended to replace advice given to you by your health care provider. Make sure you discuss any questions you have with your health care provider. Document Released: 08/16/2004 Document Revised: 03/06/2016 Document Reviewed: 11/12/2012 Elsevier Interactive Patient Education  [employer].

## 2018-09-28 NOTE — Progress Notes (Signed)
Report called off to nurse Shawnna at the SNF. Pt continue to await on [facility] to transport to disposition. Will continue to closely monitor till pt picked up. P. Angelica Pou RN

## 2018-09-28 NOTE — Progress Notes (Signed)
Pt picked up by [facility] and transported off unit via stretcher to be transported to disposition. Delia Heady RN

## 2019-01-23 ENCOUNTER — Emergency Department (HOSPITAL_COMMUNITY): Payer: Medicare Other

## 2019-01-23 ENCOUNTER — Other Ambulatory Visit: Payer: Self-pay

## 2019-01-23 ENCOUNTER — Emergency Department (HOSPITAL_COMMUNITY)
Admission: EM | Admit: 2019-01-23 | Discharge: 2019-01-23 | Disposition: A | Discharge status: Home / Self Care | Payer: Medicare Other | Attending: Emergency Medicine | Admitting: Emergency Medicine

## 2019-01-23 ENCOUNTER — Encounter (HOSPITAL_COMMUNITY): Payer: Self-pay

## 2019-01-23 DIAGNOSIS — W19XXXA Unspecified fall, initial encounter: Secondary | ICD-10-CM | POA: Diagnosis not present

## 2019-01-23 DIAGNOSIS — Y939 Activity, unspecified: Secondary | ICD-10-CM | POA: Diagnosis not present

## 2019-01-23 DIAGNOSIS — S6991XA Unspecified injury of right wrist, hand and finger(s), initial encounter: Secondary | ICD-10-CM | POA: Insufficient documentation

## 2019-01-23 DIAGNOSIS — F0390 Unspecified dementia without behavioral disturbance: Secondary | ICD-10-CM | POA: Insufficient documentation

## 2019-01-23 DIAGNOSIS — Y9210 Unspecified residential institution as the place of occurrence of the external cause: Secondary | ICD-10-CM | POA: Insufficient documentation

## 2019-01-23 DIAGNOSIS — Z79899 Other long term (current) drug therapy: Secondary | ICD-10-CM | POA: Insufficient documentation

## 2019-01-23 DIAGNOSIS — S0181XA Laceration without foreign body of other part of head, initial encounter: Secondary | ICD-10-CM

## 2019-01-23 DIAGNOSIS — E039 Hypothyroidism, unspecified: Secondary | ICD-10-CM | POA: Insufficient documentation

## 2019-01-23 DIAGNOSIS — Y999 Unspecified external cause status: Secondary | ICD-10-CM | POA: Diagnosis not present

## 2019-01-23 DIAGNOSIS — S0990XA Unspecified injury of head, initial encounter: Secondary | ICD-10-CM | POA: Diagnosis present

## 2019-01-23 LAB — CBG MONITORING, ED
Glucose-Capillary: 68 mg/dL — ABNORMAL LOW (ref 70–99)
Glucose-Capillary: 95 mg/dL (ref 70–99)

## 2019-01-23 LAB — URINALYSIS, ROUTINE W REFLEX MICROSCOPIC
Bilirubin Urine: NEGATIVE
Glucose, UA: NEGATIVE mg/dL
Hgb urine dipstick: NEGATIVE
Ketones, ur: NEGATIVE mg/dL
Leukocytes,Ua: NEGATIVE
Nitrite: NEGATIVE
Protein, ur: NEGATIVE mg/dL
Specific Gravity, Urine: 1.014 (ref 1.005–1.030)
pH: 7 (ref 5.0–8.0)

## 2019-01-23 LAB — CBC WITH DIFFERENTIAL/PLATELET
Abs Immature Granulocytes: 0.01 10*3/uL (ref 0.00–0.07)
Basophils Absolute: 0.1 10*3/uL (ref 0.0–0.1)
Basophils Relative: 1 %
Eosinophils Absolute: 0.2 10*3/uL (ref 0.0–0.5)
Eosinophils Relative: 3 %
HCT: 41.3 % (ref 36.0–46.0)
Hemoglobin: 12.8 g/dL (ref 12.0–15.0)
Immature Granulocytes: 0 %
Lymphocytes Relative: 27 %
Lymphs Abs: 1.8 10*3/uL (ref 0.7–4.0)
MCH: 31.8 pg (ref 26.0–34.0)
MCHC: 31 g/dL (ref 30.0–36.0)
MCV: 102.7 fL — ABNORMAL HIGH (ref 80.0–100.0)
Monocytes Absolute: 0.7 10*3/uL (ref 0.1–1.0)
Monocytes Relative: 10 %
Neutro Abs: 4 10*3/uL (ref 1.7–7.7)
Neutrophils Relative %: 59 %
Platelets: 291 10*3/uL (ref 150–400)
RBC: 4.02 MIL/uL (ref 3.87–5.11)
RDW: 13.6 % (ref 11.5–15.5)
WBC: 6.8 10*3/uL (ref 4.0–10.5)
nRBC: 0 % (ref 0.0–0.2)

## 2019-01-23 LAB — COMPREHENSIVE METABOLIC PANEL
ALT: 16 U/L (ref 0–44)
AST: 23 U/L (ref 15–41)
Albumin: 4 g/dL (ref 3.5–5.0)
Alkaline Phosphatase: 115 U/L (ref 38–126)
Anion gap: 7 (ref 5–15)
BUN: 25 mg/dL — ABNORMAL HIGH (ref 8–23)
CO2: 28 mmol/L (ref 22–32)
Calcium: 10.3 mg/dL (ref 8.9–10.3)
Chloride: 104 mmol/L (ref 98–111)
Creatinine, Ser: 1.35 mg/dL — ABNORMAL HIGH (ref 0.44–1.00)
GFR calc Af Amer: 41 mL/min — ABNORMAL LOW (ref >60)
GFR calc non Af Amer: 35 mL/min — ABNORMAL LOW (ref >60)
Glucose, Bld: 97 mg/dL (ref 70–99)
Potassium: 4.6 mmol/L (ref 3.5–5.1)
Sodium: 139 mmol/L (ref 135–145)
Total Bilirubin: 0.7 mg/dL (ref 0.3–1.2)
Total Protein: 7.5 g/dL (ref 6.5–8.1)

## 2019-01-23 NOTE — ED Notes (Signed)
Bed: JJ00 Expected date:  Expected time:  Means of arrival:  Comments: 85 yo fall

## 2019-01-23 NOTE — ED Provider Notes (Signed)
[facility] DEPT Provider Note   CSN: [id number] Arrival date & time: 01/23/19  1246    History   Chief Complaint Chief Complaint  Patient presents with   Fall    HPI Julia Carey is a 85 y.o. female presenting for evaluation after fall.  Level 5 caveat, patient with a history of dementia.  Per EMS note, patient presenting after a fall.  Fall was unwitnessed, patient was in the bathroom.  Staff heard patient fall, and immediately found her on the ground.  No known loss of consciousness.  Patient with a small lack on the back of her head.  No injury noted elsewhere.  Per staff, patient with a history of dementia, mental status at baseline.  Pt denies pain. She does not know what happened to cause her to fall. She does remember falling, however.   Additional history obtained from chart review.  Patient is not on anticoagulation.  History of subarachnoid hemorrhage following a fall 4 months ago.  Pr paperwork, pt has had her medications (including BP meds) today.     HPI  Past Medical History:  Diagnosis Date   Age-related osteoporosis without current pathological fracture    Dementia ([place])    Dysphagia    Fracture of first cervical vertebra ([place])    Hypothyroidism    Insomnia    Major depression, single episode    Muscle weakness (generalized)     Patient Active Problem List   Diagnosis Date Noted   Hypothyroidism 09/25/2018   Dementia ([place]) 09/25/2018   Fall at home, initial encounter 09/25/2018   Subarachnoid hemorrhage following injury with brief loss of consciousness but without open intracranial wound ([place]) 09/25/2018   Accelerated hypertension 09/25/2018   Muscle weakness (generalized) 09/25/2018   Subarachnoid hemorrhage ([place]) 09/25/2018    History reviewed. No pertinent surgical history.   OB History   No obstetric history on file.      Home Medications    Prior to Admission medications   Medication  Sig Start Date End Date Taking? Authorizing Provider  acetaminophen (TYLENOL) 325 MG tablet Take 325 mg by mouth 3 (three) times daily.    Yes [provider]  Carboxymeth-Glycerin-Polysorb (REFRESH OPTIVE ADVANCED) 0.5-1-0.5 % SOLN Place 1 drop into both eyes 2 (two) times daily.    Yes [provider]  cetirizine (ZYRTEC) 5 MG tablet Take 5 mg by mouth daily.   Yes [provider]  donepezil (ARICEPT) 10 MG tablet Take 10 mg by mouth at bedtime.    Yes [provider]  DULoxetine (CYMBALTA) 30 MG capsule Take 30 mg by mouth daily.   Yes [provider]  Eyelid Cleansers (OCUSOFT EYELID CLEANSING EX) Apply 1 application topically daily.    Yes [provider]  fluticasone (FLONASE) 50 MCG/ACT nasal spray Place 1 spray into both nostrils daily.   Yes [provider]  levothyroxine (SYNTHROID, LEVOTHROID) 50 MCG tablet Take 50 mcg by mouth daily.    Yes [provider]  losartan (COZAAR) 50 MG tablet Take 50 mg by mouth daily.   Yes [provider]  Melatonin 3 MG TABS Take 3 mg by mouth at bedtime.   Yes [provider]  methocarbamol (ROBAXIN) 500 MG tablet Take 500 mg by mouth every 12 (twelve) hours as needed for muscle spasms.   Yes [provider]  metoprolol tartrate (LOPRESSOR) 25 MG tablet Take 25 mg by mouth every morning.    Yes [provider]  omeprazole (PRILOSEC) 20 MG capsule Take 20 mg by mouth daily as needed (acid reflux).    Yes [provider]  polyethylene glycol (MIRALAX / GLYCOLAX) packet Take 17 g by mouth daily.   Yes [provider]  vitamin B-12 (CYANOCOBALAMIN) 1000 MCG tablet Take 1,000 mcg by mouth every other day. Every 2 days    Yes [provider]    Family History History reviewed. No pertinent family history.  Social History Social History   Tobacco Use   Smoking status: Unknown If Ever Smoked   Smokeless tobacco: Never  Used  Substance Use Topics   Alcohol use: Not Currently   Drug use: Not Currently     Allergies   Amoxicillin; Clindamycin/lincomycin; and Keflex [cephalexin]   Review of Systems Review of Systems  Unable to perform ROS: Dementia     Physical Exam Updated Vital Signs BP (!) 182/87    Pulse 62    Temp 97.9 F (36.6 C) (Oral)    Resp 15    SpO2 98%   Physical Exam Vitals signs and nursing note reviewed.  Constitutional:      General: She is not in acute distress.    Appearance: She is well-developed.     Comments: Elderly female resting comfortably in the bed in NAD  HENT:     Head: Normocephalic.     Comments: Small, 1 in lac to occiput without active bleeding. No other head injury noted. No hemotympanum or nasal septal hematomas.  Eyes:     Conjunctiva/sclera: Conjunctivae normal.     Pupils: Pupils are equal, round, and reactive to light.     Comments: PERRLA  Neck:     Musculoskeletal: Normal range of motion and neck supple.     Comments: No ttp of the c-spine. In c-collar Cardiovascular:     Rate and Rhythm: Normal rate and regular rhythm.     Pulses: Normal pulses.  Pulmonary:     Effort: Pulmonary effort is normal. No respiratory distress.     Breath sounds: Normal breath sounds. No wheezing.  Abdominal:     General: There is no distension.     Palpations: Abdomen is soft. There is no mass.     Tenderness: There is no abdominal tenderness. There is no guarding or rebound.     Comments: No ttp of the abd  Musculoskeletal:        General: Deformity present.     Comments: No obvious injury or deformity to the back. No ttp of midline spine.  Bruising and deformity of distal R index finger at DIP.  Pulses equal and intact x4.  No ttp of the pelvis No obvious deformity or tenderness of lower extremities.   Skin:    General: Skin is warm and dry.     Capillary Refill: Capillary refill takes less than 2 seconds.  Neurological:     Mental Status: She is  alert.     Comments: Alert to person and place      ED Treatments / Results  Labs (all labs ordered are listed, but only abnormal results are displayed) Labs Reviewed  CBC WITH DIFFERENTIAL/PLATELET - Abnormal; Notable for the following components:      Result Value   MCV 102.7 (*)    All other components within normal limits  COMPREHENSIVE METABOLIC PANEL - Abnormal; Notable for the following components:   BUN 25 (*)    Creatinine, Ser 1.35 (*)    GFR calc non Af Wyvonnia Lora  35 (*)    GFR calc Af Amer 41 (*)    All other components within normal limits  CBG MONITORING, ED - Abnormal; Notable for the following components:   Glucose-Capillary 68 (*)    All other components within normal limits  URINE CULTURE  URINALYSIS, ROUTINE W REFLEX MICROSCOPIC  CBG MONITORING, ED  CBG MONITORING, ED    EKG EKG Interpretation  Date/Time:  Sunday January 23 2019 12:58:07 EDT Ventricular Rate:  63 PR Interval:    QRS Duration: 99 QT Interval:  437 QTC Calculation: 444 R Axis:   1 Text Interpretation:  Sinus rhythm Atrial premature complex LVH with secondary repolarization abnormality Minimal ST elevation, inferior leads Confirmed by Gerlene Fee [phone]) on 01/23/2019 3:21:21 PM   Radiology Dg Chest 2 View  Result Date: 01/23/2019 CLINICAL DATA:  85 year old who fell in her bathroom at the nursing home and sustained a laceration to the back of the head. Current history of dementia. Initial encounter. EXAM: CHEST - 2 VIEW COMPARISON:  09/25/2018. FINDINGS: AP SEMI-ERECT and LATERAL images were obtained. Cardiac silhouette mildly to moderately enlarged, unchanged. Thoracic aorta atherosclerotic, unchanged. Hilar and mediastinal contours otherwise unremarkable. Mild atelectasis at the RIGHT lung base related to suboptimal inspiration. Lungs otherwise clear. Pulmonary vascularity normal. No visible pleural effusions. Degenerative changes involving thoracic and UPPER lumbar spine. IMPRESSION: 1.  Suboptimal inspiration accounts for mild RIGHT basilar atelectasis. No acute cardiopulmonary disease otherwise. 2. Stable cardiomegaly without pulmonary edema. 3. Thoracic aortic atherosclerosis. Electronically Signed   By: Evangeline Dakin M.D.   On: 01/23/2019 13:51   Ct Head Wo Contrast  Result Date: 01/23/2019 CLINICAL DATA:  Unwitnessed fall, found on floor, laceration to rear of head. Baseline dementia. EXAM: CT HEAD WITHOUT CONTRAST CT CERVICAL SPINE WITHOUT CONTRAST TECHNIQUE: Multidetector CT imaging of the head and cervical spine was performed following the standard protocol without intravenous contrast. Multiplanar CT image reconstructions of the cervical spine were also generated. COMPARISON:  Head CT and cervical spine CT dated 09/25/2018. FINDINGS: CT HEAD FINDINGS Brain: Generalized age related parenchymal volume loss with commensurate dilatation of the sulci. Chronic small vessel ischemic changes again noted throughout the bilateral periventricular and subcortical white matter regions. Ventricles are stable in size and configuration. There is no mass, hemorrhage, edema or other evidence of acute parenchymal abnormality. No extra-axial hemorrhage. Vascular: Chronic calcified atherosclerotic changes of the large vessels at the skull base. No unexpected hyperdense vessel. Skull: Normal. Negative for fracture or focal lesion. Sinuses/Orbits: No acute finding. Other: Scalp edema/laceration overlying the lower occipital bones/skull base. No underlying fracture. CT CERVICAL SPINE FINDINGS Alignment: Overall stable alignment. Stable dextroscoliosis. No evidence of acute vertebral body subluxation. Skull base and vertebrae: Stable chronic deformity of the C1 arch. No acute appearing fracture or displacement. Soft tissues and spinal canal: No prevertebral fluid or swelling. No visible canal hematoma. Disc levels: Degenerative spondylosis throughout the mid and lower cervical spine, moderate in degree with  associated disc space narrowings, osseous spurring and C4 anterolisthesis, stable. No more than mild central canal stenosis at any level. Upper chest: No acute findings. Other: Bilateral carotid atherosclerosis. IMPRESSION: 1. Scalp edema/laceration overlying the lower occipital bones/skull base. No underlying skull fracture. 2. No acute intracranial abnormality. No intracranial mass, hemorrhage or edema. Atrophy and chronic ischemic changes in the white matter. 3. No acute fracture or subluxation within the cervical spine. Chronic C1 arch deformity, as previously described. 4. Carotid atherosclerosis. Electronically Signed   By: Roxy Horseman.D.  On: 01/23/2019 14:02   Ct Cervical Spine Wo Contrast  Result Date: 01/23/2019 CLINICAL DATA:  Unwitnessed fall, found on floor, laceration to rear of head. Baseline dementia. EXAM: CT HEAD WITHOUT CONTRAST CT CERVICAL SPINE WITHOUT CONTRAST TECHNIQUE: Multidetector CT imaging of the head and cervical spine was performed following the standard protocol without intravenous contrast. Multiplanar CT image reconstructions of the cervical spine were also generated. COMPARISON:  Head CT and cervical spine CT dated 09/25/2018. FINDINGS: CT HEAD FINDINGS Brain: Generalized age related parenchymal volume loss with commensurate dilatation of the sulci. Chronic small vessel ischemic changes again noted throughout the bilateral periventricular and subcortical white matter regions. Ventricles are stable in size and configuration. There is no mass, hemorrhage, edema or other evidence of acute parenchymal abnormality. No extra-axial hemorrhage. Vascular: Chronic calcified atherosclerotic changes of the large vessels at the skull base. No unexpected hyperdense vessel. Skull: Normal. Negative for fracture or focal lesion. Sinuses/Orbits: No acute finding. Other: Scalp edema/laceration overlying the lower occipital bones/skull base. No underlying fracture. CT CERVICAL SPINE FINDINGS  Alignment: Overall stable alignment. Stable dextroscoliosis. No evidence of acute vertebral body subluxation. Skull base and vertebrae: Stable chronic deformity of the C1 arch. No acute appearing fracture or displacement. Soft tissues and spinal canal: No prevertebral fluid or swelling. No visible canal hematoma. Disc levels: Degenerative spondylosis throughout the mid and lower cervical spine, moderate in degree with associated disc space narrowings, osseous spurring and C4 anterolisthesis, stable. No more than mild central canal stenosis at any level. Upper chest: No acute findings. Other: Bilateral carotid atherosclerosis. IMPRESSION: 1. Scalp edema/laceration overlying the lower occipital bones/skull base. No underlying skull fracture. 2. No acute intracranial abnormality. No intracranial mass, hemorrhage or edema. Atrophy and chronic ischemic changes in the white matter. 3. No acute fracture or subluxation within the cervical spine. Chronic C1 arch deformity, as previously described. 4. Carotid atherosclerosis. Electronically Signed   By: Franki Cabot M.D.   On: 01/23/2019 14:02   Dg Hand Complete Right  Result Date: 01/23/2019 CLINICAL DATA:  85 year old who fell in her bathroom at the nursing home and sustained a laceration to the back of the head. Bruising over the DIP joint of the index finger of the RIGHT hand. Current history of dementia. Initial encounter. EXAM: RIGHT HAND - COMPLETE 3+ VIEW COMPARISON:  No prior RIGHT hand imaging. RIGHT wrist x-rays 08/29/2018 are correlated. FINDINGS: Severe osseous demineralization. No evidence of acute fracture. Severe narrowing of the IP joint spaces of all the fingers and thumb, with associated likely chronic subluxations of the PIP joints of the index, long and ring fingers and of the DIP joints of the long, ring and small fingers. Prior ORIF of a first metacarpal fracture with plate and screw fixation and normal healing. Remote distal radius fracture with  normal healing. IMPRESSION: 1. No acute osseous abnormality. 2. Severe osseous demineralization. 3. Severe osteoarthritis involving the IP joints of all the fingers and thumbs, with associated likely chronic subluxations of multiple IP joints of the index, long, ring and small fingers. Electronically Signed   By: Evangeline Dakin M.D.   On: 01/23/2019 13:55    Procedures Procedures (including critical care time)  Medications Ordered in ED Medications - No data to display   Initial Impression / Assessment and Plan / ED Course  I have reviewed the triage vital signs and the nursing notes.  Pertinent labs & imaging results that were available during my care of the patient were reviewed by me and considered  in my medical decision making (see chart for details).        Presenting for evaluation after fall.  Fall was unwitnessed, however immediately after the fall staff evaluated the patient, no obvious loss of consciousness.  Patient is not on blood thinners.  No obvious neurologic deficit, per staff patient is acting at baseline.  Patient does have a laceration on the back of her head, but no active bleeding.  Will order CT head, neck, chest and hand x-rays.  Basic labs and urine obtained.  CTs reassuring, no acute bleed or fracture.  X-rays viewed interpreted by me, no fracture or abnormality in the chest.  Right hand with significant chronic arthritis, but no acute fracture.  Labs reassuring, mild elevation creatinine at 1.3, although not far from baseline of 1.1.  UA negative.  EKG reassuring.  Case discussed with attending, Dr. Zenia Resides evaluated the patient.  I discussed results and plan with patient's nephew.  Per nephew, patient has been hypertensive for many months since her neck fracture last summer.  Per chart review, patient is frequently hypertensive in the ED.  No sign of endorgan damage at this time.  At this time, patient appears safe for discharge.  Return precautions given.  Nephew  states he understands and agrees plan.  Final Clinical Impressions(s) / ED Diagnoses   Final diagnoses:  Fall, initial encounter  Minor head injury, initial encounter  Laceration of other part of head without foreign body, initial encounter    ED Discharge Orders    None       Franchot Heidelberg, PA-C 01/23/19 1942    Lacretia Leigh, MD 01/24/19 1103

## 2019-01-23 NOTE — ED Notes (Signed)
Pt provided 4oz orange juice

## 2019-01-23 NOTE — ED Provider Notes (Signed)
Medical screening examination/treatment/procedure(s) were conducted as a shared visit with non-physician practitioner(s) and myself.  I personally evaluated the patient during the encounter.  None 85 year old female with history of dementia here after mechanical fall just prior to arrival.  Labs are reassuring and CT of head and cervical spine without acute injury.  Laceration on her occiput is not repairable.  Will discharge back to nursing home   Lacretia Leigh, MD 01/23/19 1451

## 2019-01-23 NOTE — Discharge Instructions (Addendum)
Continue taking home medications as prescribed. Keep wound clean, wash once a day with soap and water. Use ice to help with pain and swelling. Use Tylenol as needed for pain. Follow up with your primary care doctor as needed for further evaluation of your symptoms.  Return to the emergency room with any new, worsening, concerning symptoms.

## 2019-01-23 NOTE — ED Triage Notes (Signed)
EMS reports from [facility], while in bathroom staff heard unwitnessed fall, staff found Pt on floor with LAC to rear head, no other obvious injuries. Baseline dementia, poor historian.  BP 150/76 HR 64 RR 16 Sp02 97 RA

## 2019-01-24 LAB — URINE CULTURE: Culture: <10000 — AB

## 2019-02-19 ENCOUNTER — Inpatient Hospital Stay (HOSPITAL_COMMUNITY)
Admission: EM | Admit: 2019-02-19 | Discharge: 2019-02-21 | DRG: 071 | Disposition: A | Discharge status: Skilled Nursing Facility | Payer: Medicare Other | Attending: Internal Medicine | Admitting: Internal Medicine

## 2019-02-19 ENCOUNTER — Encounter (HOSPITAL_COMMUNITY): Payer: Self-pay | Admitting: Emergency Medicine

## 2019-02-19 ENCOUNTER — Emergency Department (HOSPITAL_COMMUNITY): Payer: Medicare Other

## 2019-02-19 DIAGNOSIS — N179 Acute kidney failure, unspecified: Secondary | ICD-10-CM | POA: Diagnosis present

## 2019-02-19 DIAGNOSIS — N390 Urinary tract infection, site not specified: Secondary | ICD-10-CM | POA: Diagnosis present

## 2019-02-19 DIAGNOSIS — I129 Hypertensive chronic kidney disease with stage 1 through stage 4 chronic kidney disease, or unspecified chronic kidney disease: Secondary | ICD-10-CM | POA: Diagnosis present

## 2019-02-19 DIAGNOSIS — Z1159 Encounter for screening for other viral diseases: Secondary | ICD-10-CM | POA: Diagnosis not present

## 2019-02-19 DIAGNOSIS — M6281 Muscle weakness (generalized): Secondary | ICD-10-CM | POA: Diagnosis not present

## 2019-02-19 DIAGNOSIS — G9341 Metabolic encephalopathy: Secondary | ICD-10-CM | POA: Diagnosis present

## 2019-02-19 DIAGNOSIS — Z79890 Hormone replacement therapy (postmenopausal): Secondary | ICD-10-CM

## 2019-02-19 DIAGNOSIS — G4700 Insomnia, unspecified: Secondary | ICD-10-CM | POA: Diagnosis present

## 2019-02-19 DIAGNOSIS — N3001 Acute cystitis with hematuria: Secondary | ICD-10-CM | POA: Diagnosis present

## 2019-02-19 DIAGNOSIS — N183 Chronic kidney disease, stage 3 (moderate): Secondary | ICD-10-CM | POA: Diagnosis present

## 2019-02-19 DIAGNOSIS — Z515 Encounter for palliative care: Secondary | ICD-10-CM | POA: Diagnosis present

## 2019-02-19 DIAGNOSIS — E039 Hypothyroidism, unspecified: Secondary | ICD-10-CM

## 2019-02-19 DIAGNOSIS — Z880 Allergy status to penicillin: Secondary | ICD-10-CM

## 2019-02-19 DIAGNOSIS — F0390 Unspecified dementia without behavioral disturbance: Secondary | ICD-10-CM

## 2019-02-19 DIAGNOSIS — Z79899 Other long term (current) drug therapy: Secondary | ICD-10-CM | POA: Diagnosis not present

## 2019-02-19 DIAGNOSIS — M810 Age-related osteoporosis without current pathological fracture: Secondary | ICD-10-CM | POA: Diagnosis present

## 2019-02-19 DIAGNOSIS — Z66 Do not resuscitate: Secondary | ICD-10-CM | POA: Diagnosis present

## 2019-02-19 DIAGNOSIS — Z888 Allergy status to other drugs, medicaments and biological substances status: Secondary | ICD-10-CM

## 2019-02-19 DIAGNOSIS — I10 Essential (primary) hypertension: Secondary | ICD-10-CM | POA: Diagnosis present

## 2019-02-19 LAB — PROTIME-INR
INR: 1 (ref 0.8–1.2)
Prothrombin Time: 13 seconds (ref 11.4–15.2)

## 2019-02-19 LAB — LACTIC ACID, PLASMA
Lactic Acid, Venous: 2 mmol/L (ref 0.5–1.9)
Lactic Acid, Venous: 2.6 mmol/L (ref 0.5–1.9)

## 2019-02-19 LAB — COMPREHENSIVE METABOLIC PANEL
ALT: 11 U/L (ref 0–44)
AST: 21 U/L (ref 15–41)
Albumin: 3.5 g/dL (ref 3.5–5.0)
Alkaline Phosphatase: 73 U/L (ref 38–126)
Anion gap: 6 (ref 5–15)
BUN: 22 mg/dL (ref 8–23)
CO2: 23 mmol/L (ref 22–32)
Calcium: 10.1 mg/dL (ref 8.9–10.3)
Chloride: 105 mmol/L (ref 98–111)
Creatinine, Ser: 1.66 mg/dL — ABNORMAL HIGH (ref 0.44–1.00)
GFR calc Af Amer: 32 mL/min — ABNORMAL LOW (ref >60)
GFR calc non Af Amer: 27 mL/min — ABNORMAL LOW (ref >60)
Glucose, Bld: 123 mg/dL — ABNORMAL HIGH (ref 70–99)
Potassium: 4.2 mmol/L (ref 3.5–5.1)
Sodium: 134 mmol/L — ABNORMAL LOW (ref 135–145)
Total Bilirubin: 0.7 mg/dL (ref 0.3–1.2)
Total Protein: 6.2 g/dL — ABNORMAL LOW (ref 6.5–8.1)

## 2019-02-19 LAB — URINALYSIS, ROUTINE W REFLEX MICROSCOPIC
Bacteria, UA: NONE SEEN
Bilirubin Urine: NEGATIVE
Glucose, UA: NEGATIVE mg/dL
Ketones, ur: NEGATIVE mg/dL
Nitrite: NEGATIVE
Protein, ur: 100 mg/dL — AB
Specific Gravity, Urine: 1.028 (ref 1.005–1.030)
WBC, UA: >50 WBC/hpf — ABNORMAL HIGH (ref 0–5)
pH: 5 (ref 5.0–8.0)

## 2019-02-19 LAB — POCT I-STAT EG7
Bicarbonate: 26.1 mmol/L (ref 20.0–28.0)
Calcium, Ion: 1.33 mmol/L (ref 1.15–1.40)
HCT: 36 % (ref 36.0–46.0)
Hemoglobin: 12.2 g/dL (ref 12.0–15.0)
O2 Saturation: 63 %
Potassium: 4.3 mmol/L (ref 3.5–5.1)
Sodium: 138 mmol/L (ref 135–145)
TCO2: 28 mmol/L (ref 22–32)
pCO2, Ven: 49.4 mmHg (ref 44.0–60.0)
pH, Ven: 7.331 (ref 7.250–7.430)
pO2, Ven: 36 mmHg (ref 32.0–45.0)

## 2019-02-19 LAB — CBC WITH DIFFERENTIAL/PLATELET
Abs Immature Granulocytes: 0.02 10*3/uL (ref 0.00–0.07)
Basophils Absolute: 0.1 10*3/uL (ref 0.0–0.1)
Basophils Relative: 1 %
Eosinophils Absolute: 0.2 10*3/uL (ref 0.0–0.5)
Eosinophils Relative: 3 %
HCT: 38.5 % (ref 36.0–46.0)
Hemoglobin: 12.2 g/dL (ref 12.0–15.0)
Immature Granulocytes: 0 %
Lymphocytes Relative: 21 %
Lymphs Abs: 1.6 10*3/uL (ref 0.7–4.0)
MCH: 32.1 pg (ref 26.0–34.0)
MCHC: 31.7 g/dL (ref 30.0–36.0)
MCV: 101.3 fL — ABNORMAL HIGH (ref 80.0–100.0)
Monocytes Absolute: 0.8 10*3/uL (ref 0.1–1.0)
Monocytes Relative: 11 %
Neutro Abs: 5.1 10*3/uL (ref 1.7–7.7)
Neutrophils Relative %: 64 %
Platelets: 270 10*3/uL (ref 150–400)
RBC: 3.8 MIL/uL — ABNORMAL LOW (ref 3.87–5.11)
RDW: 14.1 % (ref 11.5–15.5)
WBC: 7.8 10*3/uL (ref 4.0–10.5)
nRBC: 0 % (ref 0.0–0.2)

## 2019-02-19 LAB — MAGNESIUM: Magnesium: 2.6 mg/dL — ABNORMAL HIGH (ref 1.7–2.4)

## 2019-02-19 LAB — TROPONIN I: Troponin I: <0.03 ng/mL (ref <0.03)

## 2019-02-19 LAB — TSH: TSH: 96.452 u[IU]/mL — ABNORMAL HIGH (ref 0.350–4.500)

## 2019-02-19 LAB — BRAIN NATRIURETIC PEPTIDE: B Natriuretic Peptide: 124.7 pg/mL — ABNORMAL HIGH (ref 0.0–100.0)

## 2019-02-19 LAB — LIPASE, BLOOD: Lipase: 28 U/L (ref 11–51)

## 2019-02-19 LAB — PHOSPHORUS: Phosphorus: 3.4 mg/dL (ref 2.5–4.6)

## 2019-02-19 LAB — AMMONIA: Ammonia: 18 umol/L (ref 9–35)

## 2019-02-19 MED ORDER — HYDRALAZINE HCL 20 MG/ML IJ SOLN
INTRAMUSCULAR | Status: AC
  Filled 2019-02-19: qty 1

## 2019-02-19 MED ORDER — SODIUM CHLORIDE 0.9 % IV SOLN
1000.0000 mL | INTRAVENOUS | Status: DC
  Administered 2019-02-19: 1000 mL via INTRAVENOUS

## 2019-02-19 MED ORDER — HYDRALAZINE HCL 20 MG/ML IJ SOLN
10.0000 mg | Freq: Once | INTRAMUSCULAR | Status: AC
  Administered 2019-02-19: 10 mg via INTRAVENOUS

## 2019-02-19 MED ORDER — SODIUM CHLORIDE 0.9 % IV BOLUS (SEPSIS)
1000.0000 mL | Freq: Once | INTRAVENOUS | Status: AC
  Administered 2019-02-19: 23:00:00 1000 mL via INTRAVENOUS

## 2019-02-19 MED ORDER — SODIUM CHLORIDE 0.9 % IV SOLN
2.0000 g | Freq: Once | INTRAVENOUS | Status: AC
  Administered 2019-02-19: 2 g via INTRAVENOUS
  Filled 2019-02-19: qty 2

## 2019-02-19 NOTE — H&P (Signed)
History and Physical   Julia Carey IWO:[id number] DOB: Jun 13, 1928 DOA: 02/19/2019  Referring MD/NP/PA: Dr. Johnney Killian  PCP: Lynnell Catalan, [place]   Outpatient Specialists: None  Patient coming from: Skilled nursing facility  Chief Complaint: Altered mental status  HPI: Julia Carey is a 85 y.o. female with medical history significant of advanced dementia who is normally able to communicate with her caregiver, hypothyroidism, hypertension, dysphagia, major depressive disorder, osteoporosis who was brought in secondary to worsening mental status.  Patient again was able to communicate with caregivers a little despite dementia but today she was unable to do so.  She was also noted to be much weaker overall.  Patient has not been able to eat or drink normally that she does.  She was brought to the ER for evaluation.  In the ER work-up indicated evidence of UTI.  Patient is currently not responding to me not communicating.  She looks very chronically ill looking and advanced.  Patient is therefore being admitted for treatment of metabolic encephalopathy most likely secondary to UTI..  ED Course: Temperature 98.4, blood pressure 190/62, pulse 74, respiratory of 20 and oxygen sat 96% room air.  White count 7.8 hemoglobin 12.2 platelets 270.  BUN is 22 creatinine 1.66 calcium 10.1.  Lactic acid 2.6 and glucose 123.  Urinalysis showed moderate hemoglobin.  Leukocyte Estrace with WBC more than 50.  Some bacteria.  TSH is 96.45.  Patient has been admitted for further work-up.  Review of Systems: As per HPI otherwise 10 point review of systems negative.    Past Medical History:  Diagnosis Date  . Age-related osteoporosis without current pathological fracture   . Dementia ([place])   . Dementia ([place])   . Dysphagia   . Fracture of first cervical vertebra (HCC)   . Hypothyroidism   . Insomnia   . Major depression, single episode   . Muscle weakness (generalized)     History reviewed. No pertinent  surgical history.   has an unknown smoking status. She has never used smokeless tobacco. She reports previous alcohol use. She reports previous drug use.  Allergies  Allergen Reactions  . Amoxicillin Other (See Comments)    "Allergic," per MAR: Did it involve swelling of the face/tongue/throat, SOB, or low BP? Unk Did it involve sudden or severe rash/hives, skin peeling, or any reaction on the inside of your mouth or nose? Unk Did you need to seek medical attention at a hospital or doctor's office? Unk When did it last happen? Unk If all above answers are "NO", may proceed with cephalosporin use.   . Clindamycin/Lincomycin Other (See Comments)    "Allergic, " per MAR  . Keflex [Cephalexin] Other (See Comments)    "Allergic, " per [facility]    No family history on file.   Prior to Admission medications   Medication Sig Start Date End Date Taking? Authorizing Provider  acetaminophen (TYLENOL) 325 MG tablet Take 325 mg by mouth 3 (three) times daily.    Yes [provider]  Carboxymeth-Glycerin-Polysorb (REFRESH OPTIVE ADVANCED) 0.5-1-0.5 % SOLN Place 1 drop into both eyes 2 (two) times daily.    Yes [provider]  donepezil (ARICEPT) 10 MG tablet Take 10 mg by mouth at bedtime.    Yes [provider]  DULoxetine (CYMBALTA) 30 MG capsule Take 30 mg by mouth daily.   Yes [provider]  Eyelid Cleansers (OCUSOFT EYELID CLEANSING EX) Place 1 application into both eyes daily.    Yes [provider]  fluticasone (FLONASE) 50 MCG/ACT nasal spray Place 1 spray into both nostrils daily.   Yes [provider]  levothyroxine (SYNTHROID, LEVOTHROID) 50 MCG tablet Take 50 mcg by mouth daily.    Yes [provider]  losartan (COZAAR) 50 MG tablet Take 50 mg by mouth daily.   Yes [provider]  Melatonin 3 MG TABS Take 3 mg by mouth at bedtime.   Yes [provider]  methocarbamol (ROBAXIN) 500 MG tablet Take 500 mg by  mouth every 12 (twelve) hours as needed for muscle spasms (and/or pain).    Yes [provider]  metoprolol tartrate (LOPRESSOR) 25 MG tablet Take 25 mg by mouth every morning.    Yes [provider]  omeprazole (PRILOSEC) 20 MG capsule Take 20 mg by mouth daily as needed (acid reflux).    Yes [provider]  polyethylene glycol (MIRALAX / GLYCOLAX) packet Take 17 g by mouth daily. MIX INTO 8 OUNCES OF LIQUID   Yes [provider]  PROTEIN PO Take 1 Bottle by mouth 3 (three) times daily after meals.   Yes [provider]  vitamin B-12 (CYANOCOBALAMIN) 1000 MCG tablet Take 1,000 mcg by mouth every other day.    Yes [provider]  cetirizine (ZYRTEC) 5 MG tablet Take 5 mg by mouth daily.    [provider]    Physical Exam: Vitals:   02/19/19 2215 02/19/19 2230 02/19/19 2312 02/19/19 2339  BP: 121/61 121/60 136/82 (!) 190/62  Pulse: (!) 51 (!) 51 64 74  Resp: 18 12 18 18   Temp:      TempSrc:      SpO2: 97% 96% 99% 99%  Weight:      Height:          Constitutional: Chronically ill looking, obtunded, very cachectic Vitals:   02/19/19 2215 02/19/19 2230 02/19/19 2312 02/19/19 2339  BP: 121/61 121/60 136/82 (!) 190/62  Pulse: (!) 51 (!) 51 64 74  Resp: 18 12 18 18   Temp:      TempSrc:      SpO2: 97% 96% 99% 99%  Weight:      Height:       Eyes: PERRL, lids and conjunctivae normal, sunken eyes ENMT: Mucous membranes are moist. Posterior pharynx clear of any exudate or lesions.Normal dentition.  Neck: normal, supple, no masses, no thyromegaly Respiratory: clear to auscultation bilaterally, no wheezing, no crackles. Normal respiratory effort. No accessory muscle use.  Cardiovascular: Regular rate and rhythm, no murmurs / rubs / gallops. No extremity edema. 2+ pedal pulses. No carotid bruits.  Abdomen: Scaphoid, no tenderness, no masses palpated. No hepatosplenomegaly. Bowel sounds positive.  Musculoskeletal: no  clubbing / cyanosis. No joint deformity upper and lower extremities. Good ROM, no contractures. Normal muscle tone.  Skin: no rashes, lesions, ulcers. No induration Neurologic: CN 2-12 grossly intact. Sensation intact, DTR normal. Strength 5/5 in all 4.  Psychiatric: Obtunded, noncommunicating, no agitation   Labs on Admission: I have personally reviewed following labs and imaging studies  CBC: Recent Labs  Lab 02/19/19 2033 02/19/19 2133  WBC 7.8  --   NEUTROABS 5.1  --   HGB 12.2 12.2  HCT 38.5 36.0  MCV 101.3*  --   PLT 270  --    Basic Metabolic Panel: Recent Labs  Lab 02/19/19 2033 02/19/19 2133  NA 134* 138  K 4.2 4.3  CL 105  --   CO2 23  --   GLUCOSE 123*  --  BUN 22  --   CREATININE 1.66*  --   CALCIUM 10.1  --   MG 2.6*  --   PHOS 3.4  --    GFR: Estimated Creatinine Clearance: 21.8 mL/min (A) (by C-G formula based on SCr of 1.66 mg/dL (H)). Liver Function Tests: Recent Labs  Lab 02/19/19 2033  AST 21  ALT 11  ALKPHOS 73  BILITOT 0.7  PROT 6.2*  ALBUMIN 3.5   Recent Labs  Lab 02/19/19 2033  LIPASE 28   Recent Labs  Lab 02/19/19 2033  AMMONIA 18   Coagulation Profile: Recent Labs  Lab 02/19/19 2033  INR 1.0   Cardiac Enzymes: Recent Labs  Lab 02/19/19 2033  TROPONINI <0.03   BNP (last 3 results) No results for input(s): PROBNP in the last 8760 hours. HbA1C: No results for input(s): HGBA1C in the last 72 hours. CBG: No results for input(s): GLUCAP in the last 168 hours. Lipid Profile: No results for input(s): CHOL, HDL, LDLCALC, TRIG, CHOLHDL, LDLDIRECT in the last 72 hours. Thyroid Function Tests: Recent Labs    02/19/19 2033  TSH 96.452*   Anemia Panel: No results for input(s): VITAMINB12, FOLATE, FERRITIN, TIBC, IRON, RETICCTPCT in the last 72 hours. Urine analysis:    Component Value Date/Time   COLORURINE YELLOW 02/19/2019 2040   APPEARANCEUR HAZY (A) 02/19/2019 2040   LABSPEC 1.028 02/19/2019 2040   PHURINE  5.0 02/19/2019 2040   GLUCOSEU NEGATIVE 02/19/2019 2040   HGBUR MODERATE (A) 02/19/2019 2040   BILIRUBINUR NEGATIVE 02/19/2019 2040   [place] 02/19/2019 2040   PROTEINUR 100 (A) 02/19/2019 2040   NITRITE NEGATIVE 02/19/2019 2040   LEUKOCYTESUR SMALL (A) 02/19/2019 2040   Sepsis Labs: @LABRCNTIP (procalcitonin:4,lacticidven:4) )No results found for this or any previous visit (from the past 240 hour(s)).   Radiological Exams on Admission: Ct Head Wo Contrast  Result Date: 02/19/2019 CLINICAL DATA:  85 year old female with altered mental status. EXAM: CT HEAD WITHOUT CONTRAST TECHNIQUE: Contiguous axial images were obtained from the base of the skull through the vertex without intravenous contrast. COMPARISON:  01/23/2019 and prior exams FINDINGS: Brain: No evidence of acute infarction, hemorrhage, hydrocephalus, extra-axial collection or mass lesion/mass effect. Atrophy and chronic small-vessel white matter ischemic changes again noted. Vascular: Carotid atherosclerotic calcifications again noted. Skull: Normal. Negative for fracture or focal lesion. Sinuses/Orbits: No acute finding. A LEFT mastoid effusion is again noted. Other: None IMPRESSION: 1. No evidence of acute intracranial abnormality. 2. Atrophy and chronic small-vessel white matter ischemic changes. Electronically Signed   By: Margarette Canada M.D.   On: 02/19/2019 20:21   Dg Chest Port 1 View  Result Date: 02/19/2019 CLINICAL DATA:  Mental status changes EXAM: PORTABLE CHEST 1 VIEW COMPARISON:  01/23/2019 FINDINGS: Cardiac shadow is mildly enlarged but stable. Aortic calcifications are again seen. The lungs are clear. No acute bony abnormality is noted. IMPRESSION: No acute abnormality seen. Electronically Signed   By: Inez Catalina M.D.   On: 02/19/2019 20:26    EKG: Independently reviewed.  It shows sinus rhythm with a rate of 58, evidence of LVH and early repolarization  Assessment/Plan Principal Problem:   Acute metabolic  encephalopathy Active Problems:   Hypothyroidism   Dementia (HCC)   Accelerated hypertension   Muscle weakness (generalized)   Acute lower UTI     #1 acute metabolic encephalopathy: Most likely secondary to UTI.  Patient is worsening from her baseline of confusion.  Patient will be admitted.  Hydrated.  Treat underlying UTI.  Once  she is back to baseline she will be discharged back to skilled facility.  Patient also has markedly elevated TSH.  More than likely hypothyroidism may be contributing to the confusion.  Replete thyroid.  #2 hypothyroidism: TSH about 95.  We will get free T4 and T3.  Start levothyroxine and adjust dose accordingly.  This may be partly responsible for her dementia as well as confusion.  #3 UTI: Patient allergic to penicillin.  Treat with aztreonam.  Follow blood and urine culture and sensitivities  #4 dementia: Continue with home regimen.  #5 accelerated hypertension: Continue home regimen.  Adjust medication as needed.  #6 generalized weakness: Most likely related to overall medical problems including hypothyroidism.  PT and OT once she is stable.    DVT prophylaxis: Lovenox Code Status: DNR Family Communication: Legal guardian Ms. Snellings over the phone Disposition Plan: Back to skilled facility Consults called: None Admission status: Inpatient  Severity of Illness: The appropriate patient status for this patient is INPATIENT. Inpatient status is judged to be reasonable and necessary in order to provide the required intensity of service to ensure the patient's safety. The patient's presenting symptoms, physical exam findings, and initial radiographic and laboratory data in the context of their chronic comorbidities is felt to place them at high risk for further clinical deterioration. Furthermore, it is not anticipated that the patient will be medically stable for discharge from the hospital within 2 midnights of admission. The following factors support  the patient status of inpatient.   " The patient's presenting symptoms include worsening confusion. " The worrisome physical exam findings include confusion and noncommunicative. " The initial radiographic and laboratory data are worrisome because of evidence of UTI. " The chronic co-morbidities include dementia.   * I certify that at the point of admission it is my clinical judgment that the patient will require inpatient hospital care spanning beyond 2 midnights from the point of admission due to high intensity of service, high risk for further deterioration and high frequency of surveillance required.Barbette Merino MD [facility] Pager [phone]  If 7PM-7AM, please contact night-coverage [email or web address] Password [email or web address]  02/19/2019, 11:54 PM

## 2019-02-19 NOTE — ED Notes (Signed)
Nurse Navigator Note  Spoke with pt POA, pt nephew, Sabino Niemann, states pt has dementia, seemed more fatigued today. Notified him the pt is just getting to the ED and the EDP is in with pt at this time for assessment. We will update him as plan is available.

## 2019-02-19 NOTE — ED Notes (Signed)
Please call pt's nephew POA Celso Amy [phone] to give him an update. Patient goes [facility] and not [employer]

## 2019-02-19 NOTE — ED Provider Notes (Signed)
[facility] EMERGENCY DEPARTMENT Provider Note   CSN: [id number] Arrival date & time: 02/19/19  1932    History   Chief Complaint Chief Complaint  Patient presents with  . Lethargic    HPI Julia Carey is a 85 y.o. female.     HPI Patient is sent from nursing home for mental status change.  Baseline mental status was not communicated from the nursing home.  Patient's power of attorney did call and report that at baseline she is somewhat communicative with him and seems to recognize him but otherwise does not really communicate with other people.  Reportedly at the nursing home patient was put in her bed for a nap at about 3 PM and then at 5 PM seemed lethargic and not her normal self.  There is no report of injury or fall.  Patient has history of fall with subarachnoid hemorrhage previously.  Patient does not answer any questions.  She will slightly open her eyes to loud voice.  The patient's power of attorney reports that he saw her yesterday and she seemed slightly less active and interactive than baseline.  No additional historical information. Past Medical History:  Diagnosis Date  . Age-related osteoporosis without current pathological fracture   . Dementia ([place])   . Dementia ([place])   . Dysphagia   . Fracture of first cervical vertebra (HCC)   . Hypothyroidism   . Insomnia   . Major depression, single episode   . Muscle weakness (generalized)     Patient Active Problem List   Diagnosis Date Noted  . Hypothyroidism 09/25/2018  . Dementia ([place]) 09/25/2018  . Fall at home, initial encounter 09/25/2018  . Subarachnoid hemorrhage following injury with brief loss of consciousness but without open intracranial wound ([place]) 09/25/2018  . Accelerated hypertension 09/25/2018  . Muscle weakness (generalized) 09/25/2018  . Subarachnoid hemorrhage ([place]) 09/25/2018    History reviewed. No pertinent surgical history.   OB History   No obstetric history on file.       Home Medications    Prior to Admission medications   Medication Sig Start Date End Date Taking? Authorizing Provider  acetaminophen (TYLENOL) 325 MG tablet Take 325 mg by mouth 3 (three) times daily.    Yes [provider]  Carboxymeth-Glycerin-Polysorb (REFRESH OPTIVE ADVANCED) 0.5-1-0.5 % SOLN Place 1 drop into both eyes 2 (two) times daily.    Yes [provider]  donepezil (ARICEPT) 10 MG tablet Take 10 mg by mouth at bedtime.    Yes [provider]  DULoxetine (CYMBALTA) 30 MG capsule Take 30 mg by mouth daily.   Yes [provider]  Eyelid Cleansers (OCUSOFT EYELID CLEANSING EX) Place 1 application into both eyes daily.    Yes [provider]  fluticasone (FLONASE) 50 MCG/ACT nasal spray Place 1 spray into both nostrils daily.   Yes [provider]  levothyroxine (SYNTHROID, LEVOTHROID) 50 MCG tablet Take 50 mcg by mouth daily.    Yes [provider]  losartan (COZAAR) 50 MG tablet Take 50 mg by mouth daily.   Yes [provider]  Melatonin 3 MG TABS Take 3 mg by mouth at bedtime.   Yes [provider]  methocarbamol (ROBAXIN) 500 MG tablet Take 500 mg by mouth every 12 (twelve) hours as needed for muscle spasms (and/or pain).    Yes [provider]  metoprolol tartrate (LOPRESSOR) 25 MG tablet Take 25 mg by mouth every morning.    Yes [provider]  omeprazole (PRILOSEC) 20 MG capsule Take 20 mg by mouth daily as needed (acid reflux).    Yes [provider]  polyethylene glycol (MIRALAX / GLYCOLAX) packet Take 17 g by mouth daily. MIX INTO 8 OUNCES OF LIQUID   Yes [provider]  PROTEIN PO Take 1 Bottle by mouth 3 (three) times daily after meals.   Yes [provider]  vitamin B-12 (CYANOCOBALAMIN) 1000 MCG tablet Take 1,000 mcg by mouth every other day.    Yes [provider]  cetirizine (ZYRTEC) 5 MG tablet Take 5 mg by mouth daily.     [provider]    Family History No family history on file.  Social History Social History   Tobacco Use  . Smoking status: Unknown If Ever Smoked  . Smokeless tobacco: Never Used  Substance Use Topics  . Alcohol use: Not Currently  . Drug use: Not Currently     Allergies   Amoxicillin; Clindamycin/lincomycin; and Keflex [cephalexin]   Review of Systems Review of Systems Level 5 caveat cannot obtain review of systems due to dementia.  Physical Exam Updated Vital Signs BP 121/60   Pulse (!) 51   Temp 98.4 F (36.9 C) (Rectal)   Resp 12   Ht 5\' 6"  (1.676 m)   Wt 59 kg   SpO2 96%   BMI 20.98 kg/m   Physical Exam Constitutional:      Comments: Patient is resting quietly with eyes predominantly closed.  She has no respiratory distress.  Vital signs are normal.  To loud voice she will open her eyes and murmur a response that is not intelligible.  HENT:     Head: Normocephalic and atraumatic.     Mouth/Throat:     Comments: Mouth is slightly dry.  No pooling secretions or difficulty managing airway. Eyes:     Extraocular Movements: Extraocular movements intact.     Pupils: Pupils are equal, round, and reactive to light.  Neck:     Musculoskeletal: Neck supple.  Cardiovascular:     Rate and Rhythm: Normal rate and regular rhythm.  Pulmonary:     Effort: Pulmonary effort is normal.     Breath sounds: Normal breath sounds.  Abdominal:     General: There is no distension.     Palpations: Abdomen is soft.     Tenderness: There is no abdominal tenderness. There is no guarding.  Musculoskeletal:     Comments: Patient did not follow commands to do range of motion however lower extremities are in good condition.  There is no deformity or wounds.  No peripheral edema.  Skin is warm and dry.  Patient does not have excessive muscular atrophy.  Skin:    General: Skin is warm and dry.  Neurological:     Comments: Patient has very limited interaction.  To loud  voice and her name she will open her eyes.  He makes a unintelligible response.  She goes back to sleep and does not follow commands for grip strength or moving extremities to command.      ED Treatments / Results  Labs (all labs ordered are listed, but only abnormal results are displayed) Labs Reviewed  COMPREHENSIVE METABOLIC PANEL - Abnormal; Notable for the following components:      Result Value   Sodium 134 (*)    Glucose, Bld 123 (*)    Creatinine, Ser 1.66 (*)    Total Protein 6.2 (*)    GFR calc non Af Wyvonnia Lora  27 (*)    GFR calc Af Amer 32 (*)    All other components within normal limits  BRAIN NATRIURETIC PEPTIDE - Abnormal; Notable for the following components:   B Natriuretic Peptide 124.7 (*)    All other components within normal limits  LACTIC ACID, PLASMA - Abnormal; Notable for the following components:   Lactic Acid, Venous 2.0 (*)    All other components within normal limits  LACTIC ACID, PLASMA - Abnormal; Notable for the following components:   Lactic Acid, Venous 2.6 (*)    All other components within normal limits  CBC WITH DIFFERENTIAL/PLATELET - Abnormal; Notable for the following components:   RBC 3.80 (*)    MCV 101.3 (*)    All other components within normal limits  URINALYSIS, ROUTINE W REFLEX MICROSCOPIC - Abnormal; Notable for the following components:   APPearance HAZY (*)    Hgb urine dipstick MODERATE (*)    Protein, ur 100 (*)    Leukocytes,Ua SMALL (*)    WBC, UA >50 (*)    All other components within normal limits  MAGNESIUM - Abnormal; Notable for the following components:   Magnesium 2.6 (*)    All other components within normal limits  TSH - Abnormal; Notable for the following components:   TSH 96.452 (*)    All other components within normal limits  URINE CULTURE  CULTURE, BLOOD (ROUTINE X 2)  CULTURE, BLOOD (ROUTINE X 2)  SARS CORONAVIRUS 2 (HOSPITAL ORDER, [place] [facility])  LIPASE, BLOOD  TROPONIN I   PROTIME-INR  PHOSPHORUS  AMMONIA  BLOOD GAS, VENOUS  T3, FREE  T4  POCT I-STAT EG7    EKG EKG Interpretation  Date/Time:  Saturday Feb 19 2019 19:38:18 EDT Ventricular Rate:  58 PR Interval:    QRS Duration: 102 QT Interval:  454 QTC Calculation: 446 R Axis:   16 Text Interpretation:  Sinus rhythm Atrial premature complex LVH with secondary repolarization abnormality no sig change from first previous Confirmed by Charlesetta Shanks [phone]) on 02/19/2019 7:59:52 PM   Radiology Ct Head Wo Contrast  Result Date: 02/19/2019 CLINICAL DATA:  85 year old female with altered mental status. EXAM: CT HEAD WITHOUT CONTRAST TECHNIQUE: Contiguous axial images were obtained from the base of the skull through the vertex without intravenous contrast. COMPARISON:  01/23/2019 and prior exams FINDINGS: Brain: No evidence of acute infarction, hemorrhage, hydrocephalus, extra-axial collection or mass lesion/mass effect. Atrophy and chronic small-vessel white matter ischemic changes again noted. Vascular: Carotid atherosclerotic calcifications again noted. Skull: Normal. Negative for fracture or focal lesion. Sinuses/Orbits: No acute finding. A LEFT mastoid effusion is again noted. Other: None IMPRESSION: 1. No evidence of acute intracranial abnormality. 2. Atrophy and chronic small-vessel white matter ischemic changes. Electronically Signed   By: Margarette Canada M.D.   On: 02/19/2019 20:21   Dg Chest Port 1 View  Result Date: 02/19/2019 CLINICAL DATA:  Mental status changes EXAM: PORTABLE CHEST 1 VIEW COMPARISON:  01/23/2019 FINDINGS: Cardiac shadow is mildly enlarged but stable. Aortic calcifications are again seen. The lungs are clear. No acute bony abnormality is noted. IMPRESSION: No acute abnormality seen. Electronically Signed   By: Inez Catalina M.D.   On: 02/19/2019 20:26    Procedures Procedures (including critical care time) CRITICAL CARE Performed by: Charlesetta Shanks   Total critical care time: 30  minutes  Critical care time was exclusive of separately billable procedures and treating other patients.  Critical care was necessary to treat or prevent imminent  or life-threatening deterioration.  Critical care was time spent personally by me on the following activities: development of treatment plan with patient and/or surrogate as well as nursing, discussions with consultants, evaluation of patient's response to treatment, examination of patient, obtaining history from patient or surrogate, ordering and performing treatments and interventions, ordering and review of laboratory studies, ordering and review of radiographic studies, pulse oximetry and re-evaluation of patient's condition. Medications Ordered in ED Medications  sodium chloride 0.9 % bolus 1,000 mL (1,000 mLs Intravenous New Bag/Given 02/19/19 2250)    Followed by  0.9 %  sodium chloride infusion (1,000 mLs Intravenous New Bag/Given 02/19/19 2249)  aztreonam (AZACTAM) 2 g in sodium chloride 0.9 % 100 mL IVPB (has no administration in time range)     Initial Impression / Assessment and Plan / ED Course  I have reviewed the triage vital signs and the nursing notes.  Pertinent labs & imaging results that were available during my care of the patient were reviewed by me and considered in my medical decision making (see chart for details).       Patient presents with reported mental status change.  Indication is that patient has baseline dementia.  Patient does have findings consistent with significant urinary tract infection.  The status change may be due to infectious etiology.  Also, patient has significantly elevated TSH and need further evaluation for hypothyroidism.  Blood pressures are stable without hypotension.  At this time antibiotics and fluids initiated.  Final Clinical Impressions(s) / ED Diagnoses   Final diagnoses:  Acute cystitis with hematuria  Metabolic encephalopathy  Dementia without behavioral disturbance,  unspecified dementia type ([place])  Hypothyroidism, unspecified type    ED Discharge Orders    None       Charlesetta Shanks, MD 02/19/19 2309

## 2019-02-19 NOTE — ED Triage Notes (Signed)
Patient arrived with EMS from [facility] , staff reported lethargy/poor appetite this evening , CBG=252 by EMS , responds to voice at arrival but somnolent/letgargic . History of Dementia .

## 2019-02-19 NOTE — ED Notes (Signed)
Patient's nephew ( POA) updated on pt.'s condition and plan of care .

## 2019-02-19 NOTE — ED Notes (Signed)
Patient transported to CT SCAN . 

## 2019-02-19 NOTE — ED Notes (Signed)
Patient's nephew (POA) updated on pt.'s condition and admission plan .

## 2019-02-20 DIAGNOSIS — F0390 Unspecified dementia without behavioral disturbance: Secondary | ICD-10-CM

## 2019-02-20 DIAGNOSIS — E039 Hypothyroidism, unspecified: Secondary | ICD-10-CM

## 2019-02-20 DIAGNOSIS — N390 Urinary tract infection, site not specified: Secondary | ICD-10-CM

## 2019-02-20 LAB — COMPREHENSIVE METABOLIC PANEL
ALT: 13 U/L (ref 0–44)
AST: 19 U/L (ref 15–41)
Albumin: 3.4 g/dL — ABNORMAL LOW (ref 3.5–5.0)
Alkaline Phosphatase: 82 U/L (ref 38–126)
Anion gap: 12 (ref 5–15)
BUN: 19 mg/dL (ref 8–23)
CO2: 22 mmol/L (ref 22–32)
Calcium: 10.1 mg/dL (ref 8.9–10.3)
Chloride: 107 mmol/L (ref 98–111)
Creatinine, Ser: 1.26 mg/dL — ABNORMAL HIGH (ref 0.44–1.00)
GFR calc Af Amer: 44 mL/min — ABNORMAL LOW (ref >60)
GFR calc non Af Amer: 38 mL/min — ABNORMAL LOW (ref >60)
Glucose, Bld: 117 mg/dL — ABNORMAL HIGH (ref 70–99)
Potassium: 3.7 mmol/L (ref 3.5–5.1)
Sodium: 141 mmol/L (ref 135–145)
Total Bilirubin: 0.6 mg/dL (ref 0.3–1.2)
Total Protein: 6.1 g/dL — ABNORMAL LOW (ref 6.5–8.1)

## 2019-02-20 LAB — CBC
HCT: 37.4 % (ref 36.0–46.0)
Hemoglobin: 12.1 g/dL (ref 12.0–15.0)
MCH: 31.9 pg (ref 26.0–34.0)
MCHC: 32.4 g/dL (ref 30.0–36.0)
MCV: 98.7 fL (ref 80.0–100.0)
Platelets: 269 10*3/uL (ref 150–400)
RBC: 3.79 MIL/uL — ABNORMAL LOW (ref 3.87–5.11)
RDW: 14.4 % (ref 11.5–15.5)
WBC: 8.4 10*3/uL (ref 4.0–10.5)
nRBC: 0 % (ref 0.0–0.2)

## 2019-02-20 LAB — MRSA PCR SCREENING: MRSA by PCR: NEGATIVE

## 2019-02-20 LAB — SARS CORONAVIRUS 2 BY RT PCR (HOSPITAL ORDER, PERFORMED IN ~~LOC~~ HOSPITAL LAB): SARS Coronavirus 2: NEGATIVE

## 2019-02-20 MED ORDER — LEVOTHYROXINE SODIUM 50 MCG PO TABS
50.0000 ug | ORAL_TABLET | Freq: Every day | ORAL | Status: DC
  Administered 2019-02-20: 50 ug via ORAL
  Filled 2019-02-20: qty 1

## 2019-02-20 MED ORDER — CARBOXYMETH-GLYCERIN-POLYSORB 0.5-1-0.5 % OP SOLN: 1.0000 [drp] | Freq: Two times a day (BID) | OPHTHALMIC | Status: DC

## 2019-02-20 MED ORDER — LORATADINE 10 MG PO TABS
10.0000 mg | ORAL_TABLET | Freq: Every day | ORAL | Status: DC
  Administered 2019-02-20 – 2019-02-21 (×2): 10 mg via ORAL
  Filled 2019-02-20 (×2): qty 1

## 2019-02-20 MED ORDER — LEVOTHYROXINE SODIUM 100 MCG/5ML IV SOLN
50.0000 ug | Freq: Every day | INTRAVENOUS | Status: DC
  Administered 2019-02-21: 50 ug via INTRAVENOUS
  Filled 2019-02-20: qty 5

## 2019-02-20 MED ORDER — METHOCARBAMOL 500 MG PO TABS: 500.0000 mg | ORAL_TABLET | Freq: Two times a day (BID) | ORAL | Status: DC | PRN

## 2019-02-20 MED ORDER — OCUSOFT EYELID CLEANSING EX PADS: MEDICATED_PAD | Freq: Every day | CUTANEOUS | Status: DC

## 2019-02-20 MED ORDER — FLUTICASONE PROPIONATE 50 MCG/ACT NA SUSP
1.0000 | Freq: Every day | NASAL | Status: DC
  Administered 2019-02-20 – 2019-02-21 (×2): 1 via NASAL
  Filled 2019-02-20: qty 16

## 2019-02-20 MED ORDER — METOPROLOL TARTRATE 25 MG PO TABS
25.0000 mg | ORAL_TABLET | Freq: Every day | ORAL | Status: DC
  Administered 2019-02-20 – 2019-02-21 (×2): 25 mg via ORAL
  Filled 2019-02-20 (×2): qty 1

## 2019-02-20 MED ORDER — PANTOPRAZOLE SODIUM 40 MG PO TBEC
40.0000 mg | DELAYED_RELEASE_TABLET | Freq: Every day | ORAL | Status: DC
  Administered 2019-02-20 – 2019-02-21 (×2): 40 mg via ORAL
  Filled 2019-02-20 (×2): qty 1

## 2019-02-20 MED ORDER — ONDANSETRON HCL 4 MG PO TABS: 4.0000 mg | ORAL_TABLET | Freq: Four times a day (QID) | ORAL | Status: DC | PRN

## 2019-02-20 MED ORDER — MELATONIN 3 MG PO TABS
3.0000 mg | ORAL_TABLET | Freq: Every day | ORAL | Status: DC
  Administered 2019-02-20 (×2): 3 mg via ORAL
  Filled 2019-02-20 (×3): qty 1

## 2019-02-20 MED ORDER — DONEPEZIL HCL 10 MG PO TABS
10.0000 mg | ORAL_TABLET | Freq: Every day | ORAL | Status: DC
  Administered 2019-02-20 (×2): 10 mg via ORAL
  Filled 2019-02-20 (×3): qty 1

## 2019-02-20 MED ORDER — DEXTROSE-NACL 5-0.9 % IV SOLN
INTRAVENOUS | Status: DC
  Administered 2019-02-20: 02:00:00 via INTRAVENOUS

## 2019-02-20 MED ORDER — ENOXAPARIN SODIUM 40 MG/0.4ML ~~LOC~~ SOLN: 40.0000 mg | SUBCUTANEOUS | Status: DC

## 2019-02-20 MED ORDER — METOPROLOL TARTRATE 25 MG PO TABS
25.0000 mg | ORAL_TABLET | Freq: Once | ORAL | Status: AC
  Administered 2019-02-20: 17:00:00 25 mg via ORAL
  Filled 2019-02-20: qty 1

## 2019-02-20 MED ORDER — HYDRALAZINE HCL 20 MG/ML IJ SOLN
5.0000 mg | Freq: Four times a day (QID) | INTRAMUSCULAR | Status: DC | PRN
  Administered 2019-02-20 – 2019-02-21 (×2): 5 mg via INTRAVENOUS
  Filled 2019-02-20 (×2): qty 1

## 2019-02-20 MED ORDER — POLYVINYL ALCOHOL 1.4 % OP SOLN
1.0000 [drp] | Freq: Two times a day (BID) | OPHTHALMIC | Status: DC
  Administered 2019-02-20 – 2019-02-21 (×3): 1 [drp] via OPHTHALMIC
  Filled 2019-02-20: qty 15

## 2019-02-20 MED ORDER — POLYETHYLENE GLYCOL 3350 17 G PO PACK
17.0000 g | PACK | Freq: Every day | ORAL | Status: DC
  Administered 2019-02-21: 17 g via ORAL
  Filled 2019-02-20 (×2): qty 1

## 2019-02-20 MED ORDER — ONDANSETRON HCL 4 MG/2ML IJ SOLN: 4.0000 mg | Freq: Four times a day (QID) | INTRAMUSCULAR | Status: DC | PRN

## 2019-02-20 MED ORDER — VITAMIN B-12 1000 MCG PO TABS
1000.0000 ug | ORAL_TABLET | ORAL | Status: DC
  Administered 2019-02-20: 1000 ug via ORAL
  Filled 2019-02-20: qty 1

## 2019-02-20 MED ORDER — DEXTROSE 5 % IV SOLN
0.5000 g | Freq: Two times a day (BID) | INTRAVENOUS | Status: DC
  Administered 2019-02-20 (×2): 0.5 g via INTRAVENOUS
  Filled 2019-02-20 (×4): qty 0.5

## 2019-02-20 MED ORDER — DULOXETINE HCL 30 MG PO CPEP
30.0000 mg | ORAL_CAPSULE | Freq: Every day | ORAL | Status: DC
  Administered 2019-02-20 – 2019-02-21 (×2): 30 mg via ORAL
  Filled 2019-02-20 (×2): qty 1

## 2019-02-20 MED ORDER — ENOXAPARIN SODIUM 30 MG/0.3ML ~~LOC~~ SOLN
30.0000 mg | SUBCUTANEOUS | Status: DC
  Administered 2019-02-20: 30 mg via SUBCUTANEOUS
  Filled 2019-02-20: qty 0.3

## 2019-02-20 MED ORDER — LOSARTAN POTASSIUM 50 MG PO TABS
50.0000 mg | ORAL_TABLET | Freq: Every day | ORAL | Status: DC
  Administered 2019-02-20 – 2019-02-21 (×2): 50 mg via ORAL
  Filled 2019-02-20 (×2): qty 1

## 2019-02-20 MED ORDER — ACETAMINOPHEN 325 MG PO TABS
325.0000 mg | ORAL_TABLET | Freq: Three times a day (TID) | ORAL | Status: DC
  Administered 2019-02-20 – 2019-02-21 (×4): 325 mg via ORAL
  Filled 2019-02-20 (×4): qty 1

## 2019-02-20 MED ORDER — BENEPROTEIN PO POWD
6.0000 g | Freq: Three times a day (TID) | ORAL | Status: DC
  Administered 2019-02-20 – 2019-02-21 (×5): 6 g via ORAL
  Filled 2019-02-20: qty 227

## 2019-02-20 NOTE — Progress Notes (Signed)
Patient ID: Julia Carey, female   DOB: 15-Feb-1928, 85 y.o.   MRN: [id number]  PROGRESS NOTE    Julia Carey  Julia Carey Julia Carey Julia Carey PCP: Lynnell Catalan, Julia Carey   Brief Narrative:  85 year old female with history of advanced dementia, hypothyroidism, hypertension, dysphagia, major depressive disorder, osteoporosis presented with worsening mental status from skilled nursing facility.  Work-up in the ER indicated evidence of UTI.  She was started on IV antibiotics and fluids.  COVID-19 was negative  Assessment & Plan:   Principal Problem:   Acute metabolic encephalopathy Active Problems:   Hypothyroidism   Dementia (HCC)   Accelerated hypertension   Muscle weakness (generalized)   Acute lower UTI  Acute metabolic encephalopathy most likely secondary to UTI -Patient was obtunded on presentation. -More awake this morning and communicative but confused and hardly answers questions. -Monitor mental status.  Fall precautions. -PT evaluation.  SLP evaluation -Continue IV fluids for now as oral intake is still poor.  UTI: Present on admission -Currently on Azactam which should be continued.  History of penicillin allergy -Follow cultures  Hypothyroidism  -TSH extremely elevated above 95.  Follow free T3 and T4. -We will use intravenous levothyroxine for now and switch to oral once starts eating  Dementia/generalized weakness -Monitor mental status. -Palliative Carey evaluation for goals of Carey discussion.  If condition does not improve, consider hospice.  Continue donepezil, duloxetine   DVT prophylaxis: Lovenox Code Status: DNR Family Communication: None at bedside Disposition Plan: SNF in the next 1 to 2 days if clinically stable  Consultants: None  Procedures: None  Antimicrobials: Azactam from Carey onwards   Subjective: Patient seen and examined at bedside.  She is awake, hardly answers any questions.  As per nursing staff, she ate some of her  breakfast this morning.  No overnight fever or vomiting.  Objective: Vitals:   02/20/19 0100 02/20/19 0137 02/20/19 0405 02/20/19 0752  BP: (!) 117/48 (!) 142/55 (!) 148/59 (!) 157/54  Pulse: 70 73 68 72  Resp: 15 18 18 16   Temp:   97.6 F (36.4 C) (!) 96.8 F (36 C)  TempSrc:   Oral Axillary  SpO2: 92% 99% 96% 93%  Weight:      Height:        Intake/Output Summary (Last 24 hours) at 02/20/2019 1047 Last data filed at 02/20/2019 0801 Gross per 24 hour  Intake 915 ml  Output -  Net 915 ml   Filed Weights   02/19/19 1946  Weight: 59 kg    Examination:  General exam: Appears calm and comfortable.  Very thinly built elderly female lying in bed.  No acute distress.  Awake but confused and hardly answers any questions Respiratory system: Bilateral decreased breath sounds at bases, scattered crackles Cardiovascular system: S1 & S2 heard, Rate controlled Gastrointestinal system: Abdomen is nondistended, soft and nontender. Normal bowel sounds heard. Extremities: No cyanosis, clubbing, edema   Data Reviewed: I have personally reviewed following labs and imaging studies  CBC: Recent Labs  Lab 02/19/19 2033 02/19/19 2133 02/20/19 0502  WBC 7.8  --  8.4  NEUTROABS 5.1  --   --   HGB 12.2 12.2 12.1  HCT 38.5 36.0 37.4  MCV 101.3*  --  98.7  PLT 270  --  916   Basic Metabolic Panel: Recent Labs  Lab 02/19/19 2033 02/19/19 2133 02/20/19 0502  NA 134* 138 141  K 4.2 4.3 3.7  CL 105  --  107  CO2  23  --  22  GLUCOSE 123*  --  117*  BUN 22  --  19  CREATININE 1.66*  --  1.26*  CALCIUM 10.1  --  10.1  MG 2.6*  --   --   PHOS 3.4  --   --    GFR: Estimated Creatinine Clearance: 28.7 mL/min (A) (by C-G formula based on SCr of 1.26 mg/dL (H)). Liver Function Tests: Recent Labs  Lab 02/19/19 2033 02/20/19 0502  AST 21 19  ALT 11 13  ALKPHOS 73 82  BILITOT 0.7 0.6  PROT 6.2* 6.1*  ALBUMIN 3.5 3.4*   Recent Labs  Lab 02/19/19 2033  LIPASE 28   Recent Labs   Lab 02/19/19 2033  AMMONIA 18   Coagulation Profile: Recent Labs  Lab 02/19/19 2033  INR 1.0   Cardiac Enzymes: Recent Labs  Lab 02/19/19 2033  TROPONINI <0.03   BNP (last 3 results) No results for input(s): PROBNP in the last 8760 hours. HbA1C: No results for input(s): HGBA1C in the last 72 hours. CBG: No results for input(s): GLUCAP in the last 168 hours. Lipid Profile: No results for input(s): CHOL, HDL, LDLCALC, TRIG, CHOLHDL, LDLDIRECT in the last 72 hours. Thyroid Function Tests: Recent Labs    02/19/19 2033  TSH 96.452*   Anemia Panel: No results for input(s): VITAMINB12, FOLATE, FERRITIN, TIBC, IRON, RETICCTPCT in the last 72 hours. Sepsis Labs: Recent Labs  Lab 02/19/19 2033 02/19/19 2151  LATICACIDVEN 2.0* 2.6*    Recent Results (from the past 240 hour(s))  SARS Coronavirus 2 [facility] order, Performed in [place] hospital lab)     Status: None   Collection Time: 02/19/19  8:40 PM  Result Value Ref Range Status   SARS Coronavirus 2 NEGATIVE NEGATIVE Final    Comment: (NOTE) If result is NEGATIVE SARS-CoV-2 target nucleic acids are NOT DETECTED. The SARS-CoV-2 RNA is generally detectable in upper and lower  respiratory specimens during the acute phase of infection. The lowest  concentration of SARS-CoV-2 viral copies this assay can detect is 250  copies / mL. A negative result does not preclude SARS-CoV-2 infection  and should not be used as the sole basis for treatment or other  patient management decisions.  A negative result may occur with  improper specimen collection / handling, submission of specimen other  than nasopharyngeal swab, presence of viral mutation(s) within the  areas targeted by this assay, and inadequate number of viral copies  (<250 copies / mL). A negative result must be combined with clinical  observations, patient history, and epidemiological information. If result is POSITIVE SARS-CoV-2 target nucleic acids are  DETECTED. The SARS-CoV-2 RNA is generally detectable in upper and lower  respiratory specimens dur ing the acute phase of infection.  Positive  results are indicative of active infection with SARS-CoV-2.  Clinical  correlation with patient history and other diagnostic information is  necessary to determine patient infection status.  Positive results do  not rule out bacterial infection or co-infection with other viruses. If result is PRESUMPTIVE POSTIVE SARS-CoV-2 nucleic acids MAY BE PRESENT.   A presumptive positive result was obtained on the submitted specimen  and confirmed on repeat testing.  While 2019 novel coronavirus  (SARS-CoV-2) nucleic acids may be present in the submitted sample  additional confirmatory testing may be necessary for epidemiological  and / or clinical management purposes  to differentiate between  SARS-CoV-2 and other Sarbecovirus currently known to infect humans.  If clinically indicated additional testing  with an alternate test  methodology [phone]) is advised. The SARS-CoV-2 RNA is generally  detectable in upper and lower respiratory sp ecimens during the acute  phase of infection. The expected result is Negative. Fact Sheet for Patients:  StrictlyIdeas.no Fact Sheet for Healthcare Providers: BankingDealers.co.za This test is not yet approved or cleared by the [place] FDA and has been authorized for detection and/or diagnosis of SARS-CoV-2 by FDA under an Emergency Use Authorization (EUA).  This EUA will remain in effect (meaning this test can be used) for the duration of the COVID-19 declaration under Section 564(b)(1) of the Act, 21 U.S.C. section 360bbb-3(b)(1), unless the authorization is terminated or revoked sooner. Performed at [facility], [place] [address] [place], [place] [address]   MRSA PCR Screening     Status: None   Collection Time: 02/20/19  1:36 AM  Result Value Ref Range  Status   MRSA by PCR NEGATIVE NEGATIVE Final    Comment:        The GeneXpert MRSA Assay (FDA approved for NASAL specimens only), is one component of a comprehensive MRSA colonization surveillance program. It is not intended to diagnose MRSA infection nor to guide or monitor treatment for MRSA infections. Performed at [facility], [place] [address], [place], [place] [address]          Radiology Studies: Ct Head Wo Contrast  Result Date: Carey CLINICAL DATA:  85 year old female with altered mental status. EXAM: CT HEAD WITHOUT CONTRAST TECHNIQUE: Contiguous axial images were obtained from the base of the skull through the vertex without intravenous contrast. COMPARISON:  01/23/2019 and prior exams FINDINGS: Brain: No evidence of acute infarction, hemorrhage, hydrocephalus, extra-axial collection or mass lesion/mass effect. Atrophy and chronic small-vessel white matter ischemic changes again noted. Vascular: Carotid atherosclerotic calcifications again noted. Skull: Normal. Negative for fracture or focal lesion. Sinuses/Orbits: No acute finding. A LEFT mastoid effusion is again noted. Other: None IMPRESSION: 1. No evidence of acute intracranial abnormality. 2. Atrophy and chronic small-vessel white matter ischemic changes. Electronically Signed   By: Margarette Canada M.D.   On: 02/19/2019 20:21   Dg Chest Port 1 View  Result Date: Carey CLINICAL DATA:  Mental status changes EXAM: PORTABLE CHEST 1 VIEW COMPARISON:  01/23/2019 FINDINGS: Cardiac shadow is mildly enlarged but stable. Aortic calcifications are again seen. The lungs are clear. No acute bony abnormality is noted. IMPRESSION: No acute abnormality seen. Electronically Signed   By: Inez Catalina M.D.   On: 02/19/2019 20:26        Scheduled Meds: . acetaminophen  325 mg Oral TID  . donepezil  10 mg Oral QHS  . DULoxetine  30 mg Oral Daily  . enoxaparin (LOVENOX) injection  30 mg Subcutaneous Q24H  . fluticasone  1  spray Each Nare Daily  . levothyroxine  50 mcg Oral QAC breakfast  . levothyroxine  50 mcg Intravenous Daily  . loratadine  10 mg Oral Daily  . losartan  50 mg Oral Daily  . Melatonin  3 mg Oral QHS  . metoprolol tartrate  25 mg Oral Daily  . pantoprazole  40 mg Oral Daily  . polyethylene glycol  17 g Oral Daily  . polyvinyl alcohol  1 drop Both Eyes BID  . protein supplement  6 g Oral TID PC  . vitamin B-12  1,000 mcg Oral QODAY   Continuous Infusions: . sodium chloride 1,000 mL (02/19/19 2249)  . aztreonam 0.5 g (02/20/19 0951)  . dextrose  5 % and 0.9% NaCl 75 mL/hr at 02/20/19 0200     LOS: 1 day        Aline August, MD [facility] 02/20/2019, 10:47 AM

## 2019-02-20 NOTE — Progress Notes (Addendum)
CSW acknowledges SNF consult. Pending PT/OT recs. Patient from [facility].   [place], [place]

## 2019-02-20 NOTE — Evaluation (Signed)
Clinical/Bedside Swallow Evaluation Patient Details  Name: Julia Carey MRN: [id number] Date of Birth: March 26, 1928  Today's Date: 02/20/2019 Time: SLP Start Time (ACUTE ONLY): 90 SLP Stop Time (ACUTE ONLY): 1455 SLP Time Calculation (min) (ACUTE ONLY): 19 min  Past Medical History:  Past Medical History:  Diagnosis Date  . Age-related osteoporosis without current pathological fracture   . Dementia ([place])   . Dementia ([place])   . Dysphagia   . Fracture of first cervical vertebra (HCC)   . Hypothyroidism   . Insomnia   . Major depression, single episode   . Muscle weakness (generalized)    Past Surgical History: History reviewed. No pertinent surgical history. HPI:  Julia Carey is a 85 y.o. female with medical history significant of advanced dementia who is normally able to communicate with her caregiver, hypothyroidism, hypertension, dysphagia, major depressive disorder, osteoporosis who was brought in secondary to worsening mental status. Suspect due to UTI   Assessment / Plan / Recommendation Clinical Impression  A mild oropharyngeal dysphagia is suspected.  Pt alert, able to follow some simple commands. Prolonged mastication noted with solids as well as increased work of breathing during PO trials. Pt also with frequent belching. Vocal quality remained clear. Pt without overt s/sx of aspiration however increased risk remains in setting of advanced dementia. Recommend dysphagia 3 (mechanical soft) and thin liquids with medicines in puree and full supervision with all PO. SLP to follow up to ensure diet tolerance.   SLP Visit Diagnosis: Dysphagia, unspecified (R13.10)    Aspiration Risk  Mild aspiration risk;Moderate aspiration risk    Diet Recommendation   Dysphagia 3 (mechanical soft) thin liquids  Medication Administration: Whole meds with puree    Other  Recommendations Oral Care Recommendations: Oral care BID   Follow up Recommendations Skilled Nursing facility       Frequency and Duration min 1 x/week  1 week       Prognosis Prognosis for Safe Diet Advancement: Good Barriers to Reach Goals: Cognitive deficits      Swallow Study   General Date of Onset: 02/19/19 HPI: Julia Carey is a 85 y.o. female with medical history significant of advanced dementia who is normally able to communicate with her caregiver, hypothyroidism, hypertension, dysphagia, major depressive disorder, osteoporosis who was brought in secondary to worsening mental status. Suspect due to UTI Type of Study: Bedside Swallow Evaluation Previous Swallow Assessment: prior clinical swallow evaluation Diet Prior to this Study: Regular;Thin liquids Temperature Spikes Noted: No Respiratory Status: Room air History of Recent Intubation: No Behavior/Cognition: Alert;Requires cueing;Distractible Oral Cavity Assessment: Within Functional Limits Oral Care Completed by SLP: No Oral Cavity - Dentition: Adequate natural dentition Vision: Functional for self-feeding Self-Feeding Abilities: Needs assist Patient Positioning: Upright in bed Baseline Vocal Quality: Low vocal intensity Volitional Cough: Cognitively unable to elicit Volitional Swallow: Unable to elicit    Oral/Motor/Sensory Function Overall Oral Motor/Sensory Function: Generalized oral weakness   Ice Chips Ice chips: Impaired Presentation: Spoon Oral Phase Functional Implications: Prolonged oral transit Pharyngeal Phase Impairments: Suspected delayed Swallow;Multiple swallows   Thin Liquid Thin Liquid: Impaired Presentation: Cup;Straw Pharyngeal  Phase Impairments: Suspected delayed Swallow;Multiple swallows    Nectar Thick Nectar Thick Liquid: Not tested   Honey Thick Honey Thick Liquid: Not tested   Puree Puree: Within functional limits   Solid     Solid: Impaired Presentation: Self Fed Oral Phase Functional Implications: Prolonged oral transit Pharyngeal Phase Impairments: Suspected delayed Swallow;Multiple  swallows      Janal Haak  E Antoinette Haskett MA, CCC-SLP Acute Rehabilitation Services  02/20/2019,2:59 PM

## 2019-02-20 NOTE — Evaluation (Signed)
Physical Therapy Evaluation Patient Details Name: Julia Carey MRN: [id number] DOB: 1928-06-10 Today's Date: 02/20/2019   History of Present Illness  HPI: Julia Carey is a 85 y.o. female with medical history significant of advanced dementia who is normally able to communicate with her caregiver, hypothyroidism, hypertension, dysphagia, major depressive disorder, osteoporosis who was brought in secondary to worsening mental status. Suspect due to UTI.  Clinical Impression  Pt admitted with above. Pt was at [facility] PTA. Pt functioning at [place] with max directional verbal cues to complete task asked. Pt with noted advanced dementia. At this time pt safe to d/c back to [facility] if 24/7 assist can be provided due to impaired cognition and increased falls risk.    Follow Up Recommendations No PT follow up;Supervision/Assistance - 24 hour    Equipment Recommendations  None recommended by PT    Recommendations for Other Services       Precautions / Restrictions Precautions Precautions: Fall Precaution Comments: dementia Restrictions Weight Bearing Restrictions: No      Mobility  Bed Mobility Overal bed mobility: Needs Assistance Bed Mobility: Rolling;Sidelying to Sit Rolling: Min assist Sidelying to sit: Min assist       General bed mobility comments: max verbal and tactile cues to initiate but then pt completed on own  Transfers Overall transfer level: Needs assistance Equipment used: Rolling walker (2 wheeled) Transfers: Sit to/from Stand Sit to Stand: Min assist         General transfer comment: minA for safety, pt mildly impulsive, most likely due to habit and recognizing the walker, minA to steady pt as she pulled up on walker despite v/c's to push up from bed  Ambulation/Gait Ambulation/Gait assistance: Min assist Gait Distance (Feet): 100 Feet Assistive device: Rolling walker (2 wheeled) Gait Pattern/deviations: Step-through pattern;Decreased stride  length;Trunk flexed   Gait velocity interpretation: <1.31 ft/sec, indicative of household ambulator General Gait Details: pt with short, quick, shuffled steps, minA for walker management during turning and navigating hallway, pt in general unsteady but no episodes of LOB  Stairs            Wheelchair Mobility    Modified Rankin (Stroke Patients Only)       Balance Overall balance assessment: Needs assistance Sitting-balance support: Feet supported;Bilateral upper extremity supported Sitting balance-Leahy Scale: Fair     Standing balance support: Bilateral upper extremity supported Standing balance-Leahy Scale: Poor Standing balance comment: dependent on RW                             Pertinent Vitals/Pain Pain Assessment: No/denies pain    Home Living Family/patient expects to be discharged to:: Skilled nursing facility                 Additional Comments: resides in a memory care unit at [facility]    Prior Function Level of Independence: Needs assistance   Gait / Transfers Assistance Needed: pt poor historian however reports she uses RW and did amb well with RW today  ADL's / Homemaking Assistance Needed: assuming staff assist pts with ADLs due to dementia and dec safety awareness  Comments: per chart pt in [facility]     Hand Dominance   Dominant Hand: Right    Extremity/Trunk Assessment   Upper Extremity Assessment Upper Extremity Assessment: Generalized weakness    Lower Extremity Assessment Lower Extremity Assessment: Generalized weakness    Cervical / Trunk Assessment Cervical / Trunk Assessment:  Kyphotic  Communication   Communication: HOH  Cognition Arousal/Alertness: Lethargic(easily arrousable but closes eyes when you stop talking) Behavior During Therapy: Flat affect Overall Cognitive Status: History of cognitive impairments - at baseline                                 General Comments: advanced  dementia      General Comments General comments (skin integrity, edema, etc.): VSS    Exercises     Assessment/Plan    PT Assessment Patient needs continued PT services  PT Problem List Decreased strength;Decreased activity tolerance;Decreased balance;Decreased mobility;Decreased coordination;Decreased cognition       PT Treatment Interventions DME instruction;Gait training;Functional mobility training;Therapeutic activities;Therapeutic exercise;Balance training;Neuromuscular re-education;Cognitive remediation    PT Goals (Current goals can be found in the Care Plan section)  Acute Rehab PT Goals Patient Stated Goal: didn't state PT Goal Formulation: Patient unable to participate in goal setting Time For Goal Achievement: 03/06/19 Potential to Achieve Goals: Fair    Frequency Min 3X/week   Barriers to discharge        Co-evaluation               AM-PAC PT "6 Clicks" Mobility  Outcome Measure Help needed turning from your back to your side while in a flat bed without using bedrails?: A Lot Help needed moving from lying on your back to sitting on the side of a flat bed without using bedrails?: A Little Help needed moving to and from a bed to a chair (including a wheelchair)?: A Little Help needed standing up from a chair using your arms (e.g., wheelchair or bedside chair)?: A Little Help needed to walk in hospital room?: A Little Help needed climbing 3-5 steps with a railing? : A Lot 6 Click Score: 16    End of Session Equipment Utilized During Treatment: Gait belt Activity Tolerance: Patient tolerated treatment well Patient left: in chair;with call bell/phone within reach;with chair alarm set Nurse Communication: Mobility status(no purwic) PT Visit Diagnosis: Unsteadiness on feet (R26.81);Difficulty in walking, not elsewhere classified (R26.2)    Time: 9774-1423 PT Time Calculation (min) (ACUTE ONLY): 17 min   Charges:   PT Evaluation $PT Eval Moderate  Complexity: 1 Mod          Kittie Plater, PT, DPT Acute Rehabilitation Services Pager #: [phone] Office #: [phone]   Berline Lopes 02/20/2019, 1:14 PM

## 2019-02-20 NOTE — ED Notes (Signed)
ED TO INPATIENT HANDOFF REPORT  ED Nurse Name and Phone #:  Clydene Laming [address] Name/Age/Gender Julia Carey 85 y.o. female Room/Bed: RESUSC/RESUSC  Code Status   Code Status: DNR  Home/SNF/Other Nursing Home Is this baseline? No   Triage Complete: Triage complete  Chief Complaint AMS  Triage Note Patient arrived with EMS from [facility] home , staff reported lethargy/poor appetite this evening , CBG=252 by EMS , responds to voice at arrival but somnolent/letgargic . History of Dementia .    Allergies Allergies  Allergen Reactions  . Amoxicillin Hives    "Allergic," per MAR: Did it involve swelling of the face/tongue/throat, SOB, or low BP? Unk Did it involve sudden or severe rash/hives, skin peeling, or any reaction on the inside of your mouth or nose? Unk Did you need to seek medical attention at a hospital or doctor's office? Unk When did it last happen? Unk If all above answers are "NO", may proceed with cephalosporin use.   . Clindamycin/Lincomycin Hives    "Allergic, " per MAR  . Keflex [Cephalexin] Hives    "Allergic, " per MAR    Level of Care/Admitting Diagnosis ED Disposition    ED Disposition Condition [facility] Area: [place] [[id number]]  Level of Care: Med-Surg [16]  Covid Evaluation: N/A  Diagnosis: Acute metabolic encephalopathy [[id number]]  Admitting Physician: Elwyn Reach [[id number]]  Attending Physician: Elwyn Reach [[id number]]  Estimated length of stay: past midnight tomorrow  Certification:: I certify this patient will need inpatient services for at least 2 midnights  PT Class (Do Not Modify): Inpatient [101]  PT Acc Code (Do Not Modify): Private [1]       B Medical/Surgery History Past Medical History:  Diagnosis Date  . Age-related osteoporosis without current pathological fracture   . Dementia ([place])   . Dementia ([place])   . Dysphagia   . Fracture of first cervical vertebra (HCC)    . Hypothyroidism   . Insomnia   . Major depression, single episode   . Muscle weakness (generalized)    History reviewed. No pertinent surgical history.   A IV Location/Drains/Wounds Patient Lines/Drains/Airways Status   Active Line/Drains/Airways    Name:   Placement date:   Placement time:   Site:   Days:   Peripheral IV 02/19/19 Left Antecubital   02/19/19    -    Antecubital   1          Intake/Output Last 24 hours No intake or output data in the 24 hours ending 02/20/19 0058  Labs/Imaging Results for orders placed or performed during the hospital encounter of 02/19/19 (from the past 48 hour(s))  Comprehensive metabolic panel     Status: Abnormal   Collection Time: 02/19/19  8:33 PM  Result Value Ref Range   Sodium 134 (L) 135 - 145 mmol/L   Potassium 4.2 3.5 - 5.1 mmol/L   Chloride 105 98 - 111 mmol/L   CO2 23 22 - 32 mmol/L   Glucose, Bld 123 (H) 70 - 99 mg/dL   BUN 22 8 - 23 mg/dL   Creatinine, Ser 1.66 (H) 0.44 - 1.00 mg/dL   Calcium 10.1 8.9 - 10.3 mg/dL   Total Protein 6.2 (L) 6.5 - 8.1 g/dL   Albumin 3.5 3.5 - 5.0 g/dL   AST 21 15 - 41 U/L   ALT 11 0 - 44 U/L   Alkaline Phosphatase 73 38 - 126 U/L  Total Bilirubin 0.7 0.3 - 1.2 mg/dL   GFR calc non Af Amer 27 (L) >60 mL/min   GFR calc Af Amer 32 (L) >60 mL/min   Anion gap 6 5 - 15    Comment: Performed at [facility] [address] [place], [place] [address]  Lipase, blood     Status: None   Collection Time: 02/19/19  8:33 PM  Result Value Ref Range   Lipase 28 11 - 51 U/L    Comment: Performed at [facility], [place] [address] [place], [place] [address]  Brain natriuretic peptide     Status: Abnormal   Collection Time: 02/19/19  8:33 PM  Result Value Ref Range   B Natriuretic Peptide 124.7 (H) 0.0 - 100.0 pg/mL    Comment: Performed at [facility] [address], [place], [place] [address]  Troponin I - Once     Status: None   Collection Time: 02/19/19  8:33 PM  Result  Value Ref Range   Troponin I <0.03 <0.03 ng/mL    Comment: Performed at [facility] [address] [place], [place] [address]  Lactic acid, plasma     Status: Abnormal   Collection Time: 02/19/19  8:33 PM  Result Value Ref Range   Lactic Acid, Venous 2.0 (HH) 0.5 - 1.9 mmol/L    Comment: CRITICAL RESULT CALLED TO, READ BACK BY AND VERIFIED WITH: SANGELANG B,RN 02/19/19 2100 WAYK Performed at [facility], [place] [address] [place], [place] [address]   CBC with Differential     Status: Abnormal   Collection Time: 02/19/19  8:33 PM  Result Value Ref Range   WBC 7.8 4.0 - 10.5 K/uL   RBC 3.80 (L) 3.87 - 5.11 MIL/uL   Hemoglobin 12.2 12.0 - 15.0 g/dL   HCT 38.5 36.0 - 46.0 %   MCV 101.3 (H) 80.0 - 100.0 fL   MCH 32.1 26.0 - 34.0 pg   MCHC 31.7 30.0 - 36.0 g/dL   RDW 14.1 11.5 - 15.5 %   Platelets 270 150 - 400 K/uL   nRBC 0.0 0.0 - 0.2 %   Neutrophils Relative % 64 %   Neutro Abs 5.1 1.7 - 7.7 K/uL   Lymphocytes Relative 21 %   Lymphs Abs 1.6 0.7 - 4.0 K/uL   Monocytes Relative 11 %   Monocytes Absolute 0.8 0.1 - 1.0 K/uL   Eosinophils Relative 3 %   Eosinophils Absolute 0.2 0.0 - 0.5 K/uL   Basophils Relative 1 %   Basophils Absolute 0.1 0.0 - 0.1 K/uL   Immature Granulocytes 0 %   Abs Immature Granulocytes 0.02 0.00 - 0.07 K/uL    Comment: Performed at [facility] [address], [place], [place] [address]  Protime-INR     Status: None   Collection Time: 02/19/19  8:33 PM  Result Value Ref Range   Prothrombin Time 13.0 11.4 - 15.2 seconds   INR 1.0 0.8 - 1.2    Comment: (NOTE) INR goal varies based on device and disease states. Performed at [facility], [place] [address] [place], [place] [address]   Magnesium     Status: Abnormal   Collection Time: 02/19/19  8:33 PM  Result Value Ref Range   Magnesium 2.6 (H) 1.7 - 2.4 mg/dL    Comment: Performed at [facility] [address] [place], [place] [address]  Phosphorus     Status:  None    Collection Time: 02/19/19  8:33 PM  Result Value Ref Range   Phosphorus 3.4 2.5 - 4.6 mg/dL    Comment: Performed at [facility], [place] [address] [place], [place] [address]  TSH     Status: Abnormal   Collection Time: 02/19/19  8:33 PM  Result Value Ref Range   TSH 96.452 (H) 0.350 - 4.500 uIU/mL    Comment: Performed by a 3rd Generation assay with a functional sensitivity of <=0.01 uIU/mL. Performed at [facility], [place] [address], [place], [place] [address]   Ammonia     Status: None   Collection Time: 02/19/19  8:33 PM  Result Value Ref Range   Ammonia 18 9 - 35 umol/L    Comment: Performed at [facility], [place] [address] [place], [place] [address]  Urinalysis, Routine w reflex microscopic     Status: Abnormal   Collection Time: 02/19/19  8:40 PM  Result Value Ref Range   Color, Urine YELLOW YELLOW   APPearance HAZY (A) CLEAR   Specific Gravity, Urine 1.028 1.005 - 1.030   pH 5.0 5.0 - 8.0   Glucose, UA NEGATIVE NEGATIVE mg/dL   Hgb urine dipstick MODERATE (A) NEGATIVE   Bilirubin Urine NEGATIVE NEGATIVE   Ketones, ur NEGATIVE NEGATIVE mg/dL   Protein, ur 100 (A) NEGATIVE mg/dL   Nitrite NEGATIVE NEGATIVE   Leukocytes,Ua SMALL (A) NEGATIVE   RBC / HPF 21-50 0 - 5 RBC/hpf   WBC, UA >50 (H) 0 - 5 WBC/hpf   Bacteria, UA NONE SEEN NONE SEEN   Squamous Epithelial / LPF 0-5 0 - 5   Mucus PRESENT    Hyaline Casts, UA PRESENT     Comment: Performed at [facility], [place] [address] [place],  [address]  SARS Coronavirus 2 [facility] order, Performed in [facility] hospital lab)     Status: None   Collection Time: 02/19/19  8:40 PM  Result Value Ref Range   SARS Coronavirus 2 NEGATIVE NEGATIVE    Comment: (NOTE) If result is NEGATIVE SARS-CoV-2 target nucleic acids are NOT DETECTED. The SARS-CoV-2 RNA is generally detectable in upper and lower  respiratory specimens during the acute phase of infection. The lowest  concentration of SARS-CoV-2  viral copies this assay can detect is 250  copies / mL. A negative result does not preclude SARS-CoV-2 infection  and should not be used as the sole basis for treatment or other  patient management decisions.  A negative result may occur with  improper specimen collection / handling, submission of specimen other  than nasopharyngeal swab, presence of viral mutation(s) within the  areas targeted by this assay, and inadequate number of viral copies  (<250 copies / mL). A negative result must be combined with clinical  observations, patient history, and epidemiological information. If result is POSITIVE SARS-CoV-2 target nucleic acids are DETECTED. The SARS-CoV-2 RNA is generally detectable in upper and lower  respiratory specimens dur ing the acute phase of infection.  Positive  results are indicative of active infection with SARS-CoV-2.  Clinical  correlation with patient history and other diagnostic information is  necessary to determine patient infection status.  Positive results do  not rule out bacterial infection or co-infection with other viruses. If result is PRESUMPTIVE POSTIVE SARS-CoV-2 nucleic acids MAY BE PRESENT.   A presumptive positive result was obtained on the submitted specimen  and confirmed on repeat testing.  While 2019 novel coronavirus  (  SARS-CoV-2) nucleic acids may be present in the submitted sample  additional confirmatory testing may be necessary for epidemiological  and / or clinical management purposes  to differentiate between  SARS-CoV-2 and other Sarbecovirus currently known to infect humans.  If clinically indicated additional testing with an alternate test  methodology [id number]) is advised. The SARS-CoV-2 RNA is generally  detectable in upper and lower respiratory sp ecimens during the acute  phase of infection. The expected result is Negative. Fact Sheet for Patients:  StrictlyIdeas.no Fact Sheet for Healthcare  Providers: BankingDealers.co.za This test is not yet approved or cleared by the [place] FDA and has been authorized for detection and/or diagnosis of SARS-CoV-2 by FDA under an Emergency Use Authorization (EUA).  This EUA will remain in effect (meaning this test can be used) for the duration of the COVID-19 declaration under Section 564(b)(1) of the Act, 21 U.S.C. section 360bbb-3(b)(1), unless the authorization is terminated or revoked sooner. Performed at [facility], [place] [address], [place], [place] [address]   POCT I-Stat EG7     Status: None   Collection Time: 02/19/19  9:33 PM  Result Value Ref Range   pH, Ven 7.331 7.250 - 7.430   pCO2, Ven 49.4 44.0 - 60.0 mmHg   pO2, Ven 36.0 32.0 - 45.0 mmHg   Bicarbonate 26.1 20.0 - 28.0 mmol/L   TCO2 28 22 - 32 mmol/L   O2 Saturation 63.0 %   Sodium 138 135 - 145 mmol/L   Potassium 4.3 3.5 - 5.1 mmol/L   Calcium, Ion 1.33 1.15 - 1.40 mmol/L   HCT 36.0 36.0 - 46.0 %   Hemoglobin 12.2 12.0 - 15.0 g/dL   Patient temperature HIDE    Sample type VENOUS    Comment NOTIFIED PHYSICIAN   Lactic acid, plasma     Status: Abnormal   Collection Time: 02/19/19  9:51 PM  Result Value Ref Range   Lactic Acid, Venous 2.6 (HH) 0.5 - 1.9 mmol/L    Comment: CRITICAL RESULT CALLED TO, READ BACK BY AND VERIFIED WITH: SANGELANG B,RN 02/19/19 2234 WAYK Performed at [facility], [place] [address], [place],  [address]    Ct Head Wo Contrast  Result Date: 02/19/2019 CLINICAL DATA:  85 year old female with altered mental status. EXAM: CT HEAD WITHOUT CONTRAST TECHNIQUE: Contiguous axial images were obtained from the base of the skull through the vertex without intravenous contrast. COMPARISON:  01/23/2019 and prior exams FINDINGS: Brain: No evidence of acute infarction, hemorrhage, hydrocephalus, extra-axial collection or mass lesion/mass effect. Atrophy and chronic small-vessel white matter ischemic changes again  noted. Vascular: Carotid atherosclerotic calcifications again noted. Skull: Normal. Negative for fracture or focal lesion. Sinuses/Orbits: No acute finding. A LEFT mastoid effusion is again noted. Other: None IMPRESSION: 1. No evidence of acute intracranial abnormality. 2. Atrophy and chronic small-vessel white matter ischemic changes. Electronically Signed   By: Margarette Canada M.D.   On: 02/19/2019 20:21   Dg Chest Port 1 View  Result Date: 02/19/2019 CLINICAL DATA:  Mental status changes EXAM: PORTABLE CHEST 1 VIEW COMPARISON:  01/23/2019 FINDINGS: Cardiac shadow is mildly enlarged but stable. Aortic calcifications are again seen. The lungs are clear. No acute bony abnormality is noted. IMPRESSION: No acute abnormality seen. Electronically Signed   By: Inez Catalina M.D.   On: 02/19/2019 20:26    Pending Labs Unresulted Labs (From admission, onward)    Start     Ordered   02/26/19 0500  Creatinine, serum  (  enoxaparin (LOVENOX)    CrCl >/= 30 ml/min)  Weekly,   R    Comments:  while on enoxaparin therapy    02/20/19 0035   02/20/19 0500  Comprehensive metabolic panel  Tomorrow morning,   R     02/20/19 0035   02/20/19 0500  CBC  Tomorrow morning,   R     02/20/19 0035   02/20/19 0036  CBC  (enoxaparin (LOVENOX)    CrCl >/= 30 ml/min)  Once,   R    Comments:  Baseline for enoxaparin therapy IF NOT ALREADY DRAWN.  Notify MD if PLT < 100 K.    02/20/19 0035   02/20/19 0036  Creatinine, serum  (enoxaparin (LOVENOX)    CrCl >/= 30 ml/min)  Once,   R    Comments:  Baseline for enoxaparin therapy IF NOT ALREADY DRAWN.    02/20/19 0035   02/19/19 2242  T3, free  ONCE - STAT,   STAT     02/19/19 2241   02/19/19 2242  T4  ONCE - STAT,   STAT     02/19/19 2241   02/19/19 2241  Culture, blood (routine x 2)  BLOOD CULTURE X 2,   STAT     02/19/19 2240   02/19/19 1959  Blood gas, venous  Once,   STAT     02/19/19 1959   02/19/19 1952  Urine culture  ONCE - STAT,   STAT     02/19/19 1951           Vitals/Pain Today's Vitals   02/19/19 2339 02/19/19 2355 02/20/19 0013 02/20/19 0049  BP: (!) 190/62 (!) 141/55  (!) 117/45  Pulse: 74 64  69  Resp: 18 16  15   Temp:      TempSrc:      SpO2: 99% 99%  92%  Weight:      Height:      PainSc: Asleep Asleep Asleep     Isolation Precautions No active isolations  Medications Medications  sodium chloride 0.9 % bolus 1,000 mL (0 mLs Intravenous Stopped 02/19/19 2327)    Followed by  0.9 %  sodium chloride infusion (1,000 mLs Intravenous New Bag/Given 02/19/19 2249)  losartan (COZAAR) tablet 50 mg (has no administration in time range)  DULoxetine (CYMBALTA) DR capsule 30 mg (has no administration in time range)  fluticasone (FLONASE) 50 MCG/ACT nasal spray 1 spray (has no administration in time range)  levothyroxine (SYNTHROID) tablet 50 mcg (has no administration in time range)  Melatonin TABS 3 mg (has no administration in time range)  metoprolol tartrate (LOPRESSOR) tablet 25 mg (has no administration in time range)  polyethylene glycol (MIRALAX / GLYCOLAX) packet 17 g (has no administration in time range)  pantoprazole (PROTONIX) EC tablet 40 mg (has no administration in time range)  methocarbamol (ROBAXIN) tablet 500 mg (has no administration in time range)  acetaminophen (TYLENOL) tablet 325 mg (has no administration in time range)  vitamin B-12 (CYANOCOBALAMIN) tablet 1,000 mcg (has no administration in time range)  donepezil (ARICEPT) tablet 10 mg (has no administration in time range)  OcuSoft Eyelid Cleansing PADS (has no administration in time range)  Carboxymeth-Glycerin-Polysorb 0.5-1-0.5 % SOLN 1 drop (has no administration in time range)  loratadine (CLARITIN) tablet 10 mg (has no administration in time range)  Protein POWD (has no administration in time range)  enoxaparin (LOVENOX) injection 40 mg (has no administration in time range)  dextrose 5 %-0.9 % sodium chloride infusion (has no administration  in time range)   ondansetron (ZOFRAN) tablet 4 mg (has no administration in time range)    Or  ondansetron (ZOFRAN) injection 4 mg (has no administration in time range)  levothyroxine (SYNTHROID, LEVOTHROID) injection 50 mcg (has no administration in time range)  aztreonam (AZACTAM) 2 g in sodium chloride 0.9 % 100 mL IVPB (0 g Intravenous Stopped 02/19/19 2340)  hydrALAZINE (APRESOLINE) injection 10 mg (10 mg Intravenous Given 02/19/19 2343)    Mobility manual wheelchair High fall risk   Focused Assessments Family notified on pt.'s admission   R Recommendations: See Admitting Provider Note  Report given to:   Additional Notes:

## 2019-02-20 NOTE — Progress Notes (Signed)
Pharmacy Antibiotic Note  Julia Carey is a 85 y.o. female admitted on 02/19/2019 with AMS/UTI.  Pharmacy has been consulted for Aztreonam dosing.  Plan: Aztreonam 500 mg IV q12h  Height: 5\' 6"  (167.6 cm) Weight: 130 lb (59 kg) IBW/kg (Calculated) : 59.3  Temp (24hrs), Avg:98.4 F (36.9 C), Min:98.4 F (36.9 C), Max:98.4 F (36.9 C)  Recent Labs  Lab 02/19/19 2033 02/19/19 2151  WBC 7.8  --   CREATININE 1.66*  --   LATICACIDVEN 2.0* 2.6*    Estimated Creatinine Clearance: 21.8 mL/min (A) (by C-G formula based on SCr of 1.66 mg/dL (H)).    Allergies  Allergen Reactions  . Amoxicillin Hives    "Allergic," per MAR: Did it involve swelling of the face/tongue/throat, SOB, or low BP? Unk Did it involve sudden or severe rash/hives, skin peeling, or any reaction on the inside of your mouth or nose? Unk Did you need to seek medical attention at a hospital or doctor's office? Unk When did it last happen? Unk If all above answers are "NO", may proceed with cephalosporin use.   . Clindamycin/Lincomycin Hives    "Allergic, " per MAR  . Keflex [Cephalexin] Hives    "Allergic, " per [facility]    Madalaine Portier, [place] Curb 02/20/2019 1:55 AM

## 2019-02-21 DIAGNOSIS — M6281 Muscle weakness (generalized): Secondary | ICD-10-CM

## 2019-02-21 LAB — CBC WITH DIFFERENTIAL/PLATELET
Abs Immature Granulocytes: 0.01 10*3/uL (ref 0.00–0.07)
Basophils Absolute: 0.1 10*3/uL (ref 0.0–0.1)
Basophils Relative: 1 %
Eosinophils Absolute: 0.2 10*3/uL (ref 0.0–0.5)
Eosinophils Relative: 3 %
HCT: 38 % (ref 36.0–46.0)
Hemoglobin: 12.5 g/dL (ref 12.0–15.0)
Immature Granulocytes: 0 %
Lymphocytes Relative: 23 %
Lymphs Abs: 1.7 10*3/uL (ref 0.7–4.0)
MCH: 32 pg (ref 26.0–34.0)
MCHC: 32.9 g/dL (ref 30.0–36.0)
MCV: 97.2 fL (ref 80.0–100.0)
Monocytes Absolute: 0.9 10*3/uL (ref 0.1–1.0)
Monocytes Relative: 12 %
Neutro Abs: 4.5 10*3/uL (ref 1.7–7.7)
Neutrophils Relative %: 61 %
Platelets: 281 10*3/uL (ref 150–400)
RBC: 3.91 MIL/uL (ref 3.87–5.11)
RDW: 14.3 % (ref 11.5–15.5)
WBC: 7.4 10*3/uL (ref 4.0–10.5)
nRBC: 0 % (ref 0.0–0.2)

## 2019-02-21 LAB — BASIC METABOLIC PANEL
Anion gap: 14 (ref 5–15)
BUN: 15 mg/dL (ref 8–23)
CO2: 22 mmol/L (ref 22–32)
Calcium: 10.6 mg/dL — ABNORMAL HIGH (ref 8.9–10.3)
Chloride: 105 mmol/L (ref 98–111)
Creatinine, Ser: 1.23 mg/dL — ABNORMAL HIGH (ref 0.44–1.00)
GFR calc Af Amer: 45 mL/min — ABNORMAL LOW (ref >60)
GFR calc non Af Amer: 39 mL/min — ABNORMAL LOW (ref >60)
Glucose, Bld: 111 mg/dL — ABNORMAL HIGH (ref 70–99)
Potassium: 3.4 mmol/L — ABNORMAL LOW (ref 3.5–5.1)
Sodium: 141 mmol/L (ref 135–145)

## 2019-02-21 LAB — T3, FREE: T3, Free: 1.9 pg/mL — ABNORMAL LOW (ref 2.0–4.4)

## 2019-02-21 LAB — MAGNESIUM: Magnesium: 2.3 mg/dL (ref 1.7–2.4)

## 2019-02-21 LAB — T4: T4, Total: 5.4 ug/dL (ref 4.5–12.0)

## 2019-02-21 MED ORDER — DEXTROSE 5 % IV SOLN
0.5000 g | Freq: Three times a day (TID) | INTRAVENOUS | Status: DC
  Administered 2019-02-21: 0.5 g via INTRAVENOUS
  Filled 2019-02-21 (×3): qty 0.5

## 2019-02-21 MED ORDER — ENOXAPARIN SODIUM 30 MG/0.3ML ~~LOC~~ SOLN: 30.0000 mg | SUBCUTANEOUS | Status: DC

## 2019-02-21 MED ORDER — LEVOTHYROXINE SODIUM 100 MCG PO TABS: 100.0000 ug | ORAL_TABLET | Freq: Every day | ORAL | 0 refills | Status: DC

## 2019-02-21 MED ORDER — HALOPERIDOL LACTATE 5 MG/ML IJ SOLN
1.0000 mg | Freq: Four times a day (QID) | INTRAMUSCULAR | Status: DC | PRN
  Administered 2019-02-21: 1 mg via INTRAVENOUS
  Filled 2019-02-21: qty 1

## 2019-02-21 MED ORDER — LOSARTAN POTASSIUM 100 MG PO TABS: 100.0000 mg | ORAL_TABLET | Freq: Every day | ORAL | 0 refills | Status: AC

## 2019-02-21 MED ORDER — CIPROFLOXACIN HCL 500 MG PO TABS: 500.0000 mg | ORAL_TABLET | Freq: Two times a day (BID) | ORAL | 0 refills | Status: AC

## 2019-02-21 MED ORDER — QUETIAPINE FUMARATE 25 MG PO TABS
12.5000 mg | ORAL_TABLET | Freq: Every day | ORAL | Status: DC | PRN
  Administered 2019-02-21: 12.5 mg via ORAL
  Filled 2019-02-21: qty 1

## 2019-02-21 MED ORDER — QUETIAPINE FUMARATE 25 MG PO TABS: 12.5000 mg | ORAL_TABLET | Freq: Every day | ORAL | Status: DC

## 2019-02-21 MED ORDER — POTASSIUM CHLORIDE CRYS ER 20 MEQ PO TBCR
40.0000 meq | EXTENDED_RELEASE_TABLET | Freq: Once | ORAL | Status: AC
  Administered 2019-02-21: 40 meq via ORAL
  Filled 2019-02-21: qty 2

## 2019-02-21 NOTE — Evaluation (Signed)
Occupational Therapy Evaluation Patient Details Name: Julia Carey MRN: [id number] DOB: [birth year] Today's Date: 02/21/2019    History of Present Illness HPI: Julia Carey is a 85 y.o. female with medical history significant of advanced dementia who is normally able to communicate with her caregiver, hypothyroidism, hypertension, dysphagia, major depressive disorder, osteoporosis who was brought in secondary to worsening mental status. Suspect due to UTI.   Clinical Impression   This 85 y/o female presents with the above. PTA pt residing in memory care unit of ALF. Pt requiring minA for room level functional mobility today using RW. Performing toileting task with overall maxA, minA for seated grooming ADL. Pt following simple commands given multimodal cues; pt somewhat agitated at start of session requiring redirection and encouragement to participate in session and with functional tasks. Pt will benefit from continued acute OT services, and pending ALF is able to provide necessary supervision/assist feel pt is appropriate to return to current facility. Will follow.     Follow Up Recommendations  SNF;Supervision/Assistance - 24 hour(return to ALF if able to provide level of assist (24hr))    Equipment Recommendations  None recommended by OT           Precautions / Restrictions Precautions Precautions: Fall Precaution Comments: dementia Restrictions Weight Bearing Restrictions: No      Mobility Bed Mobility Overal bed mobility: Needs Assistance Bed Mobility: Supine to Sit;Sit to Supine     Supine to sit: Min guard Sit to supine: Min guard   General bed mobility comments: verbal and tactile cues to initiate but then pt completed on own  Transfers Overall transfer level: Needs assistance Equipment used: Rolling walker (2 wheeled) Transfers: Sit to/from Stand Sit to Stand: Min assist         General transfer comment: assist to rise and steady at [place]; pt stood from EOB  and toilet     Balance Overall balance assessment: Needs assistance Sitting-balance support: Feet supported;Bilateral upper extremity supported Sitting balance-Leahy Scale: Fair     Standing balance support: Bilateral upper extremity supported Standing balance-Leahy Scale: Poor Standing balance comment: dependent on RW                           ADL either performed or assessed with clinical judgement   ADL Overall ADL's : Needs assistance/impaired Eating/Feeding: Set up;Supervision/ safety;Sitting   Grooming: Minimal assistance;Sitting Grooming Details (indicate cue type and reason): assisted to apply hand sanitizer to hands as pt with difficulty understanding therapist directions Upper Body Bathing: Minimal assistance;Sitting   Lower Body Bathing: Moderate assistance;Sit to/from stand   Upper Body Dressing : Minimal assistance;Sitting Upper Body Dressing Details (indicate cue type and reason): donning second gown Lower Body Dressing: Moderate assistance;Maximal assistance;Sit to/from stand Lower Body Dressing Details (indicate cue type and reason): minA standing balance Toilet Transfer: Minimal assistance;Ambulation;RW;Regular Toilet;Grab bars   Toileting- Clothing Manipulation and Hygiene: Moderate assistance;Maximal assistance;Sit to/from stand Toileting - Clothing Manipulation Details (indicate cue type and reason): assist for gown management and peri-care after BM     Functional mobility during ADLs: Minimal assistance;Rolling walker       Vision         Perception     Praxis      Pertinent Vitals/Pain Pain Assessment: No/denies pain     Hand Dominance Right   Extremity/Trunk Assessment         Cervical / Trunk Assessment Cervical / Trunk Assessment: Kyphotic   Communication  Communication Communication: HOH   Cognition Arousal/Alertness: Awake/alert Behavior During Therapy: Agitated(easily agitated today (mild)) Overall Cognitive  Status: History of cognitive impairments - at baseline                                 General Comments: advanced dementia; follows simple commands given cues   General Comments       Exercises     Shoulder Instructions      Home Living Family/patient expects to be discharged to:: Skilled nursing facility                                 Additional Comments: resides in a memory [facility] unit at [facility]      Prior Functioning/Environment Level of Independence: Needs assistance  Gait / Transfers Assistance Needed: pt poor historian however reports she uses RW and did amb well with RW today ADL's / Homemaking Assistance Needed: assuming staff assist pts with ADLs due to dementia and dec safety awareness   Comments: per chart pt in [facility]        OT Problem List: Decreased strength;Decreased range of motion;Decreased activity tolerance;Impaired balance (sitting and/or standing);Decreased cognition;Decreased knowledge of use of DME or AE;Decreased safety awareness      OT Treatment/Interventions: Therapeutic exercise;Self-care/ADL training;Neuromuscular education;DME and/or AE instruction;Therapeutic activities;Patient/family education;Balance training;Cognitive remediation/compensation    OT Goals(Current goals can be found in the care plan section) Acute Rehab OT Goals Patient Stated Goal: wants to put her real clothes on OT Goal Formulation: With patient Time For Goal Achievement: 03/07/19 Potential to Achieve Goals: Good  OT Frequency: Min 2X/week   Barriers to D/C:            Co-evaluation              AM-PAC OT "6 Clicks" Daily Activity     Outcome Measure Help from another person eating meals?: A Little Help from another person taking care of personal grooming?: A Little Help from another person toileting, which includes using toliet, bedpan, or urinal?: A Lot Help from another person bathing (including washing, rinsing,  drying)?: A Lot Help from another person to put on and taking off regular upper body clothing?: A Little Help from another person to put on and taking off regular lower body clothing?: A Lot 6 Click Score: 15   End of Session Equipment Utilized During Treatment: Gait belt;Rolling walker Nurse Communication: Mobility status  Activity Tolerance: Patient tolerated treatment well Patient left: in bed;with call bell/phone within reach;with bed alarm set  OT Visit Diagnosis: Unsteadiness on feet (R26.81);Muscle weakness (generalized) (M62.81)                Time: 1610-9604 OT Time Calculation (min): 22 min Charges:  OT General Charges $OT Visit: 1 Visit OT Evaluation $OT Eval Moderate Complexity: 1 Mod  Lou Cal, OT [employer] Pager [phone] Office [phone]  Raymondo Band 02/21/2019, 2:20 PM

## 2019-02-21 NOTE — Progress Notes (Signed)
Consult received.  Epic information reviewed.  Patient being discharged back to [place].  Given dementia, dsyphagia, and recurrent hospitalizations - recommend Palliative to follow at ALF.  Florentina Jenny, PA-C Palliative Medicine Pager: [phone]

## 2019-02-21 NOTE — Progress Notes (Signed)
Hydralazine PRN administered. Bed alarm go off, pt trying to climb out of bed multiple times  and another IV and ace wrap around it was pulled out.

## 2019-02-21 NOTE — Discharge Summary (Signed)
Physician Discharge Summary  LAWREN SEXSON ZDG:[id number] DOB: 07-17-1928 DOA: 02/19/2019  PCP: Lynnell Catalan, FNP  Admit date: 02/19/2019 Discharge date: 02/21/2019  Admitted From: ALF Disposition: ALF  Recommendations for Outpatient Follow-up:  1. Follow up with PCP in 1 week with repeat CBC/BMP 2. Patient will benefit from outpatient evaluation and follow-up by palliative care team if condition worsens. 3. Follow up in ED if symptoms worsen or new appear   Home Health: No Equipment/Devices: None  Discharge Condition: Guarded CODE STATUS: DNR Diet recommendation: As per SLP  Brief/Interim Summary: 85 year old female with history of advanced dementia, hypothyroidism, hypertension, dysphagia, major depressive disorder, osteoporosis presented with worsening mental status from skilled nursing facility.  Work-up in the ER indicated evidence of UTI.  She was started on IV antibiotics and fluids.  COVID-19 was negative.  During the hospitalization, his condition has improved.  She is probably back to her baseline mental status.  Her TSH was elevated and she was started on IV levothyroxine.  She will be discharged on oral antibiotics and increased dose of oral levothyroxine back to her assisted living facility.  Overall prognosis is guarded to poor.  Consider palliative care evaluation in the facility if condition worsens.   Discharge Diagnoses:  Principal Problem:   Acute metabolic encephalopathy Active Problems:   Hypothyroidism   Dementia (HCC)   Accelerated hypertension   Muscle weakness (generalized)   Acute lower UTI  Acute metabolic encephalopathy most likely secondary to UTI -Patient was obtunded on presentation. -During the hospitalization, mental status improved.  Patient is probably back to her baseline mental status.  Overnight she was trying to get out of bed.  She is doing the same this morning.  Discharging her back to her previous environment would probably make her feel  better -Monitor mental status.  Fall precautions. -PT evaluation appreciated and recommend discharging back to ALF.  Diet as per SLP recommendations -If condition worsens at assisted living facility, consider comfort measures/hospice  UTI: Present on admission -Currently on Azactam. History of penicillin allergy -Urine culture is growing less than 40,000 staph hemolyticus: Unsure about the significance of this -We will discharge on ciprofloxacin for 3 more days   Hypothyroidism  -TSH extremely elevated above 95.   -Treated with IV levothyroxine -We will discharge on increased dose of oral levothyroxine.  She was on 50 mcg daily prior to admission and we will discharge on 100 mcg daily.  Outpatient follow-up  Dementia/generalized weakness -Palliative care evaluation for goals of care discussion is pending.  This can be done as an outpatient. Continue donepezil, duloxetine -If condition worsens, consider hospice/comfort measures.  Hypertension -Blood pressure on the higher side.  Increase losartan to 100 mg daily.  Continue metoprolol.   Discharge Instructions  Discharge Instructions    Diet - low sodium heart healthy   Complete by:  As directed    Discharge instructions   Complete by:  As directed    Fall precautions   Increase activity slowly   Complete by:  As directed      Allergies as of 02/21/2019      Reactions   Amoxicillin Hives   "Allergic," per MAR: Did it involve swelling of the face/tongue/throat, SOB, or low BP? Unk Did it involve sudden or severe rash/hives, skin peeling, or any reaction on the inside of your mouth or nose? Unk Did you need to seek medical attention at a hospital or doctor's office? Unk When did it last happen? Unk If all above answers  are "NO", may proceed with cephalosporin use.   Clindamycin/lincomycin Hives   "Allergic, " per MAR   Keflex [cephalexin] Hives   "Allergic, " per [facility]      Medication List    TAKE these medications    acetaminophen 325 MG tablet Commonly known as:  TYLENOL Take 325 mg by mouth 3 (three) times daily.   cetirizine 5 MG tablet Commonly known as:  ZYRTEC Take 5 mg by mouth daily.   ciprofloxacin 500 MG tablet Commonly known as:  Cipro Take 1 tablet (500 mg total) by mouth 2 (two) times daily for 3 days.   donepezil 10 MG tablet Commonly known as:  ARICEPT Take 10 mg by mouth at bedtime.   DULoxetine 30 MG capsule Commonly known as:  CYMBALTA Take 30 mg by mouth daily.   fluticasone 50 MCG/ACT nasal spray Commonly known as:  FLONASE Place 1 spray into both nostrils daily.   levothyroxine 100 MCG tablet Commonly known as:  Synthroid Take 1 tablet (100 mcg total) by mouth daily for 30 days. What changed:    medication strength  how much to take   losartan 100 MG tablet Commonly known as:  Cozaar Take 1 tablet (100 mg total) by mouth daily for 30 days. What changed:    medication strength  how much to take   Melatonin 3 MG Tabs Take 3 mg by mouth at bedtime.   methocarbamol 500 MG tablet Commonly known as:  ROBAXIN Take 500 mg by mouth every 12 (twelve) hours as needed for muscle spasms (and/or pain).   metoprolol tartrate 25 MG tablet Commonly known as:  LOPRESSOR Take 25 mg by mouth every morning.   OCUSOFT EYELID CLEANSING EX Place 1 application into both eyes daily.   omeprazole 20 MG capsule Commonly known as:  PRILOSEC Take 20 mg by mouth daily as needed (acid reflux).   polyethylene glycol 17 g packet Commonly known as:  MIRALAX / GLYCOLAX Take 17 g by mouth daily. MIX INTO 8 OUNCES OF LIQUID   PROTEIN PO Take 1 Bottle by mouth 3 (three) times daily after meals.   Refresh Optive Advanced 0.5-1-0.5 % Soln Generic drug:  Carboxymeth-Glycerin-Polysorb Place 1 drop into both eyes 2 (two) times daily.   vitamin B-12 1000 MCG tablet Commonly known as:  CYANOCOBALAMIN Take 1,000 mcg by mouth every other day.      Follow-up Information     Lynnell Catalan, FNP. Schedule an appointment as soon as possible for a visit in 1 week(s).   Specialty:  Family Medicine Contact information: [address] [place] [address] [phone]        Palliative care. Schedule an appointment as soon as possible for a visit in 1 week(s).          Allergies  Allergen Reactions  . Amoxicillin Hives    "Allergic," per MAR: Did it involve swelling of the face/tongue/throat, SOB, or low BP? Unk Did it involve sudden or severe rash/hives, skin peeling, or any reaction on the inside of your mouth or nose? Unk Did you need to seek medical attention at a hospital or doctor's office? Unk When did it last happen? Unk If all above answers are "NO", may proceed with cephalosporin use.   . Clindamycin/Lincomycin Hives    "Allergic, " per MAR  . Keflex [Cephalexin] Hives    "Allergic, " per [facility]    Consultations:  Palliative care evaluation pending   Procedures/Studies: Dg Chest 2 View  Result Date: 01/23/2019  CLINICAL DATA:  85 year old who fell in her bathroom at the nursing home and sustained a laceration to the back of the head. Current history of dementia. Initial encounter. EXAM: CHEST - 2 VIEW COMPARISON:  09/25/2018. FINDINGS: AP SEMI-ERECT and LATERAL images were obtained. Cardiac silhouette mildly to moderately enlarged, unchanged. Thoracic aorta atherosclerotic, unchanged. Hilar and mediastinal contours otherwise unremarkable. Mild atelectasis at the RIGHT lung base related to suboptimal inspiration. Lungs otherwise clear. Pulmonary vascularity normal. No visible pleural effusions. Degenerative changes involving thoracic and UPPER lumbar spine. IMPRESSION: 1. Suboptimal inspiration accounts for mild RIGHT basilar atelectasis. No acute cardiopulmonary disease otherwise. 2. Stable cardiomegaly without pulmonary edema. 3. Thoracic aortic atherosclerosis. Electronically Signed   By: Evangeline Dakin M.D.   On: 01/23/2019 13:51   Ct  Head Wo Contrast  Result Date: 02/19/2019 CLINICAL DATA:  85 year old female with altered mental status. EXAM: CT HEAD WITHOUT CONTRAST TECHNIQUE: Contiguous axial images were obtained from the base of the skull through the vertex without intravenous contrast. COMPARISON:  01/23/2019 and prior exams FINDINGS: Brain: No evidence of acute infarction, hemorrhage, hydrocephalus, extra-axial collection or mass lesion/mass effect. Atrophy and chronic small-vessel white matter ischemic changes again noted. Vascular: Carotid atherosclerotic calcifications again noted. Skull: Normal. Negative for fracture or focal lesion. Sinuses/Orbits: No acute finding. A LEFT mastoid effusion is again noted. Other: None IMPRESSION: 1. No evidence of acute intracranial abnormality. 2. Atrophy and chronic small-vessel white matter ischemic changes. Electronically Signed   By: Margarette Canada M.D.   On: 02/19/2019 20:21   Ct Head Wo Contrast  Result Date: 01/23/2019 CLINICAL DATA:  Unwitnessed fall, found on floor, laceration to rear of head. Baseline dementia. EXAM: CT HEAD WITHOUT CONTRAST CT CERVICAL SPINE WITHOUT CONTRAST TECHNIQUE: Multidetector CT imaging of the head and cervical spine was performed following the standard protocol without intravenous contrast. Multiplanar CT image reconstructions of the cervical spine were also generated. COMPARISON:  Head CT and cervical spine CT dated 09/25/2018. FINDINGS: CT HEAD FINDINGS Brain: Generalized age related parenchymal volume loss with commensurate dilatation of the sulci. Chronic small vessel ischemic changes again noted throughout the bilateral periventricular and subcortical white matter regions. Ventricles are stable in size and configuration. There is no mass, hemorrhage, edema or other evidence of acute parenchymal abnormality. No extra-axial hemorrhage. Vascular: Chronic calcified atherosclerotic changes of the large vessels at the skull base. No unexpected hyperdense vessel.  Skull: Normal. Negative for fracture or focal lesion. Sinuses/Orbits: No acute finding. Other: Scalp edema/laceration overlying the lower occipital bones/skull base. No underlying fracture. CT CERVICAL SPINE FINDINGS Alignment: Overall stable alignment. Stable dextroscoliosis. No evidence of acute vertebral body subluxation. Skull base and vertebrae: Stable chronic deformity of the C1 arch. No acute appearing fracture or displacement. Soft tissues and spinal canal: No prevertebral fluid or swelling. No visible canal hematoma. Disc levels: Degenerative spondylosis throughout the mid and lower cervical spine, moderate in degree with associated disc space narrowings, osseous spurring and C4 anterolisthesis, stable. No more than mild central canal stenosis at any level. Upper chest: No acute findings. Other: Bilateral carotid atherosclerosis. IMPRESSION: 1. Scalp edema/laceration overlying the lower occipital bones/skull base. No underlying skull fracture. 2. No acute intracranial abnormality. No intracranial mass, hemorrhage or edema. Atrophy and chronic ischemic changes in the white matter. 3. No acute fracture or subluxation within the cervical spine. Chronic C1 arch deformity, as previously described. 4. Carotid atherosclerosis. Electronically Signed   By: Franki Cabot M.D.   On: 01/23/2019 14:02   Ct Cervical Spine  Wo Contrast  Result Date: 01/23/2019 CLINICAL DATA:  Unwitnessed fall, found on floor, laceration to rear of head. Baseline dementia. EXAM: CT HEAD WITHOUT CONTRAST CT CERVICAL SPINE WITHOUT CONTRAST TECHNIQUE: Multidetector CT imaging of the head and cervical spine was performed following the standard protocol without intravenous contrast. Multiplanar CT image reconstructions of the cervical spine were also generated. COMPARISON:  Head CT and cervical spine CT dated 09/25/2018. FINDINGS: CT HEAD FINDINGS Brain: Generalized age related parenchymal volume loss with commensurate dilatation of the  sulci. Chronic small vessel ischemic changes again noted throughout the bilateral periventricular and subcortical white matter regions. Ventricles are stable in size and configuration. There is no mass, hemorrhage, edema or other evidence of acute parenchymal abnormality. No extra-axial hemorrhage. Vascular: Chronic calcified atherosclerotic changes of the large vessels at the skull base. No unexpected hyperdense vessel. Skull: Normal. Negative for fracture or focal lesion. Sinuses/Orbits: No acute finding. Other: Scalp edema/laceration overlying the lower occipital bones/skull base. No underlying fracture. CT CERVICAL SPINE FINDINGS Alignment: Overall stable alignment. Stable dextroscoliosis. No evidence of acute vertebral body subluxation. Skull base and vertebrae: Stable chronic deformity of the C1 arch. No acute appearing fracture or displacement. Soft tissues and spinal canal: No prevertebral fluid or swelling. No visible canal hematoma. Disc levels: Degenerative spondylosis throughout the mid and lower cervical spine, moderate in degree with associated disc space narrowings, osseous spurring and C4 anterolisthesis, stable. No more than mild central canal stenosis at any level. Upper chest: No acute findings. Other: Bilateral carotid atherosclerosis. IMPRESSION: 1. Scalp edema/laceration overlying the lower occipital bones/skull base. No underlying skull fracture. 2. No acute intracranial abnormality. No intracranial mass, hemorrhage or edema. Atrophy and chronic ischemic changes in the white matter. 3. No acute fracture or subluxation within the cervical spine. Chronic C1 arch deformity, as previously described. 4. Carotid atherosclerosis. Electronically Signed   By: Franki Cabot M.D.   On: 01/23/2019 14:02   Dg Chest Port 1 View  Result Date: 02/19/2019 CLINICAL DATA:  Mental status changes EXAM: PORTABLE CHEST 1 VIEW COMPARISON:  01/23/2019 FINDINGS: Cardiac shadow is mildly enlarged but stable. Aortic  calcifications are again seen. The lungs are clear. No acute bony abnormality is noted. IMPRESSION: No acute abnormality seen. Electronically Signed   By: Inez Catalina M.D.   On: 02/19/2019 20:26   Dg Hand Complete Right  Result Date: 01/23/2019 CLINICAL DATA:  85 year old who fell in her bathroom at the nursing home and sustained a laceration to the back of the head. Bruising over the DIP joint of the index finger of the RIGHT hand. Current history of dementia. Initial encounter. EXAM: RIGHT HAND - COMPLETE 3+ VIEW COMPARISON:  No prior RIGHT hand imaging. RIGHT wrist x-rays 08/29/2018 are correlated. FINDINGS: Severe osseous demineralization. No evidence of acute fracture. Severe narrowing of the IP joint spaces of all the fingers and thumb, with associated likely chronic subluxations of the PIP joints of the index, long and ring fingers and of the DIP joints of the long, ring and small fingers. Prior ORIF of a first metacarpal fracture with plate and screw fixation and normal healing. Remote distal radius fracture with normal healing. IMPRESSION: 1. No acute osseous abnormality. 2. Severe osseous demineralization. 3. Severe osteoarthritis involving the IP joints of all the fingers and thumbs, with associated likely chronic subluxations of multiple IP joints of the index, long, ring and small fingers. Electronically Signed   By: Evangeline Dakin M.D.   On: 01/23/2019 13:55  Subjective: Patient seen and examined at bedside.  She is awake but trying to climb out of bed.  As per nursing staff, she was restless overnight.  No overnight fever or vomiting.  Discharge Exam: Vitals:   02/21/19 0305 02/21/19 0818  BP: (!) 183/71 (!) 179/70  Pulse: 66 65  Resp: 18 13  Temp: (!) 97.5 F (36.4 C) (!) 97.5 F (36.4 C)  SpO2: 97% 97%    General: Awake but confused.  Trying to get out of bed.  Elderly female.  Very thinly built. Cardiovascular: rate controlled, S1/S2 + Respiratory: bilateral  decreased breath sounds at bases Abdominal: Soft, NT, ND, bowel sounds + Extremities: no edema, no cyanosis    The results of significant diagnostics from this hospitalization (including imaging, microbiology, ancillary and laboratory) are listed below for reference.     Microbiology: Recent Results (from the past 240 hour(s))  Urine culture     Status: Abnormal (Preliminary result)   Collection Time: 02/19/19  8:40 PM  Result Value Ref Range Status   Specimen Description URINE, CATHETERIZED  Final   Special Requests NONE  Final   Culture (A)  Final    40,000 COLONIES/mL STAPHYLOCOCCUS HAEMOLYTICUS SUSCEPTIBILITIES TO FOLLOW Performed at [facility], [address] [place], [place] [address]    Report Status PENDING  Incomplete  SARS Coronavirus 2 [facility] order, Performed in [place] hospital lab)     Status: None   Collection Time: 02/19/19  8:40 PM  Result Value Ref Range Status   SARS Coronavirus 2 NEGATIVE NEGATIVE Final    Comment: (NOTE) If result is NEGATIVE SARS-CoV-2 target nucleic acids are NOT DETECTED. The SARS-CoV-2 RNA is generally detectable in upper and lower  respiratory specimens during the acute phase of infection. The lowest  concentration of SARS-CoV-2 viral copies this assay can detect is 250  copies / mL. A negative result does not preclude SARS-CoV-2 infection  and should not be used as the sole basis for treatment or other  patient management decisions.  A negative result may occur with  improper specimen collection / handling, submission of specimen other  than nasopharyngeal swab, presence of viral mutation(s) within the  areas targeted by this assay, and inadequate number of viral copies  (<250 copies / mL). A negative result must be combined with clinical  observations, patient history, and epidemiological information. If result is POSITIVE SARS-CoV-2 target nucleic acids are DETECTED. The SARS-CoV-2 RNA is generally detectable in  upper and lower  respiratory specimens dur ing the acute phase of infection.  Positive  results are indicative of active infection with SARS-CoV-2.  Clinical  correlation with patient history and other diagnostic information is  necessary to determine patient infection status.  Positive results do  not rule out bacterial infection or co-infection with other viruses. If result is PRESUMPTIVE POSTIVE SARS-CoV-2 nucleic acids MAY BE PRESENT.   A presumptive positive result was obtained on the submitted specimen  and confirmed on repeat testing.  While 2019 novel coronavirus  (SARS-CoV-2) nucleic acids may be present in the submitted sample  additional confirmatory testing may be necessary for epidemiological  and / or clinical management purposes  to differentiate between  SARS-CoV-2 and other Sarbecovirus currently known to infect humans.  If clinically indicated additional testing with an alternate test  methodology [id number]) is advised. The SARS-CoV-2 RNA is generally  detectable in upper and lower respiratory sp ecimens during the acute  phase of infection. The expected result is Negative.  Fact Sheet for Patients:  StrictlyIdeas.no Fact Sheet for Healthcare Providers: BankingDealers.co.za This test is not yet approved or cleared by the [place] FDA and has been authorized for detection and/or diagnosis of SARS-CoV-2 by FDA under an Emergency Use Authorization (EUA).  This EUA will remain in effect (meaning this test can be used) for the duration of the COVID-19 declaration under Section 564(b)(1) of the Act, 21 U.S.C. section 360bbb-3(b)(1), unless the authorization is terminated or revoked sooner. Performed at [facility], [place] [address], [place], [place] [address]   Culture, blood (routine x 2)     Status: None (Preliminary result)   Collection Time: 02/19/19 10:45 PM  Result Value Ref Range Status   Specimen  Description BLOOD RIGHT ARM  Final   Special Requests   Final    BOTTLES DRAWN AEROBIC AND ANAEROBIC Blood Culture adequate volume   Culture   Final    NO GROWTH 1 DAY Performed at [facility], [place] [address] [place], [place] [address]    Report Status PENDING  Incomplete  Culture, blood (routine x 2)     Status: None (Preliminary result)   Collection Time: 02/19/19 11:00 PM  Result Value Ref Range Status   Specimen Description BLOOD RIGHT HAND  Final   Special Requests   Final    BOTTLES DRAWN AEROBIC ONLY Blood Culture adequate volume   Culture   Final    NO GROWTH 1 DAY Performed at [facility], [place] [address] [place], [place] [address]    Report Status PENDING  Incomplete  MRSA PCR Screening     Status: None   Collection Time: 02/20/19  1:36 AM  Result Value Ref Range Status   MRSA by PCR NEGATIVE NEGATIVE Final    Comment:        The GeneXpert MRSA Assay (FDA approved for NASAL specimens only), is one component of a comprehensive MRSA colonization surveillance program. It is not intended to diagnose MRSA infection nor to guide or monitor treatment for MRSA infections. Performed at [facility], [place] [address] [place], [place] [address]      Labs: BNP (last 3 results) Recent Labs    02/19/19 2033  BNP 644.0*   Basic Metabolic Panel: Recent Labs  Lab 02/19/19 2033 02/19/19 2133 02/20/19 0502 02/21/19 0540  NA 134* 138 141 141  K 4.2 4.3 3.7 3.4*  CL 105  --  107 105  CO2 23  --  22 22  GLUCOSE 123*  --  117* 111*  BUN 22  --  19 15  CREATININE 1.66*  --  1.26* 1.23*  CALCIUM 10.1  --  10.1 10.6*  MG 2.6*  --   --  2.3  PHOS 3.4  --   --   --    Liver Function Tests: Recent Labs  Lab 02/19/19 2033 02/20/19 0502  AST 21 19  ALT 11 13  ALKPHOS 73 82  BILITOT 0.7 0.6  PROT 6.2* 6.1*  ALBUMIN 3.5 3.4*   Recent Labs  Lab 02/19/19 2033  LIPASE 28   Recent Labs  Lab 02/19/19 2033  AMMONIA 18   CBC: Recent Labs   Lab 02/19/19 2033 02/19/19 2133 02/20/19 0502 02/21/19 0540  WBC 7.8  --  8.4 7.4  NEUTROABS 5.1  --   --  4.5  HGB 12.2 12.2 12.1 12.5  HCT 38.5 36.0 37.4 38.0  MCV 101.3*  --  98.7 97.2  PLT  270  --  269 281   Cardiac Enzymes: Recent Labs  Lab 02/19/19 2033  TROPONINI <0.03   BNP: Invalid input(s): POCBNP CBG: No results for input(s): GLUCAP in the last 168 hours. D-Dimer No results for input(s): DDIMER in the last 72 hours. Hgb A1c No results for input(s): HGBA1C in the last 72 hours. Lipid Profile No results for input(s): CHOL, HDL, LDLCALC, TRIG, CHOLHDL, LDLDIRECT in the last 72 hours. Thyroid function studies Recent Labs    02/19/19 2033  TSH 96.452*  T4TOTAL 5.4  T3FREE 1.9*   Anemia work up No results for input(s): VITAMINB12, FOLATE, FERRITIN, TIBC, IRON, RETICCTPCT in the last 72 hours. Urinalysis    Component Value Date/Time   COLORURINE YELLOW 02/19/2019 2040   APPEARANCEUR HAZY (A) 02/19/2019 2040   LABSPEC 1.028 02/19/2019 2040   PHURINE 5.0 02/19/2019 2040   GLUCOSEU NEGATIVE 02/19/2019 2040   HGBUR MODERATE (A) 02/19/2019 2040   BILIRUBINUR NEGATIVE 02/19/2019 2040   KETONESUR NEGATIVE 02/19/2019 2040   PROTEINUR 100 (A) 02/19/2019 2040   NITRITE NEGATIVE 02/19/2019 2040   LEUKOCYTESUR SMALL (A) 02/19/2019 2040   Sepsis Labs Invalid input(s): PROCALCITONIN,  WBC,  LACTICIDVEN Microbiology Recent Results (from the past 240 hour(s))  Urine culture     Status: Abnormal (Preliminary result)   Collection Time: 02/19/19  8:40 PM  Result Value Ref Range Status   Specimen Description URINE, CATHETERIZED  Final   Special Requests NONE  Final   Culture (A)  Final    40,000 COLONIES/mL STAPHYLOCOCCUS HAEMOLYTICUS SUSCEPTIBILITIES TO FOLLOW Performed at [facility], [place] [address] [place], [place] [address]    Report Status PENDING  Incomplete  SARS Coronavirus 2 [facility] order, Performed in [place] hospital lab)     Status: None    Collection Time: 02/19/19  8:40 PM  Result Value Ref Range Status   SARS Coronavirus 2 NEGATIVE NEGATIVE Final    Comment: (NOTE) If result is NEGATIVE SARS-CoV-2 target nucleic acids are NOT DETECTED. The SARS-CoV-2 RNA is generally detectable in upper and lower  respiratory specimens during the acute phase of infection. The lowest  concentration of SARS-CoV-2 viral copies this assay can detect is 250  copies / mL. A negative result does not preclude SARS-CoV-2 infection  and should not be used as the sole basis for treatment or other  patient management decisions.  A negative result may occur with  improper specimen collection / handling, submission of specimen other  than nasopharyngeal swab, presence of viral mutation(s) within the  areas targeted by this assay, and inadequate number of viral copies  (<250 copies / mL). A negative result must be combined with clinical  observations, patient history, and epidemiological information. If result is POSITIVE SARS-CoV-2 target nucleic acids are DETECTED. The SARS-CoV-2 RNA is generally detectable in upper and lower  respiratory specimens dur ing the acute phase of infection.  Positive  results are indicative of active infection with SARS-CoV-2.  Clinical  correlation with patient history and other diagnostic information is  necessary to determine patient infection status.  Positive results do  not rule out bacterial infection or co-infection with other viruses. If result is PRESUMPTIVE POSTIVE SARS-CoV-2 nucleic acids MAY BE PRESENT.   A presumptive positive result was obtained on the submitted specimen  and confirmed on repeat testing.  While 2019 novel coronavirus  (SARS-CoV-2) nucleic acids may be present in the submitted sample  additional confirmatory testing may be necessary for epidemiological  and / or  clinical management purposes  to differentiate between  SARS-CoV-2 and other Sarbecovirus currently known to infect humans.   If clinically indicated additional testing with an alternate test  methodology [phone]) is advised. The SARS-CoV-2 RNA is generally  detectable in upper and lower respiratory sp ecimens during the acute  phase of infection. The expected result is Negative. Fact Sheet for Patients:  StrictlyIdeas.no Fact Sheet for Healthcare Providers: BankingDealers.co.za This test is not yet approved or cleared by the [place] FDA and has been authorized for detection and/or diagnosis of SARS-CoV-2 by FDA under an Emergency Use Authorization (EUA).  This EUA will remain in effect (meaning this test can be used) for the duration of the COVID-19 declaration under Section 564(b)(1) of the Act, 21 U.S.C. section 360bbb-3(b)(1), unless the authorization is terminated or revoked sooner. Performed at [facility], [place] [address], [place], [place] [address]   Culture, blood (routine x 2)     Status: None (Preliminary result)   Collection Time: 02/19/19 10:45 PM  Result Value Ref Range Status   Specimen Description BLOOD RIGHT ARM  Final   Special Requests   Final    BOTTLES DRAWN AEROBIC AND ANAEROBIC Blood Culture adequate volume   Culture   Final    NO GROWTH 1 DAY Performed at [facility], [place] [address], [place], [place] [address]    Report Status PENDING  Incomplete  Culture, blood (routine x 2)     Status: None (Preliminary result)   Collection Time: 02/19/19 11:00 PM  Result Value Ref Range Status   Specimen Description BLOOD RIGHT HAND  Final   Special Requests   Final    BOTTLES DRAWN AEROBIC ONLY Blood Culture adequate volume   Culture   Final    NO GROWTH 1 DAY Performed at [facility], [place] [address], [place], [place] [address]    Report Status PENDING  Incomplete  MRSA PCR Screening     Status: None   Collection Time: 02/20/19  1:36 AM  Result Value Ref Range Status   MRSA by PCR NEGATIVE NEGATIVE Final     Comment:        The GeneXpert MRSA Assay (FDA approved for NASAL specimens only), is one component of a comprehensive MRSA colonization surveillance program. It is not intended to diagnose MRSA infection nor to guide or monitor treatment for MRSA infections. Performed at [facility], [place] [address], [place],  [address]      Time coordinating discharge: 35 minutes  SIGNED:   Aline August, MD  [facility] 02/21/2019, 12:29 PM

## 2019-02-21 NOTE — NC FL2 (Addendum)
[place] LEVEL OF CARE SCREENING TOOL     IDENTIFICATION  Patient Name: Julia Carey Birthdate: 01/16/1928 Sex: female Admission Date (Current Location): 02/19/2019  [facility] and [place] Number:  Herbalist and Address:  [facility] [place], [place] [address] [place],  [address]      Provider Number: [id number]  Attending Physician Name and Address:  Aline August, MD  Relative Name and Phone Number:       Current Level of Care: Hospital Recommended Level of Care: Memory Care Prior Approval Number:    Date Approved/Denied:   PASRR Number:    Discharge Plan: Other (Comment)(Memory Care)    Current Diagnoses: Patient Active Problem List   Diagnosis Date Noted  . Acute metabolic encephalopathy 07/29/5101  . Acute lower UTI 02/19/2019  . Hypothyroidism 09/25/2018  . Dementia ([place]) 09/25/2018  . Fall at home, initial encounter 09/25/2018  . Subarachnoid hemorrhage following injury with brief loss of consciousness but without open intracranial wound ([place]) 09/25/2018  . Accelerated hypertension 09/25/2018  . Muscle weakness (generalized) 09/25/2018  . Subarachnoid hemorrhage ([place]) 09/25/2018    Orientation RESPIRATION BLADDER Height & Weight     Self  Normal Incontinent Weight: 130 lb (59 kg) Height:  5\' 6"  (167.6 cm)  BEHAVIORAL SYMPTOMS/MOOD NEUROLOGICAL BOWEL NUTRITION STATUS      Incontinent Diet(regular)  AMBULATORY STATUS COMMUNICATION OF NEEDS Skin   Extensive Assist Verbally Normal                       Personal Care Assistance Level of Assistance  Bathing, Feeding, Dressing Bathing Assistance: Maximum assistance Feeding assistance: Maximum assistance Dressing Assistance: Maximum assistance     Functional Limitations Info  Sight, Hearing, Speech Sight Info: Adequate Hearing Info: Adequate Speech Info: Impaired    SPECIAL CARE FACTORS FREQUENCY                       Contractures  Contractures Info: Not present    Additional Factors Info  Code Status, Allergies, Psychotropic Code Status Info: DNR Allergies Info: Amoxicillin, Clindamycin/lincomycin, Keflex Cephalexin Psychotropic Info: Aricept 10mg  daily at bed; Cymbalta 30mg  daily; Seroquel 12.5mg  daily at bed         Current Medications (02/21/2019):  This is the current hospital active medication list Current Facility-Administered Medications  Medication Dose Route Frequency Provider Last Rate Last Dose  . acetaminophen (TYLENOL) tablet 325 mg  325 mg Oral TID Elwyn Reach, MD   325 mg at 02/21/19 1016  . aztreonam (AZACTAM) 0.5 g in dextrose 5 % 50 mL IVPB  0.5 g Intravenous Q8H Rumbarger, Rachel L, RPH 100 mL/hr at 02/21/19 1051 0.5 g at 02/21/19 1051  . donepezil (ARICEPT) tablet 10 mg  10 mg Oral QHS Elwyn Reach, MD   10 mg at 02/20/19 2054  . DULoxetine (CYMBALTA) DR capsule 30 mg  30 mg Oral Daily Gala Romney L, MD   30 mg at 02/21/19 1016  . fluticasone (FLONASE) 50 MCG/ACT nasal spray 1 spray  1 spray Each Nare Daily Gala Romney L, MD   1 spray at 02/21/19 1018  . haloperidol lactate (HALDOL) injection 1 mg  1 mg Intravenous Q6H PRN Aline August, MD   1 mg at 02/21/19 0801  . hydrALAZINE (APRESOLINE) injection 5 mg  5 mg Intravenous Q6H PRN Aline August, MD   5 mg at 02/21/19 0209  . levothyroxine (SYNTHROID, LEVOTHROID) injection 50  mcg  50 mcg Intravenous Daily Elwyn Reach, MD   50 mcg at 02/21/19 1016  . loratadine (CLARITIN) tablet 10 mg  10 mg Oral Daily Gala Romney L, MD   10 mg at 02/21/19 1016  . losartan (COZAAR) tablet 50 mg  50 mg Oral Daily Gala Romney L, MD   50 mg at 02/21/19 1016  . Melatonin TABS 3 mg  3 mg Oral QHS Elwyn Reach, MD   3 mg at 02/20/19 2055  . methocarbamol (ROBAXIN) tablet 500 mg  500 mg Oral Q12H PRN Gala Romney L, MD      . metoprolol tartrate (LOPRESSOR) tablet 25 mg  25 mg Oral Daily Gala Romney L, MD   25 mg at 02/21/19  1016  . ondansetron (ZOFRAN) tablet 4 mg  4 mg Oral Q6H PRN Elwyn Reach, MD       Or  . ondansetron (ZOFRAN) injection 4 mg  4 mg Intravenous Q6H PRN Jonelle Sidle, Mohammad L, MD      . pantoprazole (PROTONIX) EC tablet 40 mg  40 mg Oral Daily Gala Romney L, MD   40 mg at 02/21/19 1016  . polyethylene glycol (MIRALAX / GLYCOLAX) packet 17 g  17 g Oral Daily Gala Romney L, MD   17 g at 02/21/19 1017  . polyvinyl alcohol (LIQUIFILM TEARS) 1.4 % ophthalmic solution 1 drop  1 drop Both Eyes BID Gala Romney L, MD   1 drop at 02/21/19 1018  . protein supplement (RESOURCE BENEPROTEIN) powder 6 g  6 g Oral TID PC Garba, Mohammad L, MD   6 g at 02/21/19 1018  . QUEtiapine (SEROQUEL) tablet 12.5 mg  12.5 mg Oral QHS Alekh, Kshitiz, MD      . QUEtiapine (SEROQUEL) tablet 12.5 mg  12.5 mg Oral Daily PRN Alekh, Kshitiz, MD      . vitamin B-12 (CYANOCOBALAMIN) tablet 1,000 mcg  1,000 mcg Oral Ervin Knack, MD   1,000 mcg at 02/20/19 5009     Discharge Medications: TAKE these medications   acetaminophen 325 MG tablet Commonly known as:  TYLENOL Take 325 mg by mouth 3 (three) times daily.   cetirizine 5 MG tablet Commonly known as:  ZYRTEC Take 5 mg by mouth daily.   ciprofloxacin 500 MG tablet Commonly known as:  Cipro Take 1 tablet (500 mg total) by mouth 2 (two) times daily for 3 days.   donepezil 10 MG tablet Commonly known as:  ARICEPT Take 10 mg by mouth at bedtime.   DULoxetine 30 MG capsule Commonly known as:  CYMBALTA Take 30 mg by mouth daily.   fluticasone 50 MCG/ACT nasal spray Commonly known as:  FLONASE Place 1 spray into both nostrils daily.   levothyroxine 100 MCG tablet Commonly known as:  Synthroid Take 1 tablet (100 mcg total) by mouth daily for 30 days. What changed:    medication strength  how much to take   losartan 100 MG tablet Commonly known as:  Cozaar Take 1 tablet (100 mg total) by mouth daily for 30 days. What changed:     medication strength  how much to take   Melatonin 3 MG Tabs Take 3 mg by mouth at bedtime.   methocarbamol 500 MG tablet Commonly known as:  ROBAXIN Take 500 mg by mouth every 12 (twelve) hours as needed for muscle spasms (and/or pain).   metoprolol tartrate 25 MG tablet Commonly known as:  LOPRESSOR Take 25 mg by mouth every morning.  OCUSOFT EYELID CLEANSING EX Place 1 application into both eyes daily.   omeprazole 20 MG capsule Commonly known as:  PRILOSEC Take 20 mg by mouth daily as needed (acid reflux).   polyethylene glycol 17 g packet Commonly known as:  MIRALAX / GLYCOLAX Take 17 g by mouth daily. MIX INTO 8 OUNCES OF LIQUID   PROTEIN PO Take 1 Bottle by mouth 3 (three) times daily after meals.   Refresh Optive Advanced 0.5-1-0.5 % Soln Generic drug:  Carboxymeth-Glycerin-Polysorb Place 1 drop into both eyes 2 (two) times daily.   vitamin B-12 1000 MCG tablet Commonly known as:  CYANOCOBALAMIN Take 1,000 mcg by mouth every other day.     Relevant Imaging Results:  Relevant Lab Results:   Additional Information SS#: [id number]  Geralynn Ochs, LCSW

## 2019-02-21 NOTE — Progress Notes (Signed)
Patient is discharging back to [place]. All belongings sent with patient. Discharge paperwork sent with patient for facility. [facility] has been called for transport. Nurse called report to [facility] at [place]. [place]

## 2019-02-21 NOTE — TOC Initial Note (Signed)
Transition of Care [facility]) - Initial/Assessment Note    Patient Details  Name: Julia Carey MRN: [id number] Date of Birth: [birth year]  [facility] CM/SW Contact:    Geralynn Ochs, LCSW Phone Number: 02/21/2019, 3:22 PM  Clinical Narrative:  CSW spoke with patient's nephew [name] to confirm plan to return to [facility]. CSW coordinated paperwork with Santa Genera RN at [facility] for patient to return.                  Expected Discharge Plan: Memory Care Barriers to Discharge: No Barriers Identified   Patient Goals and CMS Choice Patient states their goals for this hospitalization and ongoing recovery are:: patient unable to participate in goal setting      Expected Discharge Plan and Services Expected Discharge Plan: Memory Care     Post Acute Care Choice: Nursing Home Living arrangements for the past 2 months: [place] Expected Discharge Date: 02/21/19                                    Prior Living Arrangements/Services Living arrangements for the past 2 months: [place] Lives with:: Self, Facility Resident              Current home services: DME    Activities of Daily Living      Permission Sought/Granted                  Emotional Assessment Appearance:: Appears stated age Attitude/Demeanor/Rapport: Unable to Assess Affect (typically observed): Unable to Assess Orientation: : Oriented to Self Alcohol / Substance Use: Not Applicable Psych Involvement: No (comment)  Admission diagnosis:  Metabolic encephalopathy [A26.33] Acute cystitis with hematuria [N30.01] Hypothyroidism, unspecified type [E03.9] Dementia without behavioral disturbance, unspecified dementia type ([place]) [F03.90] Patient Active Problem List   Diagnosis Date Noted  . Acute metabolic encephalopathy 35/45/6256  . Acute lower UTI 02/19/2019  . Hypothyroidism 09/25/2018  . Dementia ([place]) 09/25/2018  . Fall at home,  initial encounter 09/25/2018  . Subarachnoid hemorrhage following injury with brief loss of consciousness but without open intracranial wound ([place]) 09/25/2018  . Accelerated hypertension 09/25/2018  . Muscle weakness (generalized) 09/25/2018  . Subarachnoid hemorrhage ([place]) 09/25/2018   PCP:  Lynnell Catalan, [place] Pharmacy:  No Pharmacies Listed    Social Determinants of Health (SDOH) Interventions    Readmission Risk Interventions No flowsheet data found.

## 2019-02-21 NOTE — Progress Notes (Signed)
Pharmacy Antibiotic Note  Julia Carey is a 85 y.o. female admitted on 02/19/2019 with UTI.  Pharmacy has been consulted for aztreonam dosing. Pt is afebrile and WBC is WNL. Scr has improved to 1.23. Urine culture is pending.   Plan: Change aztreonam to 500mg  IV Q8H F/u renal fxn, C&S, clinical status and LOT  Height: 5\' 6"  (167.6 cm) Weight: 130 lb (59 kg) IBW/kg (Calculated) : 59.3  Temp (24hrs), Avg:97.8 F (36.6 C), Min:97 F (36.1 C), Max:98.6 F (37 C)  Recent Labs  Lab 02/19/19 2033 02/19/19 2151 02/20/19 0502 02/21/19 0540  WBC 7.8  --  8.4 7.4  CREATININE 1.66*  --  1.26* 1.23*  LATICACIDVEN 2.0* 2.6*  --   --     Estimated Creatinine Clearance: 29.4 mL/min (A) (by C-G formula based on SCr of 1.23 mg/dL (H)).    Allergies  Allergen Reactions  . Amoxicillin Hives    "Allergic," per MAR: Did it involve swelling of the face/tongue/throat, SOB, or low BP? Unk Did it involve sudden or severe rash/hives, skin peeling, or any reaction on the inside of your mouth or nose? Unk Did you need to seek medical attention at a hospital or doctor's office? Unk When did it last happen? Unk If all above answers are "NO", may proceed with cephalosporin use.   . Clindamycin/Lincomycin Hives    "Allergic, " per MAR  . Keflex [Cephalexin] Hives    "Allergic, " per MAR    Antimicrobials this admission: Aztreo 5/9>>  Microbiology results: Pending  Thank you for allowing pharmacy to be a part of this patient's care.  Sarinity Dicicco, Rande Lawman 02/21/2019 8:25 AM

## 2019-02-21 NOTE — TOC Transition Note (Signed)
Transition of Care [facility]) - CM/SW Discharge Note   Patient Details  Name: Julia Carey MRN: [id number] Date of Birth: 10-27-1927  [facility] CM/SW Contact:  Geralynn Ochs, LCSW Phone Number: 02/21/2019, 3:23 PM   Clinical Narrative:   Nurse to call report to [phone], [facility]    Final next level of care: Memory Care Barriers to Discharge: No Barriers Identified   Patient Goals and CMS Choice Patient states their goals for this hospitalization and ongoing recovery are:: patient unable to participate in goal setting      Discharge Placement                Patient to be transferred to facility by: [place] Name of family member notified: [name] Patient and family notified of of transfer: 02/21/19  Discharge Plan and Services     Post Acute Care Choice: Nursing Home                               Social Determinants of Health (SDOH) Interventions     Readmission Risk Interventions No flowsheet data found.

## 2019-02-21 NOTE — Progress Notes (Signed)
Noted moderate amount of blood on bed and gown, RFA IV came out noted by charge nurse.

## 2019-02-22 LAB — URINE CULTURE: Culture: 40000 — AB

## 2019-02-25 LAB — CULTURE, BLOOD (ROUTINE X 2)
Culture: NO GROWTH
Culture: NO GROWTH
Special Requests: ADEQUATE
Special Requests: ADEQUATE

## 2019-04-17 ENCOUNTER — Encounter (HOSPITAL_COMMUNITY): Payer: Self-pay

## 2019-04-17 ENCOUNTER — Emergency Department (HOSPITAL_COMMUNITY)
Admission: EM | Admit: 2019-04-17 | Discharge: 2019-04-17 | Disposition: A | Discharge status: Home / Self Care | Payer: Medicare Other | Attending: Emergency Medicine | Admitting: Emergency Medicine

## 2019-04-17 ENCOUNTER — Other Ambulatory Visit: Payer: Self-pay

## 2019-04-17 ENCOUNTER — Emergency Department (HOSPITAL_COMMUNITY): Payer: Medicare Other

## 2019-04-17 DIAGNOSIS — Z79899 Other long term (current) drug therapy: Secondary | ICD-10-CM | POA: Diagnosis not present

## 2019-04-17 DIAGNOSIS — Y939 Activity, unspecified: Secondary | ICD-10-CM | POA: Diagnosis not present

## 2019-04-17 DIAGNOSIS — N3000 Acute cystitis without hematuria: Secondary | ICD-10-CM | POA: Diagnosis not present

## 2019-04-17 DIAGNOSIS — S0990XA Unspecified injury of head, initial encounter: Secondary | ICD-10-CM | POA: Insufficient documentation

## 2019-04-17 DIAGNOSIS — Y92122 Bedroom in nursing home as the place of occurrence of the external cause: Secondary | ICD-10-CM | POA: Diagnosis not present

## 2019-04-17 DIAGNOSIS — I10 Essential (primary) hypertension: Secondary | ICD-10-CM | POA: Diagnosis not present

## 2019-04-17 DIAGNOSIS — F0390 Unspecified dementia without behavioral disturbance: Secondary | ICD-10-CM | POA: Insufficient documentation

## 2019-04-17 DIAGNOSIS — E039 Hypothyroidism, unspecified: Secondary | ICD-10-CM | POA: Insufficient documentation

## 2019-04-17 DIAGNOSIS — Y998 Other external cause status: Secondary | ICD-10-CM | POA: Diagnosis not present

## 2019-04-17 DIAGNOSIS — W06XXXA Fall from bed, initial encounter: Secondary | ICD-10-CM | POA: Diagnosis not present

## 2019-04-17 LAB — CBC WITH DIFFERENTIAL/PLATELET
Abs Immature Granulocytes: 0.02 10*3/uL (ref 0.00–0.07)
Basophils Absolute: 0 10*3/uL (ref 0.0–0.1)
Basophils Relative: 1 %
Eosinophils Absolute: 0.4 10*3/uL (ref 0.0–0.5)
Eosinophils Relative: 5 %
HCT: 38 % (ref 36.0–46.0)
Hemoglobin: 11.9 g/dL — ABNORMAL LOW (ref 12.0–15.0)
Immature Granulocytes: 0 %
Lymphocytes Relative: 19 %
Lymphs Abs: 1.4 10*3/uL (ref 0.7–4.0)
MCH: 32.2 pg (ref 26.0–34.0)
MCHC: 31.3 g/dL (ref 30.0–36.0)
MCV: 103 fL — ABNORMAL HIGH (ref 80.0–100.0)
Monocytes Absolute: 0.8 10*3/uL (ref 0.1–1.0)
Monocytes Relative: 11 %
Neutro Abs: 5.1 10*3/uL (ref 1.7–7.7)
Neutrophils Relative %: 64 %
Platelets: 298 10*3/uL (ref 150–400)
RBC: 3.69 MIL/uL — ABNORMAL LOW (ref 3.87–5.11)
RDW: 12.9 % (ref 11.5–15.5)
WBC: 7.8 10*3/uL (ref 4.0–10.5)
nRBC: 0 % (ref 0.0–0.2)

## 2019-04-17 LAB — COMPREHENSIVE METABOLIC PANEL
ALT: 14 U/L (ref 0–44)
AST: 19 U/L (ref 15–41)
Albumin: 3.4 g/dL — ABNORMAL LOW (ref 3.5–5.0)
Alkaline Phosphatase: 101 U/L (ref 38–126)
Anion gap: 5 (ref 5–15)
BUN: 26 mg/dL — ABNORMAL HIGH (ref 8–23)
CO2: 26 mmol/L (ref 22–32)
Calcium: 10 mg/dL (ref 8.9–10.3)
Chloride: 110 mmol/L (ref 98–111)
Creatinine, Ser: 1.15 mg/dL — ABNORMAL HIGH (ref 0.44–1.00)
GFR calc Af Amer: 49 mL/min — ABNORMAL LOW (ref >60)
GFR calc non Af Amer: 42 mL/min — ABNORMAL LOW (ref >60)
Glucose, Bld: 106 mg/dL — ABNORMAL HIGH (ref 70–99)
Potassium: 4.4 mmol/L (ref 3.5–5.1)
Sodium: 141 mmol/L (ref 135–145)
Total Bilirubin: 0.5 mg/dL (ref 0.3–1.2)
Total Protein: 6.7 g/dL (ref 6.5–8.1)

## 2019-04-17 LAB — URINALYSIS, ROUTINE W REFLEX MICROSCOPIC
Bilirubin Urine: NEGATIVE
Glucose, UA: NEGATIVE mg/dL
Hgb urine dipstick: NEGATIVE
Ketones, ur: NEGATIVE mg/dL
Nitrite: POSITIVE — AB
Protein, ur: NEGATIVE mg/dL
Specific Gravity, Urine: 1.014 (ref 1.005–1.030)
WBC, UA: >50 WBC/hpf — ABNORMAL HIGH (ref 0–5)
pH: 6 (ref 5.0–8.0)

## 2019-04-17 LAB — PROTIME-INR
INR: 1 (ref 0.8–1.2)
Prothrombin Time: 12.9 seconds (ref 11.4–15.2)

## 2019-04-17 LAB — CBG MONITORING, ED: Glucose-Capillary: 97 mg/dL (ref 70–99)

## 2019-04-17 MED ORDER — SULFAMETHOXAZOLE-TRIMETHOPRIM 800-160 MG PO TABS: 1.0000 | ORAL_TABLET | Freq: Two times a day (BID) | ORAL | 0 refills | Status: AC

## 2019-04-17 MED ORDER — SULFAMETHOXAZOLE-TRIMETHOPRIM 800-160 MG PO TABS
1.0000 | ORAL_TABLET | Freq: Once | ORAL | Status: AC
  Administered 2019-04-17: 18:00:00 1 via ORAL
  Filled 2019-04-17: qty 1

## 2019-04-17 NOTE — ED Provider Notes (Signed)
[facility] DEPT Provider Note   CSN: [id number] Arrival date & time: 04/17/19  1435    History   Chief Complaint Chief Complaint  Patient presents with  . Fall    HPI Julia Carey is a 85 y.o. female.     HPI Patient presents to the ED for evaluation of an unwitnessed fall.  Patient is a resident of a nursing care facility.  She has history of dementia.  Patient was found after she appeared to fall out of her bed.  This was not witnessed.  Patient had a notable area of bruising and swelling on her left forehead.  Patient has a history of dementia.  In the ED, she will respond to me but does not answer any questions.  I am unable to obtain further history. Past Medical History:  Diagnosis Date  . Age-related osteoporosis without current pathological fracture   . Dementia ([place])   . Dementia ([place])   . Dysphagia   . Fracture of first cervical vertebra (HCC)   . Hypothyroidism   . Insomnia   . Major depression, single episode   . Muscle weakness (generalized)     Patient Active Problem List   Diagnosis Date Noted  . Acute metabolic encephalopathy 34/19/6222  . Acute lower UTI 02/19/2019  . Hypothyroidism 09/25/2018  . Dementia ([place]) 09/25/2018  . Fall at home, initial encounter 09/25/2018  . Subarachnoid hemorrhage following injury with brief loss of consciousness but without open intracranial wound ([place]) 09/25/2018  . Accelerated hypertension 09/25/2018  . Muscle weakness (generalized) 09/25/2018  . Subarachnoid hemorrhage ([place]) 09/25/2018    History reviewed. No pertinent surgical history.   OB History   No obstetric history on file.      Home Medications    Prior to Admission medications   Medication Sig Start Date End Date Taking? Authorizing Provider  acetaminophen (TYLENOL) 325 MG tablet Take 325 mg by mouth 3 (three) times daily.    Yes [provider]  Carboxymeth-Glycerin-Polysorb (REFRESH OPTIVE ADVANCED)  0.5-1-0.5 % SOLN Place 1 drop into both eyes 2 (two) times daily.    Yes [provider]  cetirizine (ZYRTEC) 5 MG tablet Take 5 mg by mouth daily.   Yes [provider]  donepezil (ARICEPT) 10 MG tablet Take 10 mg by mouth at bedtime.    Yes [provider]  DULoxetine (CYMBALTA) 30 MG capsule Take 30 mg by mouth daily.   Yes [provider]  Eyelid Cleansers (OCUSOFT EYELID CLEANSING EX) Place 1 application into both eyes daily.    Yes [provider]  fluticasone (FLONASE) 50 MCG/ACT nasal spray Place 1 spray into both nostrils daily.   Yes [provider]  levothyroxine (SYNTHROID) 100 MCG tablet Take 1 tablet (100 mcg total) by mouth daily for 30 days. 02/21/19 04/17/19 Yes Aline August, MD  losartan (COZAAR) 100 MG tablet Take 1 tablet (100 mg total) by mouth daily for 30 days. 02/21/19 04/17/19 Yes Aline August, MD  Melatonin 3 MG TABS Take 3 mg by mouth at bedtime.   Yes [provider]  metoprolol tartrate (LOPRESSOR) 25 MG tablet Take 25 mg by mouth every morning.    Yes [provider]  polyethylene glycol (MIRALAX / GLYCOLAX) packet Take 17 g by mouth daily. MIX INTO 8 OUNCES OF LIQUID   Yes [provider]  PROTEIN PO Take 1 Bottle by mouth 3 (three) times daily after meals.   Yes [provider]  vitamin  B-12 (CYANOCOBALAMIN) 1000 MCG tablet Take 1,000 mcg by mouth every other day.    Yes [provider]  sulfamethoxazole-trimethoprim (BACTRIM DS) 800-160 MG tablet Take 1 tablet by mouth 2 (two) times daily for 7 days. 04/17/19 04/24/19  Dorie Rank, MD    Family History History reviewed. No pertinent family history.  Social History Social History   Tobacco Use  . Smoking status: Unknown If Ever Smoked  . Smokeless tobacco: Never Used  Substance Use Topics  . Alcohol use: Not Currently  . Drug use: Not Currently     Allergies   Amoxicillin, Clindamycin/lincomycin, and Keflex  [cephalexin]   Review of Systems Review of Systems  All other systems reviewed and are negative.    Physical Exam Updated Vital Signs BP (!) 218/84   Pulse 64   Temp 97.9 F (36.6 C) (Oral)   Resp 19   SpO2 99%   Physical Exam Vitals signs and nursing note reviewed.  Constitutional:      General: She is not in acute distress.    Appearance: She is well-developed.     Comments: Elderly, frail  HENT:     Head: Normocephalic.     Comments: Hematoma left forehead    Right Ear: External ear normal.     Left Ear: External ear normal.  Eyes:     General: No scleral icterus.       Right eye: No discharge.        Left eye: No discharge.     Conjunctiva/sclera: Conjunctivae normal.  Neck:     Musculoskeletal: Neck supple.     Trachea: No tracheal deviation.  Cardiovascular:     Rate and Rhythm: Normal rate and regular rhythm.  Pulmonary:     Effort: Pulmonary effort is normal. No respiratory distress.     Breath sounds: Normal breath sounds. No stridor. No wheezing or rales.  Abdominal:     General: Bowel sounds are normal. There is no distension.     Palpations: Abdomen is soft.     Tenderness: There is no abdominal tenderness. There is no guarding or rebound.  Musculoskeletal:        General: No tenderness.     Right shoulder: She exhibits no tenderness, no bony tenderness and no swelling.     Left shoulder: She exhibits no tenderness, no bony tenderness and no swelling.     Right wrist: She exhibits no tenderness, no bony tenderness and no swelling.     Left wrist: She exhibits no tenderness, no bony tenderness and no swelling.     Right hip: She exhibits normal range of motion, no tenderness, no bony tenderness and no swelling.     Left hip: She exhibits normal range of motion, no tenderness and no bony tenderness.     Right ankle: She exhibits no swelling. No tenderness.     Left ankle: She exhibits no swelling. No tenderness.     Cervical back: She exhibits no  tenderness, no bony tenderness and no swelling.     Thoracic back: She exhibits no tenderness, no bony tenderness and no swelling.     Lumbar back: She exhibits no tenderness, no bony tenderness and no swelling.  Skin:    General: Skin is warm and dry.     Findings: No rash.  Neurological:     Mental Status: She is alert.     Cranial Nerves: No cranial nerve deficit (no facial droop, extraocular movements intact,   ).  Sensory: No sensory deficit.     Motor: No abnormal muscle tone or seizure activity.     Coordination: Coordination normal.     Comments: Patient will lift both arms off the bed without drift, she will lift both legs off the bed without any difficulty she does follow commands but does not speak to me in response to any questions      ED Treatments / Results  Labs (all labs ordered are listed, but only abnormal results are displayed) Labs Reviewed  COMPREHENSIVE METABOLIC PANEL - Abnormal; Notable for the following components:      Result Value   Glucose, Bld 106 (*)    BUN 26 (*)    Creatinine, Ser 1.15 (*)    Albumin 3.4 (*)    GFR calc non Af Amer 42 (*)    GFR calc Af Amer 49 (*)    All other components within normal limits  CBC WITH DIFFERENTIAL/PLATELET - Abnormal; Notable for the following components:   RBC 3.69 (*)    Hemoglobin 11.9 (*)    MCV 103.0 (*)    All other components within normal limits  URINALYSIS, ROUTINE W REFLEX MICROSCOPIC - Abnormal; Notable for the following components:   APPearance HAZY (*)    Nitrite POSITIVE (*)    Leukocytes,Ua LARGE (*)    WBC, UA >50 (*)    Bacteria, UA RARE (*)    All other components within normal limits  URINE CULTURE  PROTIME-INR  CBG MONITORING, ED    EKG None  Radiology Ct Head Wo Contrast  Result Date: 04/17/2019 CLINICAL DATA:  Dementia.  Status post fall EXAM: CT HEAD WITHOUT CONTRAST CT CERVICAL SPINE WITHOUT CONTRAST TECHNIQUE: Multidetector CT imaging of the head and cervical spine was  performed following the standard protocol without intravenous contrast. Multiplanar CT image reconstructions of the cervical spine were also generated. COMPARISON:  Feb 19, 2019 CT head, CT cervical spine January 23, 2019 FINDINGS: CT HEAD FINDINGS Brain: No evidence of acute infarction, hemorrhage, hydrocephalus, extra-axial collection or mass lesion/mass effect. There is chronic diffuse atrophy. Chronic bilateral periventricular white matter small vessel ischemic changes noted. Vascular: No hyperdense vessel is noted. Skull: Normal. Negative for fracture or focal lesion. Sinuses/Orbits: No acute finding. Other: None. CT CERVICAL SPINE FINDINGS Alignment: Stable alignment.  Stable dextroscoliosis unchanged. Skull base and vertebrae: Stable chronic deformity of C1 arch unchanged. No acute displaced fracture. Soft tissues and spinal canal: No prevertebral fluid or swelling. No visible canal hematoma. Disc levels: Degenerative spondylosis throughout the cervical spine with narrowed joint space osteophyte formation identified. Upper chest: Scarring of the lung apices are identified unchanged. Other: Carotid atherosclerosis. IMPRESSION: No focal acute intracranial abnormality identified. Chronic diffuse atrophy and chronic bilateral periventricular white matter small vessel ischemic change. No acute fracture or dislocation identified in the cervical spine. Chronic deformity of C1 arch unchanged. Electronically Signed   By: Abelardo Diesel M.D.   On: 04/17/2019 16:58   Ct Cervical Spine Wo Contrast  Result Date: 04/17/2019 CLINICAL DATA:  Dementia.  Status post fall EXAM: CT HEAD WITHOUT CONTRAST CT CERVICAL SPINE WITHOUT CONTRAST TECHNIQUE: Multidetector CT imaging of the head and cervical spine was performed following the standard protocol without intravenous contrast. Multiplanar CT image reconstructions of the cervical spine were also generated. COMPARISON:  Feb 19, 2019 CT head, CT cervical spine January 23, 2019  FINDINGS: CT HEAD FINDINGS Brain: No evidence of acute infarction, hemorrhage, hydrocephalus, extra-axial collection or mass lesion/mass effect. There is chronic  diffuse atrophy. Chronic bilateral periventricular white matter small vessel ischemic changes noted. Vascular: No hyperdense vessel is noted. Skull: Normal. Negative for fracture or focal lesion. Sinuses/Orbits: No acute finding. Other: None. CT CERVICAL SPINE FINDINGS Alignment: Stable alignment.  Stable dextroscoliosis unchanged. Skull base and vertebrae: Stable chronic deformity of C1 arch unchanged. No acute displaced fracture. Soft tissues and spinal canal: No prevertebral fluid or swelling. No visible canal hematoma. Disc levels: Degenerative spondylosis throughout the cervical spine with narrowed joint space osteophyte formation identified. Upper chest: Scarring of the lung apices are identified unchanged. Other: Carotid atherosclerosis. IMPRESSION: No focal acute intracranial abnormality identified. Chronic diffuse atrophy and chronic bilateral periventricular white matter small vessel ischemic change. No acute fracture or dislocation identified in the cervical spine. Chronic deformity of C1 arch unchanged. Electronically Signed   By: Abelardo Diesel M.D.   On: 04/17/2019 16:58    Procedures Procedures (including critical care time)  Medications Ordered in ED Medications  sulfamethoxazole-trimethoprim (BACTRIM DS) 800-160 MG per tablet 1 tablet (has no administration in time range)     Initial Impression / Assessment and Plan / ED Course  I have reviewed the triage vital signs and the nursing notes.  Pertinent labs & imaging results that were available during my care of the patient were reviewed by me and considered in my medical decision making (see chart for details).  Clinical Course as of Apr 17 1819  Sun Apr 17, 2019  1611 D/w son about her current status.  Inidicated that we should let her know that we spoke to [place] Ronalee Belts,    they request UA to be checked.     [JK]  1816 Urinalysis does suggest UTI.   [BT]  5974 Other labs and imaging tests unremarkable.  Hypertension noted but after discussion with family this is chronic.   [BU]  3845 Patient has been up and walking.   [XM]  4680   She remains confused but she does answer patients.  Plan on discharge home back to the nursing facility.  We will treat for urinary tract infection.   [JK]    Clinical Course User Index [JK] Dorie Rank, MD     No signs of serious injury associated with the fall.  Urinalysis does suggest a urinary tract infection.  Plan on discharge back to the nursing facility with a prescription for Bactrim.  Previous urine cultures indicated sensitivity to Septra.  Final Clinical Impressions(s) / ED Diagnoses   Final diagnoses:  Injury of head, initial encounter  Acute cystitis without hematuria  Hypertension, unspecified type    ED Discharge Orders         Ordered    sulfamethoxazole-trimethoprim (BACTRIM DS) 800-160 MG tablet  2 times daily     04/17/19 1819           Dorie Rank, MD 04/18/19 1310

## 2019-04-17 NOTE — ED Notes (Signed)
Patient transported to CT 

## 2019-04-17 NOTE — ED Notes (Signed)
Bed: WA01 Expected date:  Expected time:  Means of arrival:  Comments: Fall

## 2019-04-17 NOTE — ED Triage Notes (Signed)
Pt BIB EMS from [place]. Pt had unwitnessed fall out of bed. Pt has old swelling on left head but facility states it from today. No blood thinners. Pt has hx of dementia.

## 2019-04-17 NOTE — Discharge Instructions (Addendum)
Take the antibiotics as prescribed until finished.  The x-rays and laboratory test did not show any signs of any serious injuries associated with the fall.  Follow-up with her primary care doctor to make sure the infection clears.

## 2019-04-19 LAB — URINE CULTURE: Culture: >=100000 — AB

## 2019-04-20 ENCOUNTER — Telehealth: Payer: Self-pay

## 2019-04-20 NOTE — Telephone Encounter (Signed)
Post ED Visit - Positive Culture Follow-up  Culture report reviewed by antimicrobial stewardship pharmacist: [place] Team []  Elenor Quinones, Pharm.D. []  Heide Guile, Pharm.D., BCPS AQ-ID []  Parks Neptune, Pharm.D., BCPS []  Alycia Rossetti, Pharm.D., BCPS []  [place], Pharm.D., BCPS, AAHIVP []  Legrand Como, Pharm.D., BCPS, AAHIVP []  Salome Arnt, PharmD, BCPS []  Johnnette Gourd, PharmD, BCPS []  Hughes Better, PharmD, BCPS []  Leeroy Cha, PharmD []  Laqueta Linden, PharmD, BCPS []  Albertina Parr, PharmD  [place] Team []  Leodis Sias, PharmD []  Lindell Spar, PharmD []  Royetta Asal, PharmD [x]  Graylin Shiver, Rph []  Rema Fendt) Glennon Mac, PharmD []  Arlyn Dunning, PharmD []  Netta Cedars, PharmD []  Dia Sitter, PharmD []  Leone Haven, PharmD []  Gretta Arab, PharmD []  Theodis Shove, PharmD []  Peggyann Juba, PharmD []  Reuel Boom, PharmD   Positive urine culture Treated with BactrimDS, organism sensitive to the same and no further patient follow-up is required at this time.  Genia Del 04/20/2019, 10:05 AM

## 2019-10-01 ENCOUNTER — Other Ambulatory Visit: Payer: Self-pay

## 2019-10-01 ENCOUNTER — Emergency Department (HOSPITAL_COMMUNITY)
Admission: EM | Admit: 2019-10-01 | Discharge: 2019-10-01 | Disposition: A | Discharge status: Home / Self Care | Payer: Medicare Other | Attending: Emergency Medicine | Admitting: Emergency Medicine

## 2019-10-01 ENCOUNTER — Emergency Department (HOSPITAL_COMMUNITY): Payer: Medicare Other

## 2019-10-01 ENCOUNTER — Encounter (HOSPITAL_COMMUNITY): Payer: Self-pay

## 2019-10-01 DIAGNOSIS — F0390 Unspecified dementia without behavioral disturbance: Secondary | ICD-10-CM | POA: Insufficient documentation

## 2019-10-01 DIAGNOSIS — R82998 Other abnormal findings in urine: Secondary | ICD-10-CM | POA: Insufficient documentation

## 2019-10-01 DIAGNOSIS — Y999 Unspecified external cause status: Secondary | ICD-10-CM | POA: Diagnosis not present

## 2019-10-01 DIAGNOSIS — Y92121 Bathroom in nursing home as the place of occurrence of the external cause: Secondary | ICD-10-CM | POA: Insufficient documentation

## 2019-10-01 DIAGNOSIS — W19XXXA Unspecified fall, initial encounter: Secondary | ICD-10-CM | POA: Diagnosis not present

## 2019-10-01 DIAGNOSIS — R001 Bradycardia, unspecified: Secondary | ICD-10-CM | POA: Insufficient documentation

## 2019-10-01 DIAGNOSIS — Z79899 Other long term (current) drug therapy: Secondary | ICD-10-CM | POA: Diagnosis not present

## 2019-10-01 DIAGNOSIS — E039 Hypothyroidism, unspecified: Secondary | ICD-10-CM | POA: Diagnosis not present

## 2019-10-01 DIAGNOSIS — Y9389 Activity, other specified: Secondary | ICD-10-CM | POA: Diagnosis not present

## 2019-10-01 DIAGNOSIS — S0990XA Unspecified injury of head, initial encounter: Secondary | ICD-10-CM | POA: Diagnosis not present

## 2019-10-01 LAB — URINALYSIS, ROUTINE W REFLEX MICROSCOPIC
Bilirubin Urine: NEGATIVE
Glucose, UA: NEGATIVE mg/dL
Hgb urine dipstick: NEGATIVE
Ketones, ur: NEGATIVE mg/dL
Nitrite: NEGATIVE
Protein, ur: 30 mg/dL — AB
Specific Gravity, Urine: 1.021 (ref 1.005–1.030)
pH: 5 (ref 5.0–8.0)

## 2019-10-01 MED ORDER — FOSFOMYCIN TROMETHAMINE 3 G PO PACK
3.0000 g | PACK | Freq: Once | ORAL | Status: AC
  Administered 2019-10-01: 3 g via ORAL
  Filled 2019-10-01: qty 3

## 2019-10-01 NOTE — ED Notes (Signed)
Pt has not voided at this time, will continue to monitor.

## 2019-10-01 NOTE — ED Notes (Signed)
Pt was repositioned, peri-care provided, new brief applied, new chucks applied, purewick applied by EMT.

## 2019-10-01 NOTE — ED Provider Notes (Signed)
[facility] DEPT Provider Note   CSN: [id number] Arrival date & time: 10/01/19  1019     History Chief Complaint  Patient presents with  . Fall  . Bradycardia    Julia Carey is a 85 y.o. female presenting for evaluation after fall.  Level 5 caveat due to dementia.  History obtained from EMS.  Per EMS, patient is wheelchair-bound.  She is in a dementia unit.  She was given breakfast and her morning meds.  Staff heard her fall, she was next to the bed, apparently had tried to stand up and walk.  She was lying on her left side.  There is no loss of consciousness.  She is not on blood thinners.  Patient was found to be bradycardic around the 40s with EMS transport.  Pt denies pain or complaints at this time.   Additional history obtained from chart review.  Patient with a history of dementia, hypertension, hypothyroidism, generalized weakness, previous vertebral fracture.  Additional history obtained [facility] at [facility].  Patient was found on the bathroom floor on her left side.  Heart rate was noted to be 44, thus EMS was called.  She was last seen at 830 this morning.  She normally walks with a walker (and supervision) or uses a wheelchair.  She takes aspirin daily, no blood thinners.  She fell several days ago as well, which is not normal for her.  No recent medication changes, no change in her blood pressure medicine including her metoprolol.   HPI     Past Medical History:  Diagnosis Date  . Age-related osteoporosis without current pathological fracture   . Dementia ([place])   . Dementia ([place])   . Dysphagia   . Fracture of first cervical vertebra ([facility])   . Hypothyroidism   . Insomnia   . Major depression, single episode   . Muscle weakness (generalized)     Patient Active Problem List   Diagnosis Date Noted  . Acute metabolic encephalopathy 0000000  . Acute lower UTI 02/19/2019  . Hypothyroidism 09/25/2018  .  Dementia ([place]) 09/25/2018  . Fall at home, initial encounter 09/25/2018  . Subarachnoid hemorrhage following injury with brief loss of consciousness but without open intracranial wound ([place]) 09/25/2018  . Accelerated hypertension 09/25/2018  . Muscle weakness (generalized) 09/25/2018  . Subarachnoid hemorrhage ([place]) 09/25/2018    History reviewed. No pertinent surgical history.   OB History   No obstetric history on file.     History reviewed. No pertinent family history.  Social History   Tobacco Use  . Smoking status: Unknown If Ever Smoked  . Smokeless tobacco: Never Used  Substance Use Topics  . Alcohol use: Not Currently  . Drug use: Not Currently    Home Medications Prior to Admission medications   Medication Sig Start Date End Date Taking? Authorizing Provider  acetaminophen (TYLENOL) 325 MG tablet Take 325 mg by mouth 3 (three) times daily.    Yes [provider]  aspirin 81 MG chewable tablet Chew 81 mg by mouth daily.   Yes [provider]  Carboxymeth-Glycerin-Polysorb (REFRESH OPTIVE ADVANCED) 0.5-1-0.5 % SOLN Place 1 drop into both eyes 2 (two) times daily.    Yes [provider]  cetirizine (ZYRTEC) 5 MG tablet Take 5 mg by mouth daily.   Yes [provider]  donepezil (ARICEPT) 10 MG tablet Take 10 mg by mouth at bedtime.    Yes [provider]  DULoxetine (CYMBALTA)  30 MG capsule Take 30 mg by mouth daily.   Yes [provider]  Eyelid Cleansers (OCUSOFT EYELID CLEANSING EX) Place 1 application into both eyes daily.    Yes [provider]  fluticasone (FLONASE) 50 MCG/ACT nasal spray Place 1 spray into both nostrils daily.   Yes [provider]  levothyroxine (SYNTHROID) 112 MCG tablet Take 112 mcg by mouth daily before breakfast.   Yes [provider]  losartan (COZAAR) 100 MG tablet Take 1 tablet (100 mg total) by mouth daily for 30 days. 02/21/19 10/01/19 Yes Aline August, MD    Melatonin 3 MG TABS Take 3 mg by mouth at bedtime.   Yes [provider]  metoprolol tartrate (LOPRESSOR) 25 MG tablet Take 25 mg by mouth every morning.    Yes [provider]  polyethylene glycol (MIRALAX / GLYCOLAX) packet Take 17 g by mouth daily. MIX INTO 8 OUNCES OF LIQUID   Yes [provider]  PROTEIN PO Take 1 Bottle by mouth 3 (three) times daily after meals.   Yes [provider]  vitamin B-12 (CYANOCOBALAMIN) 1000 MCG tablet Take 1,000 mcg by mouth every other day.    Yes [provider]  levothyroxine (SYNTHROID) 100 MCG tablet Take 1 tablet (100 mcg total) by mouth daily for 30 days. 02/21/19 04/17/19  Aline August, MD    Allergies    Amoxicillin, Clindamycin/lincomycin, and Keflex [cephalexin]  Review of Systems   Review of Systems  Unable to perform ROS: Dementia    Physical Exam Updated Vital Signs BP (!) 150/50   Pulse (!) 47   Temp 98.4 F (36.9 C)   Resp 14   SpO2 98%   Physical Exam Vitals and nursing note reviewed.  Constitutional:      General: She is not in acute distress.    Appearance: She is well-developed.     Comments: Elderly female sitting comfortably in the bed no acute distress  HENT:     Head: Normocephalic and atraumatic.     Comments: No sign of head trauma Eyes:     Extraocular Movements: Extraocular movements intact.     Conjunctiva/sclera: Conjunctivae normal.     Pupils: Pupils are equal, round, and reactive to light.  Neck:     Comments: In c-collar Cardiovascular:     Rate and Rhythm: Regular rhythm. Bradycardia present.     Pulses: Normal pulses.  Pulmonary:     Effort: Pulmonary effort is normal. No respiratory distress.     Breath sounds: Normal breath sounds. No wheezing.     Comments: No obvious chest tenderness or deformity. Clear lung sounds.  Chest:     Chest wall: No tenderness.  Abdominal:     General: There is no distension.     Palpations: Abdomen is soft. There is no  mass.     Tenderness: There is no abdominal tenderness. There is no guarding or rebound.  Musculoskeletal:        General: Normal range of motion.     Comments: No obvious deformity. Able to sit up and bend at hips without signs of pain. No ttp of back or midline spine. Pelvis stable. Radial and pedal pulses 2+ bilaterally.   Skin:    General: Skin is warm and dry.     Capillary Refill: Capillary refill takes less than 2 seconds.  Neurological:     Mental Status: She is alert.     Comments: Able to respond to yes/no questions. baseline per  EMS     ED Results / Procedures / Treatments   Labs (all labs ordered are listed, but only abnormal results are displayed) Labs Reviewed  URINALYSIS, ROUTINE W REFLEX MICROSCOPIC - Abnormal; Notable for the following components:      Result Value   Protein, ur 30 (*)    Leukocytes,Ua TRACE (*)    Bacteria, UA MANY (*)    All other components within normal limits  URINE CULTURE    EKG EKG Interpretation  Date/Time:  Saturday October 01 2019 10:32:15 EST Ventricular Rate:  44 PR Interval:    QRS Duration: 107 QT Interval:  495 QTC Calculation: 424 R Axis:   1 Text Interpretation: Age not entered, assumed to be  85 years old for purpose of ECG interpretation Sinus bradycardia Probable LVH with secondary repol abnrm T wave abnormality Artifact Abnormal ECG Confirmed by Carmin Muskrat [phone]) on 10/01/2019 11:21:59 AM   Radiology DG Chest 2 View  Result Date: 10/01/2019 CLINICAL DATA:  Fall. EXAM: CHEST - 2 VIEW COMPARISON:  Feb 19, 2019 FINDINGS: Stable cardiomegaly. The hila and mediastinum are unchanged. No pneumothorax. No nodules or masses. No focal infiltrates. IMPRESSION: No active cardiopulmonary disease. Electronically Signed   By: Dorise Bullion III M.D   On: 10/01/2019 11:40   CT Head Wo Contrast  Result Date: 10/01/2019 CLINICAL DATA:  85 year old female with head and neck injury. Initial encounter. EXAM: CT HEAD WITHOUT  CONTRAST CT CERVICAL SPINE WITHOUT CONTRAST TECHNIQUE: Multidetector CT imaging of the head and cervical spine was performed following the standard protocol without intravenous contrast. Multiplanar CT image reconstructions of the cervical spine were also generated. COMPARISON:  04/17/2019 and prior CTs FINDINGS: CT HEAD FINDINGS Brain: No evidence of acute infarction, hemorrhage, hydrocephalus, extra-axial collection or mass lesion/mass effect. Atrophy and chronic small-vessel white matter ischemic changes are again noted. A remote RIGHT white matter infarct is now noted. Vascular: Carotid atherosclerotic calcifications again noted. Skull: Normal. Negative for fracture or focal lesion. Sinuses/Orbits: No acute abnormality Other: None CT CERVICAL SPINE FINDINGS Alignment: Unchanged. Retrolisthesis of C5 in relation to C4 and C6 is unchanged. No new subluxation. Moderate to severe dextroscoliosis again identified. Skull base and vertebrae: Chronic C1 arch deformity again noted. No acute fracture is identified. Soft tissues and spinal canal: No prevertebral fluid or swelling. No visible canal hematoma. Disc levels: Moderate multilevel degenerative disc disease, spondylosis and facet arthropathy again identified. Upper chest: No acute abnormality. Other: None IMPRESSION: 1. No evidence of acute intracranial abnormality. Atrophy, chronic small-vessel white matter ischemic changes and remote RIGHT white matter infarct. 2. No static evidence of acute injury to the cervical spine. Unchanged scoliosis and multilevel degenerative changes. Electronically Signed   By: Margarette Canada M.D.   On: 10/01/2019 12:07   CT Cervical Spine Wo Contrast  Result Date: 10/01/2019 CLINICAL DATA:  85 year old female with head and neck injury. Initial encounter. EXAM: CT HEAD WITHOUT CONTRAST CT CERVICAL SPINE WITHOUT CONTRAST TECHNIQUE: Multidetector CT imaging of the head and cervical spine was performed following the standard protocol  without intravenous contrast. Multiplanar CT image reconstructions of the cervical spine were also generated. COMPARISON:  04/17/2019 and prior CTs FINDINGS: CT HEAD FINDINGS Brain: No evidence of acute infarction, hemorrhage, hydrocephalus, extra-axial collection or mass lesion/mass effect. Atrophy and chronic small-vessel white matter ischemic changes are again noted. A remote RIGHT white matter infarct is now noted. Vascular: Carotid atherosclerotic calcifications again noted. Skull: Normal. Negative for fracture or focal lesion. Sinuses/Orbits: No acute abnormality  Other: None CT CERVICAL SPINE FINDINGS Alignment: Unchanged. Retrolisthesis of C5 in relation to C4 and C6 is unchanged. No new subluxation. Moderate to severe dextroscoliosis again identified. Skull base and vertebrae: Chronic C1 arch deformity again noted. No acute fracture is identified. Soft tissues and spinal canal: No prevertebral fluid or swelling. No visible canal hematoma. Disc levels: Moderate multilevel degenerative disc disease, spondylosis and facet arthropathy again identified. Upper chest: No acute abnormality. Other: None IMPRESSION: 1. No evidence of acute intracranial abnormality. Atrophy, chronic small-vessel white matter ischemic changes and remote RIGHT white matter infarct. 2. No static evidence of acute injury to the cervical spine. Unchanged scoliosis and multilevel degenerative changes. Electronically Signed   By: Margarette Canada M.D.   On: 10/01/2019 12:07    Procedures Procedures (including critical care time)  Medications Ordered in ED Medications  fosfomycin (MONUROL) packet 3 g (has no administration in time range)    ED Course  I have reviewed the triage vital signs and the nursing notes.  Pertinent labs & imaging results that were available during my care of the patient were reviewed by me and considered in my medical decision making (see chart for details).    MDM Rules/Calculators/A&P                        Patient presenting for evaluation after fall and due to bradycardia.  Physical examination, patient appears nontoxic.  Heart rate between 45 and 55.  Patient without any complaints, although history of dementia.  As such, will obtain imaging of the head and the neck.  Will obtain chest x-ray and EKG.  POA reports multiple UTIs, they often cause increased falls.  As such, will obtain urine.  CT head and neck negative for acute findings including bleed or fracture.  Chest x-ray viewed interpreted by me, no pneumonia pneumothorax and effusion, cardiomegaly.  EKG shows bradycardia, but no sign of heart block or any concerning arrhythmia.  Case discussed with attending, Dr. Vanita Panda evaluated the patient.  Urine pending.  Urine shows trace leuks, 11-20 white cells, and many bacteria.  Could be early infection.  As son is reporting increased falls, will give a dose of fosfomycin and send urine culture.  Discussed findings and plan with patient son who is POA.  He is agreeable to discharge.  At this time, patient appears safe for discharge.  Return precautions given.  Final Clinical Impression(s) / ED Diagnoses Final diagnoses:  Fall, initial encounter  Bradycardia    Rx / DC Orders ED Discharge Orders    None       Franchot Heidelberg, PA-C 10/01/19 1347    Carmin Muskrat, MD 10/01/19 [phone]

## 2019-10-01 NOTE — Discharge Instructions (Signed)
Follow-up with your primary care doctor regarding your slightly low with a normal heart rate. You may receive a phone call about your urine culture, further antibiotic recommendations will be given at that time if needed. Continue to take your medications as prescribed. Return to emergency room with any new, worsening, concerning symptoms.

## 2019-10-03 LAB — URINE CULTURE: Culture: >=100000 — AB

## 2019-10-07 ENCOUNTER — Emergency Department (HOSPITAL_COMMUNITY): Payer: Medicare Other

## 2019-10-07 ENCOUNTER — Encounter (HOSPITAL_COMMUNITY): Payer: Self-pay

## 2019-10-07 ENCOUNTER — Emergency Department (HOSPITAL_COMMUNITY)
Admission: EM | Admit: 2019-10-07 | Discharge: 2019-10-08 | Disposition: A | Discharge status: Home / Self Care | Payer: Medicare Other | Attending: Emergency Medicine | Admitting: Emergency Medicine

## 2019-10-07 ENCOUNTER — Other Ambulatory Visit: Payer: Self-pay

## 2019-10-07 DIAGNOSIS — U071 COVID-19: Secondary | ICD-10-CM | POA: Insufficient documentation

## 2019-10-07 DIAGNOSIS — R4182 Altered mental status, unspecified: Secondary | ICD-10-CM

## 2019-10-07 DIAGNOSIS — F0390 Unspecified dementia without behavioral disturbance: Secondary | ICD-10-CM | POA: Insufficient documentation

## 2019-10-07 DIAGNOSIS — E039 Hypothyroidism, unspecified: Secondary | ICD-10-CM | POA: Diagnosis not present

## 2019-10-07 DIAGNOSIS — Z79899 Other long term (current) drug therapy: Secondary | ICD-10-CM | POA: Diagnosis not present

## 2019-10-07 LAB — POC SARS CORONAVIRUS 2 AG -  ED: SARS Coronavirus 2 Ag: NEGATIVE

## 2019-10-07 LAB — COMPREHENSIVE METABOLIC PANEL
ALT: 21 U/L (ref 0–44)
AST: 30 U/L (ref 15–41)
Albumin: 3.6 g/dL (ref 3.5–5.0)
Alkaline Phosphatase: 102 U/L (ref 38–126)
Anion gap: 10 (ref 5–15)
BUN: 23 mg/dL (ref 8–23)
CO2: 24 mmol/L (ref 22–32)
Calcium: 10.3 mg/dL (ref 8.9–10.3)
Chloride: 103 mmol/L (ref 98–111)
Creatinine, Ser: 1.29 mg/dL — ABNORMAL HIGH (ref 0.44–1.00)
GFR calc Af Amer: 43 mL/min — ABNORMAL LOW (ref >60)
GFR calc non Af Amer: 37 mL/min — ABNORMAL LOW (ref >60)
Glucose, Bld: 100 mg/dL — ABNORMAL HIGH (ref 70–99)
Potassium: 4.2 mmol/L (ref 3.5–5.1)
Sodium: 137 mmol/L (ref 135–145)
Total Bilirubin: 0.7 mg/dL (ref 0.3–1.2)
Total Protein: 7 g/dL (ref 6.5–8.1)

## 2019-10-07 LAB — URINALYSIS, ROUTINE W REFLEX MICROSCOPIC
Bacteria, UA: NONE SEEN
Bilirubin Urine: NEGATIVE
Glucose, UA: NEGATIVE mg/dL
Hgb urine dipstick: NEGATIVE
Ketones, ur: 5 mg/dL — AB
Leukocytes,Ua: NEGATIVE
Nitrite: NEGATIVE
Protein, ur: 100 mg/dL — AB
Specific Gravity, Urine: 1.024 (ref 1.005–1.030)
pH: 5 (ref 5.0–8.0)

## 2019-10-07 LAB — CBC WITH DIFFERENTIAL/PLATELET
Abs Immature Granulocytes: 0.01 10*3/uL (ref 0.00–0.07)
Basophils Absolute: 0 10*3/uL (ref 0.0–0.1)
Basophils Relative: 0 %
Eosinophils Absolute: 0.1 10*3/uL (ref 0.0–0.5)
Eosinophils Relative: 2 %
HCT: 44.1 % (ref 36.0–46.0)
Hemoglobin: 14.7 g/dL (ref 12.0–15.0)
Immature Granulocytes: 0 %
Lymphocytes Relative: 15 %
Lymphs Abs: 0.9 10*3/uL (ref 0.7–4.0)
MCH: 32.2 pg (ref 26.0–34.0)
MCHC: 33.3 g/dL (ref 30.0–36.0)
MCV: 96.5 fL (ref 80.0–100.0)
Monocytes Absolute: 0.5 10*3/uL (ref 0.1–1.0)
Monocytes Relative: 8 %
Neutro Abs: 4.6 10*3/uL (ref 1.7–7.7)
Neutrophils Relative %: 75 %
Platelets: 245 10*3/uL (ref 150–400)
RBC: 4.57 MIL/uL (ref 3.87–5.11)
RDW: 13 % (ref 11.5–15.5)
WBC: 6.2 10*3/uL (ref 4.0–10.5)
nRBC: 0 % (ref 0.0–0.2)

## 2019-10-07 LAB — RESPIRATORY PANEL BY RT PCR (FLU A&B, COVID)
Influenza A by PCR: NEGATIVE
Influenza B by PCR: NEGATIVE
SARS Coronavirus 2 by RT PCR: POSITIVE — AB

## 2019-10-07 MED ORDER — AZITHROMYCIN 250 MG PO TABS: 250.0000 mg | ORAL_TABLET | Freq: Every day | ORAL | 0 refills | Status: DC

## 2019-10-07 MED ORDER — DEXAMETHASONE SODIUM PHOSPHATE 10 MG/ML IJ SOLN
6.0000 mg | Freq: Once | INTRAMUSCULAR | Status: AC
  Administered 2019-10-07: 6 mg via INTRAVENOUS
  Filled 2019-10-07: qty 1

## 2019-10-07 MED ORDER — DEXAMETHASONE 6 MG PO TABS: 6.0000 mg | ORAL_TABLET | Freq: Every day | ORAL | 0 refills | Status: DC

## 2019-10-07 MED ORDER — AZITHROMYCIN 250 MG PO TABS
500.0000 mg | ORAL_TABLET | Freq: Once | ORAL | Status: AC
  Administered 2019-10-07: 500 mg via ORAL
  Filled 2019-10-07: qty 2

## 2019-10-07 MED ORDER — METOPROLOL TARTRATE 5 MG/5ML IV SOLN
2.5000 mg | Freq: Once | INTRAVENOUS | Status: AC
  Administered 2019-10-07: 2.5 mg via INTRAVENOUS
  Filled 2019-10-07: qty 5

## 2019-10-07 NOTE — ED Notes (Signed)
Pt's HC POA cleared to come to bedside by charge.

## 2019-10-07 NOTE — ED Notes (Signed)
Covid+  Ptar called for pt

## 2019-10-07 NOTE — ED Triage Notes (Signed)
Pt BIB [facility] from memory care unit at [facility]. Pt found unresponsive by staff, alert and oriented to person at baseline, currently alert, unable to follow commands. R facial droop at baseline. VS stable per EMS, CBG 117, NSR.

## 2019-10-07 NOTE — Discharge Instructions (Addendum)
I have placed orders in the computer and given you written orders for ambulatory referral to both hospice and palliative care.

## 2019-10-07 NOTE — ED Provider Notes (Signed)
[facility] EMERGENCY DEPARTMENT Provider Note   CSN: [id number] Arrival date & time: 10/07/19  1827     History Chief Complaint  Patient presents with  . Altered Mental Status    Julia Carey is a 85 y.o. female with a past medical history of frequent UTIs, dementia, who presents today for evaluation of a reported unresponsive episode.  History obtained from triage note and nursing home staff.  Nursing staff reports that patient was normal this morning, when it was last time she was slightly slowed however when they went to feed her dinner she was unresponsive even to a sternal rub.  They did check her oxygen and it was 88%, however they were unsure if that was accurate or due to the nail polish.  They report that they do have Covid present at the facility.  She was seen in the emergency room on 12/19 after a fall.  At that point she was reportedly diagnosed with a UTI.  Staff reports that she is taking Keflex.  Staff states that she has not had any fevers recently.   HPI     Past Medical History:  Diagnosis Date  . Age-related osteoporosis without current pathological fracture   . Dementia ([place])   . Dementia ([place])   . Dysphagia   . Fracture of first cervical vertebra (HCC)   . Hypothyroidism   . Insomnia   . Major depression, single episode   . Muscle weakness (generalized)     Patient Active Problem List   Diagnosis Date Noted  . Acute metabolic encephalopathy 0000000  . Acute lower UTI 02/19/2019  . Hypothyroidism 09/25/2018  . Dementia ([place]) 09/25/2018  . Fall at home, initial encounter 09/25/2018  . Subarachnoid hemorrhage following injury with brief loss of consciousness but without open intracranial wound ([place]) 09/25/2018  . Accelerated hypertension 09/25/2018  . Muscle weakness (generalized) 09/25/2018  . Subarachnoid hemorrhage ([place]) 09/25/2018    History reviewed. No pertinent surgical history.   OB History   No obstetric history  on file.     History reviewed. No pertinent family history.  Social History   Tobacco Use  . Smoking status: Unknown If Ever Smoked  . Smokeless tobacco: Never Used  Substance Use Topics  . Alcohol use: Not Currently  . Drug use: Not Currently    Home Medications Prior to Admission medications   Medication Sig Start Date End Date Taking? Authorizing Provider  acetaminophen (TYLENOL) 325 MG tablet Take 325 mg by mouth 3 (three) times daily.     [provider]  aspirin 81 MG chewable tablet Chew 81 mg by mouth daily.    [provider]  azithromycin (ZITHROMAX) 250 MG tablet Take 1 tablet (250 mg total) by mouth daily. 10/08/19   Lorin Glass, PA-C  Carboxymeth-Glycerin-Polysorb (REFRESH OPTIVE ADVANCED) 0.5-1-0.5 % SOLN Place 1 drop into both eyes 2 (two) times daily.     [provider]  cetirizine (ZYRTEC) 5 MG tablet Take 5 mg by mouth daily.    [provider]  dexamethasone (DECADRON) 6 MG tablet Take 1 tablet (6 mg total) by mouth daily. 10/08/19   Lorin Glass, PA-C  donepezil (ARICEPT) 10 MG tablet Take 10 mg by mouth at bedtime.     [provider]  DULoxetine (CYMBALTA) 30 MG capsule Take 30 mg by mouth daily.    [provider]  Eyelid Cleansers (OCUSOFT EYELID CLEANSING EX) Place 1 application into both eyes daily.  [provider]  fluticasone (FLONASE) 50 MCG/ACT nasal spray Place 1 spray into both nostrils daily.    [provider]  levothyroxine (SYNTHROID) 100 MCG tablet Take 1 tablet (100 mcg total) by mouth daily for 30 days. 02/21/19 04/17/19  Aline August, MD  levothyroxine (SYNTHROID) 112 MCG tablet Take 112 mcg by mouth daily before breakfast.    [provider]  losartan (COZAAR) 100 MG tablet Take 1 tablet (100 mg total) by mouth daily for 30 days. 02/21/19 10/01/19  Aline August, MD  Melatonin 3 MG TABS Take 3 mg by mouth at bedtime.    [provider]   metoprolol tartrate (LOPRESSOR) 25 MG tablet Take 25 mg by mouth every morning.     [provider]  polyethylene glycol (MIRALAX / GLYCOLAX) packet Take 17 g by mouth daily. MIX INTO 8 OUNCES OF LIQUID    [provider]  PROTEIN PO Take 1 Bottle by mouth 3 (three) times daily after meals.    [provider]  vitamin B-12 (CYANOCOBALAMIN) 1000 MCG tablet Take 1,000 mcg by mouth every other day.     [provider]    Allergies    Amoxicillin, Clindamycin/lincomycin, and Keflex [cephalexin]  Review of Systems   Review of Systems  Unable to perform ROS: Dementia    Physical Exam Updated Vital Signs BP (!) 213/83   Pulse 66   Temp 97.7 F (36.5 C) (Oral)   Resp 14   SpO2 98%   Physical Exam Vitals and nursing note reviewed.  Constitutional:      General: She is not in acute distress.    Appearance: She is well-developed.  HENT:     Head: Normocephalic and atraumatic.  Eyes:     General: No scleral icterus.       Right eye: No discharge.        Left eye: No discharge.     Conjunctiva/sclera: Conjunctivae normal.  Cardiovascular:     Rate and Rhythm: Normal rate and regular rhythm.     Pulses: Normal pulses.  Pulmonary:     Effort: Pulmonary effort is normal. No respiratory distress.     Breath sounds: No stridor.  Abdominal:     General: There is no distension.     Tenderness: There is no abdominal tenderness.  Musculoskeletal:        General: No deformity.     Cervical back: Normal range of motion.     Right lower leg: No edema.     Left lower leg: No edema.  Skin:    General: Skin is warm and dry.  Neurological:     Mental Status: She is alert.     Motor: No abnormal muscle tone.     Comments: Patient awakens to loud voice.  She is able to tell me her name.  When asked where she usually responds with "right here."  She is unsure what year it is.  She has right-sided facial droop which is not new.  Her speech is not slurred.   She will say a few words and then fall back asleep.  Psychiatric:        Behavior: Behavior normal.     ED Results / Procedures / Treatments   Labs (all labs ordered are listed, but only abnormal results are displayed) Labs Reviewed  RESPIRATORY PANEL BY RT PCR (FLU A&B, COVID) - Abnormal; Notable for the following components:      Result Value   SARS Coronavirus  2 by RT PCR POSITIVE (*)    All other components within normal limits  COMPREHENSIVE METABOLIC PANEL - Abnormal; Notable for the following components:   Glucose, Bld 100 (*)    Creatinine, Ser 1.29 (*)    GFR calc non Af Amer 37 (*)    GFR calc Af Amer 43 (*)    All other components within normal limits  URINALYSIS, ROUTINE W REFLEX MICROSCOPIC - Abnormal; Notable for the following components:   APPearance HAZY (*)    Ketones, ur 5 (*)    Protein, ur 100 (*)    All other components within normal limits  URINE CULTURE  CBC WITH DIFFERENTIAL/PLATELET  POC SARS CORONAVIRUS 2 AG -  ED    EKG EKG Interpretation  Date/Time:  Friday October 07 2019 19:09:02 EST Ventricular Rate:  68 PR Interval:    QRS Duration: 100 QT Interval:  456 QTC Calculation: 485 R Axis:   6 Text Interpretation: Sinus rhythm Repol abnrm suggests ischemia, lateral leads Since last tracing rate faster Confirmed by Isla Pence [phone]) on 10/07/2019 7:27:24 PM   Radiology CT Head Wo Contrast  Result Date: 10/07/2019 CLINICAL DATA:  85 year old female with altered mental status. EXAM: CT HEAD WITHOUT CONTRAST TECHNIQUE: Contiguous axial images were obtained from the base of the skull through the vertex without intravenous contrast. COMPARISON:  Head CT dated 10/01/2019. FINDINGS: Brain: Moderate age-related atrophy and chronic microvascular ischemic changes. Small focus of white matter hypodensity in right corona radiata similar to prior CT, chronic. There is no acute intracranial hemorrhage. No mass effect or midline shift. No extra-axial  fluid collection. Vascular: No hyperdense vessel or unexpected calcification. Skull: Normal. Negative for fracture or focal lesion. Sinuses/Orbits: No acute finding. Other: None IMPRESSION: 1. No acute intracranial hemorrhage. 2. Moderate age-related atrophy and chronic microvascular ischemic changes. Electronically Signed   By: Anner Crete M.D.   On: 10/07/2019 20:30   DG Chest Port 1 View  Result Date: 10/07/2019 CLINICAL DATA:  Patient found unresponsive. Altered mental status. EXAM: PORTABLE CHEST 1 VIEW COMPARISON:  Two-view chest x-ray 10/01/2019 FINDINGS: The heart is enlarged. Atherosclerotic changes are present at the aortic arch. Patchy airspace opacities are new since prior study, predominantly at the lung bases. There is some disease at the right upper lobe as well. IMPRESSION: 1. Patchy airspace disease at both lung bases and right upper lobe. Findings are new over the last 6 days and concerning for infection. 2. Cardiomegaly without failure. 3. Aortic atherosclerosis. Electronically Signed   By: San Morelle M.D.   On: 10/07/2019 19:35    Procedures .Critical Care Performed by: Lorin Glass, PA-C Authorized by: Lorin Glass, PA-C   Critical care provider statement:    Critical care time (minutes):  60   Critical care was time spent personally by me on the following activities:  Discussions with consultants, evaluation of patient's response to treatment, examination of patient, ordering and performing treatments and interventions, ordering and review of laboratory studies, ordering and review of radiographic studies, pulse oximetry, re-evaluation of patient's condition, obtaining history from patient or surrogate and review of old charts Comments:     Critical care time was spent in multiple discussions with patient's healthcare power of attorney, her residential facility, regarding treatment options and goals of care.  Multiple referrals for outpatient  evaluation were placed.     (including critical care time)  Medications Ordered in ED Medications  metoprolol tartrate (LOPRESSOR) injection 2.5 mg (2.5 mg Intravenous Given  10/07/19 2111)  dexamethasone (DECADRON) injection 6 mg (6 mg Intravenous Given 10/07/19 2339)  azithromycin (ZITHROMAX) tablet 500 mg (500 mg Oral Given 10/07/19 2338)    ED Course  I have reviewed the triage vital signs and the nursing notes.  Pertinent labs & imaging results that were available during my care of the patient were reviewed by me and considered in my medical decision making (see chart for details).  Clinical Course as of Oct 06 2346  Fri Oct 07, 2019  1855 Spoke with [place] from SNF. Reports that this morning she was talkative, Went to lunch, was ok, questionably a little bit slow.  When they went to bring her dinner she was unresponsive.  She did not respond to sternal rub.  She states that when they checked her oxygen it was 88% however unsure if that may have been from the nail polish.  She reportedly woke at the most when EMS transferred her.  They do have reported Covid cases at the facility.  She is being treated with Keflex for UTI.  No recent fevers. Normally is only really oriented to person not so much to place or time.  No evidence that she fell today   [EH]  2144 SARS Coronavirus 2 by RT PCR(!): POSITIVE [EH]  2206 I spoke with patient's facility.  They report that they are able to assist patient with eating, hygiene, and toileting activities.  The request that if she gets discharged she gets discharged with all medications needed, and that I discussed placing a palliative consult with her nephew.     [EH]  2231 I had an extensive discussion with patient's nephew, who is healthcare power of attorney, about oxygen, with the facility that she is at can provide and about the role of oxygen in Covid for someone who does not want interventions that are aggressive.  He states that given the  situation after further discussion he feels like she would not want oxygen if it would be only prolonging her illness and wishes for her to go back to [facility].    [EH]    Clinical Course User Index [EH] Ollen Gross   MDM Rules/Calculators/A&P                     Patient presents today for evaluation of altered mental status.  According to staff at the facility when they went to give her dinner she was fully unresponsive.  Reportedly at baseline she is oriented to person, and place and is very talkative and interactive.  On my initial exam she was oriented to person, when asked about why she stated "I am right here."  She was very somnolent and would wake up, answer few words and fall back asleep.  CT head was obtained given her recent fall, for which she was evaluated in the ER for, without evidence of delayed hemorrhage or other abnormalities to explain her symptoms.  Covid antigen test was negative, however PCR Covid test was positive and staff reports there are multiple Covid patients at the facility.  Chest x-ray shows concern for diffuse pneumonia, concerning for coronavirus.  CMP obtained, appears consistent with her baseline.  CBC does not show a significant leukocytosis and again is consistent with her baseline.    UA without evidence of infection,, however will be sent for culture given patient's dementia and inability to clearly describe symptoms.  She was treated with IV Decadron, p.o. azithromycin and her blood pressure was  treated with IV Lopressor while in the department.  I had extensive conversations with her nephew, Julia Carey, who is her healthcare power of attorney.  Patient has a DNR form at bedside, however we discussed much more in depth wishes and goals of care.  He states that patient would not want any aggressive interventions, including no CPR, intubation, no BiPAP or requiring use of a mask, no central line or high flow oxygen.  I spoke multiple times with  her facility to determine their capabilities to care for her along with determining appropriate disposition.  Patient will be discharged back to her facility.  She is not currently hypoxic, and given her wishes for limited treatment do not feel that a hospitalization would significantly alter her course with the limitations of her wishes.  I discussed the facility capabilities with Rip Harbour, resident coordinator at the facility.  Limitations include not frequently checking vitals, and no oxygen available for patient tonight, that if she were to be noted to be hypoxic she may require return to the ER if wish for treatment.   I discussed with Julia Carey, her POA, these capabilities and limitations, along with probable course of her illness.    Decision was made for patient to go back to her facility by POA.    She will be discharged with rx for decadron, and azithromycin.  Per facility request she is given both electronic and paper referrals to both hospice and palliative care to further assist with providing care in the scope of patient's wishes.  Patient was seen as a shared visit with Dr. Gilford Raid.    Patient discharged back to her facility.   Final Clinical Impression(s) / ED Diagnoses Final diagnoses:  COVID-19  Altered mental status, unspecified altered mental status type    Rx / DC Orders ED Discharge Orders         Ordered    azithromycin (ZITHROMAX) 250 MG tablet  Daily     10/07/19 2301    dexamethasone (DECADRON) 6 MG tablet  Daily     10/07/19 2301    Amb Referral to Palliative Care    Comments: Evaluation and treatment   10/07/19 2318    Ambulatory referral to Hospice    Comments: Evaluation and treatment   10/07/19 2318           Lorin Glass, PA-C 10/08/19 0000    Isla Pence, MD 10/08/19 2307

## 2019-10-08 LAB — URINE CULTURE: Culture: NO GROWTH

## 2019-10-28 ENCOUNTER — Non-Acute Institutional Stay: Payer: Medicare Other | Admitting: Hospice

## 2019-10-28 ENCOUNTER — Other Ambulatory Visit: Payer: Self-pay

## 2019-10-28 DIAGNOSIS — Z515 Encounter for palliative care: Secondary | ICD-10-CM

## 2019-10-28 DIAGNOSIS — F0391 Unspecified dementia with behavioral disturbance: Secondary | ICD-10-CM

## 2019-10-28 NOTE — Progress Notes (Signed)
[facility] Consult Note Telephone: [phone]  Fax: [phone]  PATIENT NAME: Julia Carey DOB: 02/01/1928 MRN: [id number]  PRIMARY CARE PROVIDER:  Dr. Reymundo Poll REFERRING PROVIDER:  Dr. Reymundo Poll Emergency contact: Alanson Puls: Sabino Niemann [phone]   RECOMMENDATIONS/PLAN:   Advance Care Planning/Goals of Care: Visit consisted of building trust and discussions on Palliative Medicine as specialized medical care for people living with serious illness, aimed at facilitating better quality of life through symptoms relief, assisting with advance care plan and establishing goals of care. Patient with limited cognition and communication. Called Julia Carey and updated on Palliative care/visit. Julia Carey expressed thanks. Patient is a DNR. Goals of care include to maximize quality of life and symptom management. MOST selections include limited interventions and  antibiotics/IV fluids as needed.  Symptom management: Memory loss related to Dementia, FAST 7a, incontinent of bowel and bladder, with limited speech. She continues on Aricept and Duloxetine. Patient on mechanical soft diet for Dysphagia; choking precautions in place.  Patient denied pain/discomfort; nursing staff with no concerns. Encouraged ongoing nursing support. Follow up: Palliative care will continue to follow patient for goals of care clarification and symptom management. I spent 60  minutes providing this consultation. Time included chart review and documentation. More than 50% of the time in this consultation was spent on coordinating communication  HISTORY OF PRESENT ILLNESS:  Julia Carey is a 86 y.o. year old female with multiple medical problems including Dementia, Depression, Hypothyroidism. Palliative Care was asked to help address goals of care.   CODE STATUS: DNR  PPS: weak 40% HOSPICE ELIGIBILITY/DIAGNOSIS: TBD  PAST MEDICAL HISTORY:  Past Medical History:  Diagnosis  Date  . Age-related osteoporosis without current pathological fracture   . Dementia ([place])   . Dementia ([place])   . Dysphagia   . Fracture of first cervical vertebra (HCC)   . Hypothyroidism   . Insomnia   . Major depression, single episode   . Muscle weakness (generalized)     SOCIAL HX:  Social History   Tobacco Use  . Smoking status: Unknown If Ever Smoked  . Smokeless tobacco: Never Used  Substance Use Topics  . Alcohol use: Not Currently    ALLERGIES:  Allergies  Allergen Reactions  . Amoxicillin Hives    "Allergic," per MAR: Did it involve swelling of the face/tongue/throat, SOB, or low BP? Unk Did it involve sudden or severe rash/hives, skin peeling, or any reaction on the inside of your mouth or nose? Unk Did you need to seek medical attention at a hospital or doctor's office? Unk When did it last happen? Unk If all above answers are "NO", may proceed with cephalosporin use.   . Clindamycin/Lincomycin Hives    "Allergic, " per MAR  . Keflex [Cephalexin] Hives    "Allergic, " per [facility]     PERTINENT MEDICATIONS:  Outpatient Encounter Medications as of 10/28/2019  Medication Sig  . acetaminophen (TYLENOL) 325 MG tablet Take 325 mg by mouth 3 (three) times daily.   Marland Kitchen aspirin 81 MG chewable tablet Chew 81 mg by mouth daily.  Marland Kitchen azithromycin (ZITHROMAX) 250 MG tablet Take 1 tablet (250 mg total) by mouth daily.  . Carboxymeth-Glycerin-Polysorb (REFRESH OPTIVE ADVANCED) 0.5-1-0.5 % SOLN Place 1 drop into both eyes 2 (two) times daily.   . cetirizine (ZYRTEC) 5 MG tablet Take 5 mg by mouth daily.  Marland Kitchen dexamethasone (DECADRON) 6 MG tablet Take 1 tablet (6 mg total) by mouth daily.  Marland Kitchen  donepezil (ARICEPT) 10 MG tablet Take 10 mg by mouth at bedtime.   . DULoxetine (CYMBALTA) 30 MG capsule Take 30 mg by mouth daily.  . Eyelid Cleansers (OCUSOFT EYELID CLEANSING EX) Place 1 application into both eyes daily.   . fluticasone (FLONASE) 50 MCG/ACT nasal spray Place 1 spray into both  nostrils daily.  Marland Kitchen levothyroxine (SYNTHROID) 100 MCG tablet Take 1 tablet (100 mcg total) by mouth daily for 30 days.  Marland Kitchen levothyroxine (SYNTHROID) 112 MCG tablet Take 112 mcg by mouth daily before breakfast.  . losartan (COZAAR) 100 MG tablet Take 1 tablet (100 mg total) by mouth daily for 30 days.  . Melatonin 3 MG TABS Take 3 mg by mouth at bedtime.  . metoprolol tartrate (LOPRESSOR) 25 MG tablet Take 25 mg by mouth every morning.   . polyethylene glycol (MIRALAX / GLYCOLAX) packet Take 17 g by mouth daily. MIX INTO 8 OUNCES OF LIQUID  . PROTEIN PO Take 1 Bottle by mouth 3 (three) times daily after meals.  . vitamin B-12 (CYANOCOBALAMIN) 1000 MCG tablet Take 1,000 mcg by mouth every other day.    No facility-administered encounter medications on file as of 10/28/2019.    PHYSICAL EXAM/ROS:   General: NAD Cardiovascular:denies chest pain Pulmonary:normal respiratory effort Extremities: no edema Skin: no rashes to exposed skin Neurological: Weakness but otherwise nonfocal  Teodoro Spray, NP

## 2019-11-02 ENCOUNTER — Encounter (HOSPITAL_COMMUNITY): Payer: Self-pay

## 2019-11-02 ENCOUNTER — Emergency Department (HOSPITAL_COMMUNITY)
Admission: EM | Admit: 2019-11-02 | Discharge: 2019-11-02 | Disposition: A | Discharge status: Home / Self Care | Attending: Emergency Medicine | Admitting: Emergency Medicine

## 2019-11-02 ENCOUNTER — Other Ambulatory Visit: Payer: Self-pay

## 2019-11-02 ENCOUNTER — Emergency Department (HOSPITAL_COMMUNITY)

## 2019-11-02 DIAGNOSIS — J948 Other specified pleural conditions: Secondary | ICD-10-CM | POA: Insufficient documentation

## 2019-11-02 DIAGNOSIS — S0121XA Laceration without foreign body of nose, initial encounter: Secondary | ICD-10-CM | POA: Diagnosis not present

## 2019-11-02 DIAGNOSIS — X58XXXA Exposure to other specified factors, initial encounter: Secondary | ICD-10-CM | POA: Insufficient documentation

## 2019-11-02 DIAGNOSIS — F0390 Unspecified dementia without behavioral disturbance: Secondary | ICD-10-CM | POA: Insufficient documentation

## 2019-11-02 DIAGNOSIS — S0031XA Abrasion of nose, initial encounter: Secondary | ICD-10-CM

## 2019-11-02 DIAGNOSIS — S0990XA Unspecified injury of head, initial encounter: Secondary | ICD-10-CM | POA: Diagnosis not present

## 2019-11-02 DIAGNOSIS — Y92122 Bedroom in nursing home as the place of occurrence of the external cause: Secondary | ICD-10-CM | POA: Diagnosis not present

## 2019-11-02 DIAGNOSIS — Y939 Activity, unspecified: Secondary | ICD-10-CM | POA: Insufficient documentation

## 2019-11-02 DIAGNOSIS — Y999 Unspecified external cause status: Secondary | ICD-10-CM | POA: Insufficient documentation

## 2019-11-02 DIAGNOSIS — E039 Hypothyroidism, unspecified: Secondary | ICD-10-CM | POA: Insufficient documentation

## 2019-11-02 DIAGNOSIS — Z79899 Other long term (current) drug therapy: Secondary | ICD-10-CM | POA: Diagnosis not present

## 2019-11-02 DIAGNOSIS — W19XXXA Unspecified fall, initial encounter: Secondary | ICD-10-CM

## 2019-11-02 DIAGNOSIS — Z7982 Long term (current) use of aspirin: Secondary | ICD-10-CM | POA: Diagnosis not present

## 2019-11-02 NOTE — Discharge Instructions (Addendum)
You were seen in the emergency department for evaluation of injuries from a fall.  You have a CAT scan of your head cervical spine and face.  They did see evidence of a nasal fracture.  This will heal on its own.  They also saw some thickening of the upper part of the lungs that will need to be followed up by your primary care doctor.  6. Biapical pleural thickening with nodularity below the right  apical pleural thickening in the right upper lobe not well seen on  the previous exam. Recommend noncontrast chest CT for further  evaluation on an elective basis.

## 2019-11-02 NOTE — ED Triage Notes (Signed)
EMS reports from [facility], unwitnessed fall from bed, fall to face, small laceration to bridge of nose and abrasion to left forearm. No blood thinners, Hx of dementia. Pt tested Positive Covid approx 3 weeks ago, Pt does not present with any outward signs or symptoms currently.  BP 138/70 HR 80 RR 16 Sp02 99 RA  CBG 166 Temp 97.9

## 2019-11-02 NOTE — ED Provider Notes (Signed)
[facility] DEPT Provider Note   CSN: [id number] Arrival date & time: 11/02/19  [id number]     History No chief complaint on file.   Julia Carey is a 86 y.o. female.  Level 5 caveat secondary to dementia.  Patient is a resident at [facility].  She was found crawling out of her room after a likely fall.  She has abrasions and ecchymosis of her nose and some dried blood around her nares.  She is unable to provide any history secondary to her dementia.  C-collar was placed by EMS prior to transfer.  Reportedly was Covid positive last month.  No reported symptoms.  The history is provided by the EMS personnel.  Fall This is a recurrent problem. The current episode started less than 1 hour ago. The problem has not changed since onset.Nothing aggravates the symptoms. Nothing relieves the symptoms. She has tried nothing for the symptoms. The treatment provided no relief.       Past Medical History:  Diagnosis Date  . Age-related osteoporosis without current pathological fracture   . Dementia ([place])   . Dementia ([place])   . Dysphagia   . Fracture of first cervical vertebra (H[facility])   . Hypothyroidism   . Insomnia   . Major depression, single episode   . Muscle weakness (generalized)     Patient Active Problem List   Diagnosis Date Noted  . Acute metabolic encephalopathy 0000000  . Acute lower UTI 02/19/2019  . Hypothyroidism 09/25/2018  . Dementia ([place]) 09/25/2018  . Fall at home, initial encounter 09/25/2018  . Subarachnoid hemorrhage following injury with brief loss of consciousness but without open intracranial wound ([place]) 09/25/2018  . Accelerated hypertension 09/25/2018  . Muscle weakness (generalized) 09/25/2018  . Subarachnoid hemorrhage ([place]) 09/25/2018    No past surgical history on file.   OB History   No obstetric history on file.     No family history on file.  Social History   Tobacco Use  . Smoking status: Unknown If Ever  Smoked  . Smokeless tobacco: Never Used  Substance Use Topics  . Alcohol use: Not Currently  . Drug use: Not Currently    Home Medications Prior to Admission medications   Medication Sig Start Date End Date Taking? Authorizing Provider  acetaminophen (TYLENOL) 325 MG tablet Take 325 mg by mouth 3 (three) times daily.     [provider]  aspirin 81 MG chewable tablet Chew 81 mg by mouth daily.    [provider]  azithromycin (ZITHROMAX) 250 MG tablet Take 1 tablet (250 mg total) by mouth daily. 10/08/19   Lorin Glass, PA-C  Carboxymeth-Glycerin-Polysorb (REFRESH OPTIVE ADVANCED) 0.5-1-0.5 % SOLN Place 1 drop into both eyes 2 (two) times daily.     [provider]  cetirizine (ZYRTEC) 5 MG tablet Take 5 mg by mouth daily.    [provider]  dexamethasone (DECADRON) 6 MG tablet Take 1 tablet (6 mg total) by mouth daily. 10/08/19   Lorin Glass, PA-C  donepezil (ARICEPT) 10 MG tablet Take 10 mg by mouth at bedtime.     [provider]  DULoxetine (CYMBALTA) 30 MG capsule Take 30 mg by mouth daily.    [provider]  Eyelid Cleansers (OCUSOFT EYELID CLEANSING EX) Place 1 application into both eyes daily.     [provider]  fluticasone (FLONASE) 50 MCG/ACT nasal spray Place 1 spray into both nostrils daily.    [provider]  levothyroxine (SYNTHROID) 100 MCG tablet Take 1 tablet (100 mcg total) by mouth daily for 30 days. 02/21/19 04/17/19  Aline August, MD  levothyroxine (SYNTHROID) 112 MCG tablet Take 112 mcg by mouth daily before breakfast.    [provider]  losartan (COZAAR) 100 MG tablet Take 1 tablet (100 mg total) by mouth daily for 30 days. 02/21/19 10/01/19  Aline August, MD  Melatonin 3 MG TABS Take 3 mg by mouth at bedtime.    [provider]  metoprolol tartrate (LOPRESSOR) 25 MG tablet Take 25 mg by mouth every morning.     [provider]  polyethylene glycol  (MIRALAX / GLYCOLAX) packet Take 17 g by mouth daily. MIX INTO 8 OUNCES OF LIQUID    [provider]  PROTEIN PO Take 1 Bottle by mouth 3 (three) times daily after meals.    [provider]  vitamin B-12 (CYANOCOBALAMIN) 1000 MCG tablet Take 1,000 mcg by mouth every other day.     [provider]    Allergies    Amoxicillin, Clindamycin/lincomycin, and Keflex [cephalexin]  Review of Systems   Review of Systems  Unable to perform ROS: Dementia    Physical Exam Updated Vital Signs BP (!) 174/70   Pulse 66   Temp 97.8 F (36.6 C) (Oral)   Resp 16   SpO2 97%   Physical Exam Vitals and nursing note reviewed.  Constitutional:      General: She is not in acute distress.    Appearance: She is well-developed.  HENT:     Head: Normocephalic and atraumatic.     Nose:     Comments: She has ecchymosis over the bridge of her nose with some minor abrasions.  There are some dried blood at her nares.  No active bleeding. Eyes:     Conjunctiva/sclera: Conjunctivae normal.  Cardiovascular:     Rate and Rhythm: Normal rate and regular rhythm.     Heart sounds: No murmur.  Pulmonary:     Effort: Pulmonary effort is normal. No respiratory distress.     Breath sounds: Normal breath sounds.  Abdominal:     Palpations: Abdomen is soft.     Tenderness: There is no abdominal tenderness.  Musculoskeletal:        General: No deformity. Normal range of motion.     Cervical back: Neck supple.  Skin:    General: Skin is warm and dry.  Neurological:     General: No focal deficit present.     Mental Status: She is alert. Mental status is at baseline.     ED Results / Procedures / Treatments   Labs (all labs ordered are listed, but only abnormal results are displayed) Labs Reviewed - No data to display  EKG None  Radiology CT Head Wo Contrast  Result Date: 11/02/2019 CLINICAL DATA:  Unwitnessed fall from bed. EXAM: CT HEAD WITHOUT CONTRAST CT MAXILLOFACIAL  WITHOUT CONTRAST CT CERVICAL SPINE WITHOUT CONTRAST TECHNIQUE: Multidetector CT imaging of the head, cervical spine, and maxillofacial structures were performed using the standard protocol without intravenous contrast. Multiplanar CT image reconstructions of the cervical spine and maxillofacial structures were also generated. COMPARISON:  Head CT 10/07/2019 and CT cervical spine 10/01/2019, 04/17/2019 FINDINGS: CT HEAD FINDINGS Brain: Ventricles, cisterns and other CSF spaces are within normal relative to patient's age as there is mild age related atrophy. There is mild to moderate chronic ischemic microvascular disease. No mass, mass effect, shift of midline structures or acute hemorrhage. No evidence  of acute infarction. Old lacunar infarct over the right periventricular white matter. Small stable lipoma over the quadrigeminal plate cistern. Vascular: No hyperdense vessel or unexpected calcification. Skull: Normal. Negative for fracture or focal lesion. Other: Small air-fluid levels over the maxillary sinuses bilaterally. CT MAXILLOFACIAL FINDINGS Osseous: Subtle fracture along the left side of the nasal bone. No additional facial bone fractures visualized. Orbits: Negative. No traumatic or inflammatory finding. Sinuses: Paranasal sinuses are well developed and well aerated. Small symmetric air-fluid is present over the maxillary sinuses. Remaining paranasal sinuses are clear. Mild deviation of the nasal septum to the left. Mastoid air cells are clear. Soft tissues: Unremarkable. CT CERVICAL SPINE FINDINGS Alignment: Curvature of the cervical spine convex right unchanged. Moderate reversal of the normal cervical lordosis unchanged. Skull base and vertebrae: Chronic stable changes over the skull base and C1-2 articulation. Vertebral body heights are maintained. Moderate spondylosis throughout the cervical spine to include moderate uncovertebral joint spurring and facet arthropathy. No acute fracture. Significant  neural foraminal narrowing at several levels due to adjacent bony spurring. Soft tissues and spinal canal: No prevertebral fluid or swelling. No visible canal hematoma. Disc levels: Moderate disc space narrowing at the C4-5, C5-6 and C6-7 levels unchanged. Upper chest: No acute findings. Biapical pleural thickening with nodularity below the pleural thickening not well visualized on the prior exam. Other: None. IMPRESSION: 1.  No acute brain injury. 2. Moderate chronic ischemic microvascular disease and age related atrophic change. 3. Subtle left-sided nasal bone fracture. No additional facial bone fractures. Small air-fluid levels over the maxillary sinuses which may be hemorrhagic debris. 4.  No acute cervical spine injury. 5. Moderate spondylosis of the cervical spine with multilevel disc disease and mild multilevel neural foraminal narrowing unchanged. Moderate curvature of the spine as described unchanged. Chronic stable changes at the C1-2 articulation. 6. Biapical pleural thickening with nodularity below the right apical pleural thickening in the right upper lobe not well seen on the previous exam. Recommend noncontrast chest CT for further evaluation on an elective basis. Electronically Signed   By: Marin Olp M.D.   On: 11/02/2019 11:19   CT Cervical Spine Wo Contrast  Result Date: 11/02/2019 CLINICAL DATA:  Unwitnessed fall from bed. EXAM: CT HEAD WITHOUT CONTRAST CT MAXILLOFACIAL WITHOUT CONTRAST CT CERVICAL SPINE WITHOUT CONTRAST TECHNIQUE: Multidetector CT imaging of the head, cervical spine, and maxillofacial structures were performed using the standard protocol without intravenous contrast. Multiplanar CT image reconstructions of the cervical spine and maxillofacial structures were also generated. COMPARISON:  Head CT 10/07/2019 and CT cervical spine 10/01/2019, 04/17/2019 FINDINGS: CT HEAD FINDINGS Brain: Ventricles, cisterns and other CSF spaces are within normal relative to patient's age as  there is mild age related atrophy. There is mild to moderate chronic ischemic microvascular disease. No mass, mass effect, shift of midline structures or acute hemorrhage. No evidence of acute infarction. Old lacunar infarct over the right periventricular white matter. Small stable lipoma over the quadrigeminal plate cistern. Vascular: No hyperdense vessel or unexpected calcification. Skull: Normal. Negative for fracture or focal lesion. Other: Small air-fluid levels over the maxillary sinuses bilaterally. CT MAXILLOFACIAL FINDINGS Osseous: Subtle fracture along the left side of the nasal bone. No additional facial bone fractures visualized. Orbits: Negative. No traumatic or inflammatory finding. Sinuses: Paranasal sinuses are well developed and well aerated. Small symmetric air-fluid is present over the maxillary sinuses. Remaining paranasal sinuses are clear. Mild deviation of the nasal septum to the left. Mastoid air cells are clear. Soft tissues: Unremarkable.  CT CERVICAL SPINE FINDINGS Alignment: Curvature of the cervical spine convex right unchanged. Moderate reversal of the normal cervical lordosis unchanged. Skull base and vertebrae: Chronic stable changes over the skull base and C1-2 articulation. Vertebral body heights are maintained. Moderate spondylosis throughout the cervical spine to include moderate uncovertebral joint spurring and facet arthropathy. No acute fracture. Significant neural foraminal narrowing at several levels due to adjacent bony spurring. Soft tissues and spinal canal: No prevertebral fluid or swelling. No visible canal hematoma. Disc levels: Moderate disc space narrowing at the C4-5, C5-6 and C6-7 levels unchanged. Upper chest: No acute findings. Biapical pleural thickening with nodularity below the pleural thickening not well visualized on the prior exam. Other: None. IMPRESSION: 1.  No acute brain injury. 2. Moderate chronic ischemic microvascular disease and age related atrophic  change. 3. Subtle left-sided nasal bone fracture. No additional facial bone fractures. Small air-fluid levels over the maxillary sinuses which may be hemorrhagic debris. 4.  No acute cervical spine injury. 5. Moderate spondylosis of the cervical spine with multilevel disc disease and mild multilevel neural foraminal narrowing unchanged. Moderate curvature of the spine as described unchanged. Chronic stable changes at the C1-2 articulation. 6. Biapical pleural thickening with nodularity below the right apical pleural thickening in the right upper lobe not well seen on the previous exam. Recommend noncontrast chest CT for further evaluation on an elective basis. Electronically Signed   By: Marin Olp M.D.   On: 11/02/2019 11:19   CT Maxillofacial WO CM  Result Date: 11/02/2019 CLINICAL DATA:  Unwitnessed fall from bed. EXAM: CT HEAD WITHOUT CONTRAST CT MAXILLOFACIAL WITHOUT CONTRAST CT CERVICAL SPINE WITHOUT CONTRAST TECHNIQUE: Multidetector CT imaging of the head, cervical spine, and maxillofacial structures were performed using the standard protocol without intravenous contrast. Multiplanar CT image reconstructions of the cervical spine and maxillofacial structures were also generated. COMPARISON:  Head CT 10/07/2019 and CT cervical spine 10/01/2019, 04/17/2019 FINDINGS: CT HEAD FINDINGS Brain: Ventricles, cisterns and other CSF spaces are within normal relative to patient's age as there is mild age related atrophy. There is mild to moderate chronic ischemic microvascular disease. No mass, mass effect, shift of midline structures or acute hemorrhage. No evidence of acute infarction. Old lacunar infarct over the right periventricular white matter. Small stable lipoma over the quadrigeminal plate cistern. Vascular: No hyperdense vessel or unexpected calcification. Skull: Normal. Negative for fracture or focal lesion. Other: Small air-fluid levels over the maxillary sinuses bilaterally. CT MAXILLOFACIAL FINDINGS  Osseous: Subtle fracture along the left side of the nasal bone. No additional facial bone fractures visualized. Orbits: Negative. No traumatic or inflammatory finding. Sinuses: Paranasal sinuses are well developed and well aerated. Small symmetric air-fluid is present over the maxillary sinuses. Remaining paranasal sinuses are clear. Mild deviation of the nasal septum to the left. Mastoid air cells are clear. Soft tissues: Unremarkable. CT CERVICAL SPINE FINDINGS Alignment: Curvature of the cervical spine convex right unchanged. Moderate reversal of the normal cervical lordosis unchanged. Skull base and vertebrae: Chronic stable changes over the skull base and C1-2 articulation. Vertebral body heights are maintained. Moderate spondylosis throughout the cervical spine to include moderate uncovertebral joint spurring and facet arthropathy. No acute fracture. Significant neural foraminal narrowing at several levels due to adjacent bony spurring. Soft tissues and spinal canal: No prevertebral fluid or swelling. No visible canal hematoma. Disc levels: Moderate disc space narrowing at the C4-5, C5-6 and C6-7 levels unchanged. Upper chest: No acute findings. Biapical pleural thickening with nodularity below the pleural thickening  not well visualized on the prior exam. Other: None. IMPRESSION: 1.  No acute brain injury. 2. Moderate chronic ischemic microvascular disease and age related atrophic change. 3. Subtle left-sided nasal bone fracture. No additional facial bone fractures. Small air-fluid levels over the maxillary sinuses which may be hemorrhagic debris. 4.  No acute cervical spine injury. 5. Moderate spondylosis of the cervical spine with multilevel disc disease and mild multilevel neural foraminal narrowing unchanged. Moderate curvature of the spine as described unchanged. Chronic stable changes at the C1-2 articulation. 6. Biapical pleural thickening with nodularity below the right apical pleural thickening in the  right upper lobe not well seen on the previous exam. Recommend noncontrast chest CT for further evaluation on an elective basis. Electronically Signed   By: Marin Olp M.D.   On: 11/02/2019 11:19    Procedures Procedures (including critical care time)  Medications Ordered in ED Medications - No data to display  ED Course  I have reviewed the triage vital signs and the nursing notes.  Pertinent labs & imaging results that were available during my care of the patient were reviewed by me and considered in my medical decision making (see chart for details).  Clinical Course as of Nov 02 1711  Wed Nov 01, 1182  5428 86 year old female here after unwitnessed fall.  Patient was here last month for a fall and also here last month with diagnosis of Covid.  Patient unable to provide any history.  Differential includes facial fracture, CNS bleed, stroke, cervical fracture.   [MB]  [id number] Extremities were ranged and patient did not appear to be in any pain.  Not on anticoagulation.   [MB]  [id number] CT interpreted by me as no gross head bleed.  Probable nasal fracture.  No gross cervical spine fracture.  Awaiting radiology readings.   [MB]  [id number] CTs with no acute findings other than possible nasal fracture.  They comment upon some bilateral apical thickening that will need to be followed up with an outpatient chest CT.   [MB]    Clinical Course User Index [MB] Hayden Rasmussen, MD   MDM Rules/Calculators/A&P                      Final Clinical Impression(s) / ED Diagnoses Final diagnoses:  Injury of head, initial encounter  Laceration of nose, initial encounter  Abrasion of nose, initial encounter  Fall, initial encounter    Rx / DC Orders ED Discharge Orders    None       Hayden Rasmussen, MD 11/02/19 [phone]

## 2020-02-07 ENCOUNTER — Inpatient Hospital Stay (HOSPITAL_COMMUNITY): Payer: Medicare Other | Admitting: Anesthesiology

## 2020-02-07 ENCOUNTER — Emergency Department (HOSPITAL_COMMUNITY): Payer: Medicare Other

## 2020-02-07 ENCOUNTER — Inpatient Hospital Stay (HOSPITAL_COMMUNITY): Payer: Medicare Other

## 2020-02-07 ENCOUNTER — Encounter (HOSPITAL_COMMUNITY)
Admission: EM | Disposition: A | Discharge status: Home Health | Payer: Self-pay | Source: Skilled Nursing Facility | Attending: Internal Medicine

## 2020-02-07 ENCOUNTER — Encounter (HOSPITAL_COMMUNITY): Payer: Self-pay

## 2020-02-07 ENCOUNTER — Other Ambulatory Visit: Payer: Self-pay

## 2020-02-07 ENCOUNTER — Inpatient Hospital Stay (HOSPITAL_COMMUNITY)
Admission: EM | Admit: 2020-02-07 | Discharge: 2020-02-10 | DRG: 482 | Disposition: A | Discharge status: Home Health | Payer: Medicare Other | Source: Skilled Nursing Facility | Attending: Internal Medicine | Admitting: Internal Medicine

## 2020-02-07 DIAGNOSIS — Z7982 Long term (current) use of aspirin: Secondary | ICD-10-CM | POA: Diagnosis not present

## 2020-02-07 DIAGNOSIS — Z880 Allergy status to penicillin: Secondary | ICD-10-CM

## 2020-02-07 DIAGNOSIS — T148XXA Other injury of unspecified body region, initial encounter: Secondary | ICD-10-CM

## 2020-02-07 DIAGNOSIS — M810 Age-related osteoporosis without current pathological fracture: Secondary | ICD-10-CM | POA: Diagnosis present

## 2020-02-07 DIAGNOSIS — E039 Hypothyroidism, unspecified: Secondary | ICD-10-CM | POA: Diagnosis present

## 2020-02-07 DIAGNOSIS — I10 Essential (primary) hypertension: Secondary | ICD-10-CM | POA: Diagnosis present

## 2020-02-07 DIAGNOSIS — D539 Nutritional anemia, unspecified: Secondary | ICD-10-CM | POA: Diagnosis present

## 2020-02-07 DIAGNOSIS — Z20822 Contact with and (suspected) exposure to covid-19: Secondary | ICD-10-CM | POA: Diagnosis present

## 2020-02-07 DIAGNOSIS — S72142A Displaced intertrochanteric fracture of left femur, initial encounter for closed fracture: Principal | ICD-10-CM

## 2020-02-07 DIAGNOSIS — S72002A Fracture of unspecified part of neck of left femur, initial encounter for closed fracture: Secondary | ICD-10-CM | POA: Diagnosis present

## 2020-02-07 DIAGNOSIS — Z79890 Hormone replacement therapy (postmenopausal): Secondary | ICD-10-CM | POA: Diagnosis not present

## 2020-02-07 DIAGNOSIS — Y92099 Unspecified place in other non-institutional residence as the place of occurrence of the external cause: Secondary | ICD-10-CM

## 2020-02-07 DIAGNOSIS — F0390 Unspecified dementia without behavioral disturbance: Secondary | ICD-10-CM | POA: Diagnosis present

## 2020-02-07 DIAGNOSIS — W1830XA Fall on same level, unspecified, initial encounter: Secondary | ICD-10-CM | POA: Diagnosis present

## 2020-02-07 DIAGNOSIS — Z66 Do not resuscitate: Secondary | ICD-10-CM | POA: Diagnosis present

## 2020-02-07 DIAGNOSIS — Z881 Allergy status to other antibiotic agents status: Secondary | ICD-10-CM

## 2020-02-07 DIAGNOSIS — F329 Major depressive disorder, single episode, unspecified: Secondary | ICD-10-CM | POA: Diagnosis present

## 2020-02-07 DIAGNOSIS — D72828 Other elevated white blood cell count: Secondary | ICD-10-CM | POA: Diagnosis present

## 2020-02-07 DIAGNOSIS — D72829 Elevated white blood cell count, unspecified: Secondary | ICD-10-CM | POA: Diagnosis present

## 2020-02-07 DIAGNOSIS — S72009A Fracture of unspecified part of neck of unspecified femur, initial encounter for closed fracture: Secondary | ICD-10-CM | POA: Diagnosis present

## 2020-02-07 HISTORY — PX: INTRAMEDULLARY (IM) NAIL INTERTROCHANTERIC: SHX5875

## 2020-02-07 LAB — TYPE AND SCREEN
ABO/RH(D): O POS
Antibody Screen: NEGATIVE

## 2020-02-07 LAB — BASIC METABOLIC PANEL
Anion gap: 8 (ref 5–15)
BUN: 29 mg/dL — ABNORMAL HIGH (ref 8–23)
CO2: 24 mmol/L (ref 22–32)
Calcium: 10.2 mg/dL (ref 8.9–10.3)
Chloride: 106 mmol/L (ref 98–111)
Creatinine, Ser: 1.04 mg/dL — ABNORMAL HIGH (ref 0.44–1.00)
GFR calc Af Amer: 55 mL/min — ABNORMAL LOW (ref >60)
GFR calc non Af Amer: 48 mL/min — ABNORMAL LOW (ref >60)
Glucose, Bld: 119 mg/dL — ABNORMAL HIGH (ref 70–99)
Potassium: 3.7 mmol/L (ref 3.5–5.1)
Sodium: 138 mmol/L (ref 135–145)

## 2020-02-07 LAB — SURGICAL PCR SCREEN
MRSA, PCR: NEGATIVE
Staphylococcus aureus: NEGATIVE

## 2020-02-07 LAB — CBC WITH DIFFERENTIAL/PLATELET
Abs Immature Granulocytes: 0.06 10*3/uL (ref 0.00–0.07)
Basophils Absolute: 0 10*3/uL (ref 0.0–0.1)
Basophils Relative: 0 %
Eosinophils Absolute: 0 10*3/uL (ref 0.0–0.5)
Eosinophils Relative: 0 %
HCT: 34.2 % — ABNORMAL LOW (ref 36.0–46.0)
Hemoglobin: 11 g/dL — ABNORMAL LOW (ref 12.0–15.0)
Immature Granulocytes: 0 %
Lymphocytes Relative: 6 %
Lymphs Abs: 1 10*3/uL (ref 0.7–4.0)
MCH: 32.9 pg (ref 26.0–34.0)
MCHC: 32.2 g/dL (ref 30.0–36.0)
MCV: 102.4 fL — ABNORMAL HIGH (ref 80.0–100.0)
Monocytes Absolute: 1.3 10*3/uL — ABNORMAL HIGH (ref 0.1–1.0)
Monocytes Relative: 9 %
Neutro Abs: 13 10*3/uL — ABNORMAL HIGH (ref 1.7–7.7)
Neutrophils Relative %: 85 %
Platelets: 250 10*3/uL (ref 150–400)
RBC: 3.34 MIL/uL — ABNORMAL LOW (ref 3.87–5.11)
RDW: 12.6 % (ref 11.5–15.5)
WBC: 15.4 10*3/uL — ABNORMAL HIGH (ref 4.0–10.5)
nRBC: 0 % (ref 0.0–0.2)

## 2020-02-07 LAB — RESPIRATORY PANEL BY RT PCR (FLU A&B, COVID)
Influenza A by PCR: NEGATIVE
Influenza B by PCR: NEGATIVE
SARS Coronavirus 2 by RT PCR: NEGATIVE

## 2020-02-07 LAB — PROTIME-INR
INR: 1 (ref 0.8–1.2)
Prothrombin Time: 12.9 seconds (ref 11.4–15.2)

## 2020-02-07 LAB — ABO/RH: ABO/RH(D): O POS

## 2020-02-07 SURGERY — FIXATION, FRACTURE, INTERTROCHANTERIC, WITH INTRAMEDULLARY ROD
Anesthesia: Spinal | Laterality: Left

## 2020-02-07 MED ORDER — DOCUSATE SODIUM 100 MG PO CAPS
100.0000 mg | ORAL_CAPSULE | Freq: Two times a day (BID) | ORAL | Status: DC
  Administered 2020-02-08 – 2020-02-10 (×3): 100 mg via ORAL
  Filled 2020-02-07 (×5): qty 1

## 2020-02-07 MED ORDER — METOCLOPRAMIDE HCL 5 MG/ML IJ SOLN: 5.0000 mg | Freq: Three times a day (TID) | INTRAMUSCULAR | Status: DC | PRN

## 2020-02-07 MED ORDER — FENTANYL CITRATE (PF) 100 MCG/2ML IJ SOLN
25.0000 ug | INTRAMUSCULAR | Status: DC | PRN
  Administered 2020-02-07: 19:00:00 25 ug via INTRAVENOUS

## 2020-02-07 MED ORDER — CARBOXYMETH-GLYCERIN-POLYSORB 0.5-1-0.5 % OP SOLN: 1.0000 [drp] | Freq: Two times a day (BID) | OPHTHALMIC | Status: DC

## 2020-02-07 MED ORDER — ASPIRIN EC 325 MG PO TBEC
325.0000 mg | DELAYED_RELEASE_TABLET | Freq: Every day | ORAL | Status: DC
  Administered 2020-02-08 – 2020-02-10 (×3): 325 mg via ORAL
  Filled 2020-02-07 (×3): qty 1

## 2020-02-07 MED ORDER — LORATADINE 10 MG PO TABS
10.0000 mg | ORAL_TABLET | Freq: Every day | ORAL | Status: DC
  Administered 2020-02-08 – 2020-02-10 (×3): 10 mg via ORAL
  Filled 2020-02-07 (×3): qty 1

## 2020-02-07 MED ORDER — LOSARTAN POTASSIUM 50 MG PO TABS
100.0000 mg | ORAL_TABLET | Freq: Every day | ORAL | Status: DC
  Administered 2020-02-09: 100 mg via ORAL
  Filled 2020-02-07 (×3): qty 2

## 2020-02-07 MED ORDER — LEVOTHYROXINE SODIUM 125 MCG PO TABS
125.0000 ug | ORAL_TABLET | Freq: Every day | ORAL | Status: DC
  Administered 2020-02-08 – 2020-02-10 (×3): 125 ug via ORAL
  Filled 2020-02-07 (×3): qty 1

## 2020-02-07 MED ORDER — VANCOMYCIN HCL IN DEXTROSE 1-5 GM/200ML-% IV SOLN
1000.0000 mg | Freq: Two times a day (BID) | INTRAVENOUS | Status: AC
  Administered 2020-02-08: 1000 mg via INTRAVENOUS
  Filled 2020-02-07: qty 200

## 2020-02-07 MED ORDER — HYDROCODONE-ACETAMINOPHEN 5-325 MG PO TABS: 1.0000 | ORAL_TABLET | ORAL | Status: DC | PRN

## 2020-02-07 MED ORDER — FENTANYL CITRATE (PF) 100 MCG/2ML IJ SOLN
INTRAMUSCULAR | Status: AC
  Filled 2020-02-07: qty 2

## 2020-02-07 MED ORDER — METOPROLOL TARTRATE 5 MG/5ML IV SOLN: 2.5000 mg | Freq: Four times a day (QID) | INTRAVENOUS | Status: DC | PRN

## 2020-02-07 MED ORDER — DONEPEZIL HCL 10 MG PO TABS
10.0000 mg | ORAL_TABLET | Freq: Every day | ORAL | Status: DC
  Administered 2020-02-08 – 2020-02-09 (×2): 10 mg via ORAL
  Filled 2020-02-07 (×3): qty 1

## 2020-02-07 MED ORDER — MORPHINE SULFATE (PF) 2 MG/ML IV SOLN
2.0000 mg | INTRAVENOUS | Status: DC | PRN
  Administered 2020-02-07: 2 mg via INTRAVENOUS
  Filled 2020-02-07: qty 1

## 2020-02-07 MED ORDER — ACETAMINOPHEN 325 MG PO TABS
325.0000 mg | ORAL_TABLET | Freq: Four times a day (QID) | ORAL | Status: DC | PRN
  Administered 2020-02-08 – 2020-02-10 (×5): 650 mg via ORAL
  Filled 2020-02-07 (×5): qty 2

## 2020-02-07 MED ORDER — PROPOFOL 500 MG/50ML IV EMUL
INTRAVENOUS | Status: DC | PRN
  Administered 2020-02-07: 50 ug/kg/min via INTRAVENOUS
  Administered 2020-02-07: 30 mg via INTRAVENOUS

## 2020-02-07 MED ORDER — POLYVINYL ALCOHOL 1.4 % OP SOLN
1.0000 [drp] | Freq: Two times a day (BID) | OPHTHALMIC | Status: DC
  Administered 2020-02-07 – 2020-02-10 (×5): 1 [drp] via OPHTHALMIC
  Filled 2020-02-07: qty 15

## 2020-02-07 MED ORDER — FLUTICASONE PROPIONATE 50 MCG/ACT NA SUSP
1.0000 | Freq: Every day | NASAL | Status: DC
  Administered 2020-02-08 – 2020-02-10 (×3): 1 via NASAL
  Filled 2020-02-07: qty 16

## 2020-02-07 MED ORDER — POVIDONE-IODINE 10 % EX SWAB
2.0000 "application " | Freq: Once | CUTANEOUS | Status: AC
  Administered 2020-02-07: 2 via TOPICAL

## 2020-02-07 MED ORDER — DULOXETINE HCL 30 MG PO CPEP
30.0000 mg | ORAL_CAPSULE | Freq: Every day | ORAL | Status: DC
  Administered 2020-02-08 – 2020-02-10 (×3): 30 mg via ORAL
  Filled 2020-02-07 (×3): qty 1

## 2020-02-07 MED ORDER — PHENOL 1.4 % MT LIQD: 1.0000 | OROMUCOSAL | Status: DC | PRN

## 2020-02-07 MED ORDER — PROMETHAZINE HCL 25 MG/ML IJ SOLN: 6.2500 mg | INTRAMUSCULAR | Status: DC | PRN

## 2020-02-07 MED ORDER — FENTANYL CITRATE (PF) 100 MCG/2ML IJ SOLN
INTRAMUSCULAR | Status: DC | PRN
  Administered 2020-02-07: 50 ug via INTRAVENOUS

## 2020-02-07 MED ORDER — ONDANSETRON HCL 4 MG PO TABS: 4.0000 mg | ORAL_TABLET | Freq: Four times a day (QID) | ORAL | Status: DC | PRN

## 2020-02-07 MED ORDER — CHLORHEXIDINE GLUCONATE 4 % EX LIQD: 60.0000 mL | Freq: Once | CUTANEOUS | Status: DC

## 2020-02-07 MED ORDER — MORPHINE SULFATE (PF) 2 MG/ML IV SOLN: 0.5000 mg | INTRAVENOUS | Status: DC | PRN

## 2020-02-07 MED ORDER — STERILE WATER FOR IRRIGATION IR SOLN
Status: DC | PRN
  Administered 2020-02-07: 2000 mL

## 2020-02-07 MED ORDER — ALBUMIN HUMAN 5 % IV SOLN: INTRAVENOUS | Status: DC | PRN

## 2020-02-07 MED ORDER — 0.9 % SODIUM CHLORIDE (POUR BTL) OPTIME
TOPICAL | Status: DC | PRN
  Administered 2020-02-07: 1000 mL

## 2020-02-07 MED ORDER — ACETAMINOPHEN 325 MG PO TABS: 650.0000 mg | ORAL_TABLET | Freq: Four times a day (QID) | ORAL | Status: DC | PRN

## 2020-02-07 MED ORDER — ONDANSETRON HCL 4 MG/2ML IJ SOLN: 4.0000 mg | Freq: Four times a day (QID) | INTRAMUSCULAR | Status: DC | PRN

## 2020-02-07 MED ORDER — HYDROCODONE-ACETAMINOPHEN 7.5-325 MG PO TABS: 1.0000 | ORAL_TABLET | ORAL | Status: DC | PRN

## 2020-02-07 MED ORDER — BUPIVACAINE IN DEXTROSE 0.75-8.25 % IT SOLN
INTRATHECAL | Status: DC | PRN
  Administered 2020-02-07: 1.6 mL via INTRATHECAL

## 2020-02-07 MED ORDER — PHENYLEPHRINE HCL (PRESSORS) 10 MG/ML IV SOLN
INTRAVENOUS | Status: DC | PRN
  Administered 2020-02-07 (×4): 80 ug via INTRAVENOUS

## 2020-02-07 MED ORDER — LACTATED RINGERS IV SOLN: INTRAVENOUS | Status: DC

## 2020-02-07 MED ORDER — SODIUM CHLORIDE 0.9 % IV SOLN: INTRAVENOUS | Status: DC

## 2020-02-07 MED ORDER — VANCOMYCIN HCL IN DEXTROSE 1-5 GM/200ML-% IV SOLN
1000.0000 mg | INTRAVENOUS | Status: AC
  Administered 2020-02-07: 1000 mg via INTRAVENOUS
  Filled 2020-02-07 (×2): qty 200

## 2020-02-07 MED ORDER — METOCLOPRAMIDE HCL 5 MG PO TABS: 5.0000 mg | ORAL_TABLET | Freq: Three times a day (TID) | ORAL | Status: DC | PRN

## 2020-02-07 MED ORDER — ACETAMINOPHEN 650 MG RE SUPP: 650.0000 mg | Freq: Four times a day (QID) | RECTAL | Status: DC | PRN

## 2020-02-07 MED ORDER — MENTHOL 3 MG MT LOZG: 1.0000 | LOZENGE | OROMUCOSAL | Status: DC | PRN

## 2020-02-07 MED ORDER — METOPROLOL TARTRATE 12.5 MG HALF TABLET
12.5000 mg | ORAL_TABLET | Freq: Every day | ORAL | Status: DC
  Administered 2020-02-08 – 2020-02-09 (×2): 12.5 mg via ORAL
  Filled 2020-02-07 (×3): qty 1

## 2020-02-07 MED ORDER — PROPOFOL 500 MG/50ML IV EMUL: INTRAVENOUS | Status: DC | PRN

## 2020-02-07 MED ORDER — MORPHINE SULFATE (PF) 2 MG/ML IV SOLN
2.0000 mg | Freq: Once | INTRAVENOUS | Status: AC
  Administered 2020-02-07: 2 mg via INTRAVENOUS
  Filled 2020-02-07: qty 1

## 2020-02-07 SURGICAL SUPPLY — 37 items
BNDG GAUZE ELAST 4 BULKY (GAUZE/BANDAGES/DRESSINGS) ×3 IMPLANT
COVER PERINEAL POST (MISCELLANEOUS) ×3 IMPLANT
COVER SURGICAL LIGHT HANDLE (MISCELLANEOUS) ×3 IMPLANT
COVER WAND RF STERILE (DRAPES) IMPLANT
DRAPE STERI IOBAN 125X83 (DRAPES) ×3 IMPLANT
DRSG AQUACEL AG ADV 3.5X10 (GAUZE/BANDAGES/DRESSINGS) ×3 IMPLANT
DRSG MEPILEX BORDER 4X4 (GAUZE/BANDAGES/DRESSINGS) ×6 IMPLANT
DRSG MEPILEX BORDER 4X8 (GAUZE/BANDAGES/DRESSINGS) IMPLANT
DURAPREP 26ML APPLICATOR (WOUND CARE) ×3 IMPLANT
ELECT REM PT RETURN 15FT ADLT (MISCELLANEOUS) ×3 IMPLANT
FACESHIELD WRAPAROUND (MASK) ×3 IMPLANT
GAUZE XEROFORM 1X8 LF (GAUZE/BANDAGES/DRESSINGS) ×3 IMPLANT
GAUZE XEROFORM 5X9 LF (GAUZE/BANDAGES/DRESSINGS) ×3 IMPLANT
GLOVE BIO SURGEON STRL SZ7.5 (GLOVE) ×3 IMPLANT
GLOVE BIOGEL PI IND STRL 8 (GLOVE) ×1 IMPLANT
GLOVE BIOGEL PI INDICATOR 8 (GLOVE) ×2
GLOVE ECLIPSE 8.0 STRL XLNG CF (GLOVE) ×6 IMPLANT
GOWN STRL REUS W/TWL XL LVL3 (GOWN DISPOSABLE) ×3 IMPLANT
GUIDEPIN 3.2X17.5 THRD DISP (PIN) ×6 IMPLANT
HFN LH 130 DEG 9MM X 360MM (Nail) ×3 IMPLANT
HIP FRAC NAIL LAG SCR 10.5X100 (Orthopedic Implant) ×2 IMPLANT
KIT BASIN (CUSTOM PROCEDURE TRAY) ×3 IMPLANT
KIT TURNOVER KIT A (KITS) IMPLANT
MANIFOLD NEPTUNE II (INSTRUMENTS) ×3 IMPLANT
NS IRRIG 1000ML POUR BTL (IV SOLUTION) ×3 IMPLANT
PACK GENERAL/GYN (CUSTOM PROCEDURE TRAY) ×3 IMPLANT
PENCIL SMOKE EVACUATOR (MISCELLANEOUS) ×3 IMPLANT
PROTECTOR NERVE ULNAR (MISCELLANEOUS) ×3 IMPLANT
SCREW CANN THRD AFF 10.5X100 (Orthopedic Implant) ×1 IMPLANT
STAPLER VISISTAT 35W (STAPLE) ×3 IMPLANT
SUT VIC AB 0 CT1 36 (SUTURE) ×3 IMPLANT
SUT VIC AB 1 CT1 36 (SUTURE) ×3 IMPLANT
SUT VIC AB 2-0 CT1 27 (SUTURE) ×2
SUT VIC AB 2-0 CT1 TAPERPNT 27 (SUTURE) ×1 IMPLANT
TOWEL OR 17X26 10 PK STRL BLUE (TOWEL DISPOSABLE) ×3 IMPLANT
TRAY FOLEY MTR SLVR 14FR STAT (SET/KITS/TRAYS/PACK) ×3 IMPLANT
YANKAUER SUCT BULB TIP 10FT TU (MISCELLANEOUS) ×3 IMPLANT

## 2020-02-07 NOTE — Anesthesia Procedure Notes (Signed)
Spinal  Patient location during procedure: OR Start time: 02/07/2020 4:44 PM End time: 02/07/2020 4:54 PM Staffing Performed: anesthesiologist  Anesthesiologist: Duane Boston, MD Preanesthetic Checklist Completed: patient identified, IV checked, risks and benefits discussed, surgical consent, monitors and equipment checked, pre-op evaluation and timeout performed Spinal Block Patient position: sitting Prep: DuraPrep Patient monitoring: cardiac monitor, continuous pulse ox and blood pressure Approach: left paramedian Location: L3-4 Injection technique: single-shot Needle Needle type: Quincke  Needle gauge: 22 G Needle length: 9 cm Additional Notes Functioning IV was confirmed and monitors were applied. Sterile prep and drape, including hand hygiene and sterile gloves were used. The patient was positioned and the spine was prepped. The skin was anesthetized with lidocaine.  Free flow of clear CSF was obtained prior to injecting local anesthetic into the CSF.  The spinal needle aspirated freely following injection.  The needle was carefully withdrawn.  The patient tolerated the procedure well.

## 2020-02-07 NOTE — Plan of Care (Signed)
Plan of care 

## 2020-02-07 NOTE — Brief Op Note (Signed)
02/07/2020  5:49 PM  PATIENT:  Reola Calkins  86 y.o. female  PRE-OPERATIVE DIAGNOSIS:  left hip fracture  POST-OPERATIVE DIAGNOSIS:  left hip fracture  PROCEDURE:  Procedure(s): INTRAMEDULLARY (IM) NAIL INTERTROCHANTRIC (Left)  SURGEON:  Surgeon(s) and Role:    Mcarthur Rossetti, MD - Primary  PHYSICIAN ASSISTANT: Benita Stabile, PA-C  ANESTHESIA:   spinal  EBL:  100 mL   COUNTS:  YES  DICTATION: .Other Dictation: Dictation Number [id number]  PLAN OF CARE: Admit to inpatient   PATIENT DISPOSITION:  PACU - hemodynamically stable.   Delay start of Pharmacological VTE agent (>24hrs) due to surgical blood loss or risk of bleeding: no

## 2020-02-07 NOTE — Transfer of Care (Signed)
Immediate Anesthesia Transfer of Care Note  Patient: Julia Carey  Procedure(s) Performed: INTRAMEDULLARY (IM) NAIL INTERTROCHANTRIC (Left )  Patient Location: PACU  Anesthesia Type:Spinal  Level of Consciousness: sedated and responds to stimulation  Airway & Oxygen Therapy: Patient Spontanous Breathing and Patient connected to face mask oxygen  Post-op Assessment: Report given to RN and Post -op Vital signs reviewed and stable  Post vital signs: Reviewed and stable  Last Vitals:  Vitals Value Taken Time  BP 128/76 02/07/20 1808  Temp    Pulse    Resp 21 02/07/20 1811  SpO2    Vitals shown include unvalidated device data.  Last Pain:  Vitals:   02/07/20 1600  TempSrc: Oral         Complications: No apparent anesthesia complications

## 2020-02-07 NOTE — ED Triage Notes (Signed)
Per ems: Pt coming from [place] at [facility] after unwitnessed fall tonight. Rotated and shortening to left hip but no reports of pain with palpation or movement. Hx of dementia.

## 2020-02-07 NOTE — Consult Note (Signed)
Reason for Consult:Left hip fx Referring Physician: Cyndi Bender  Julia Carey is an 87 y.o. female.  HPI: Julia Carey was at the ALF where she resides and was found on the floor. She is severely demented and cannot contribute to history. She was brought to the ED where x-rays showed a left hip fx. She is ambulatory at the memory care facility with a RW or WC. She's been getting up inappropriately more often of late.  Past Medical History:  Diagnosis Date  . Age-related osteoporosis without current pathological fracture   . Dementia ([place])   . Dementia ([place])   . Dysphagia   . Fracture of first cervical vertebra (HCC)   . Hypothyroidism   . Insomnia   . Major depression, single episode   . Muscle weakness (generalized)     History reviewed. No pertinent surgical history.  History reviewed. No pertinent family history.  Social History:  has an unknown smoking status. She has never used smokeless tobacco. She reports previous alcohol use. She reports previous drug use.  Allergies:  Allergies  Allergen Reactions  . Amoxicillin Hives    "Allergic," per MAR: Did it involve swelling of the face/tongue/throat, SOB, or low BP? Unk Did it involve sudden or severe rash/hives, skin peeling, or any reaction on the inside of your mouth or nose? Unk Did you need to seek medical attention at a hospital or doctor's office? Unk When did it last happen? Unk If all above answers are "NO", may proceed with cephalosporin use.   . Clindamycin/Lincomycin Hives    "Allergic, " per MAR  . Keflex [Cephalexin] Hives    "Allergic, " per MAR    Medications: I have reviewed the patient's current medications.  Results for orders placed or performed during the hospital encounter of 02/07/20 (from the past 48 hour(s))  Basic metabolic panel     Status: Abnormal   Collection Time: 02/07/20  4:47 AM  Result Value Ref Range   Sodium 138 135 - 145 mmol/L   Potassium 3.7 3.5 - 5.1 mmol/L   Chloride 106 98 - 111  mmol/L   CO2 24 22 - 32 mmol/L   Glucose, Bld 119 (H) 70 - 99 mg/dL    Comment: Glucose reference range applies only to samples taken after fasting for at least 8 hours.   BUN 29 (H) 8 - 23 mg/dL   Creatinine, Ser 1.04 (H) 0.44 - 1.00 mg/dL   Calcium 10.2 8.9 - 10.3 mg/dL   GFR calc non Af Amer 48 (L) >60 mL/min   GFR calc Af Amer 55 (L) >60 mL/min   Anion gap 8 5 - 15    Comment: Performed at [facility], [place] [address] [place], [place] [address]  CBC WITH DIFFERENTIAL     Status: Abnormal   Collection Time: 02/07/20  4:47 AM  Result Value Ref Range   WBC 15.4 (H) 4.0 - 10.5 K/uL   RBC 3.34 (L) 3.87 - 5.11 MIL/uL   Hemoglobin 11.0 (L) 12.0 - 15.0 g/dL   HCT 34.2 (L) 36.0 - 46.0 %   MCV 102.4 (H) 80.0 - 100.0 fL   MCH 32.9 26.0 - 34.0 pg   MCHC 32.2 30.0 - 36.0 g/dL   RDW 12.6 11.5 - 15.5 %   Platelets 250 150 - 400 K/uL   nRBC 0.0 0.0 - 0.2 %   Neutrophils Relative % 85 %   Neutro Abs 13.0 (H) 1.7 - 7.7 K/uL   Lymphocytes Relative  6 %   Lymphs Abs 1.0 0.7 - 4.0 K/uL   Monocytes Relative 9 %   Monocytes Absolute 1.3 (H) 0.1 - 1.0 K/uL   Eosinophils Relative 0 %   Eosinophils Absolute 0.0 0.0 - 0.5 K/uL   Basophils Relative 0 %   Basophils Absolute 0.0 0.0 - 0.1 K/uL   Immature Granulocytes 0 %   Abs Immature Granulocytes 0.06 0.00 - 0.07 K/uL    Comment: Performed at [facility], [place] [address], [place], [place] [address]  Protime-INR     Status: None   Collection Time: 02/07/20  4:47 AM  Result Value Ref Range   Prothrombin Time 12.9 11.4 - 15.2 seconds   INR 1.0 0.8 - 1.2    Comment: (NOTE) INR goal varies based on device and disease states. Performed at [facility], [place] [address], [place], [place] [address]   Type and screen [place]     Status: None   Collection Time: 02/07/20  4:47 AM  Result Value Ref Range   ABO/RH(D) O POS    Antibody Screen NEG    Sample Expiration       02/10/2020,2359 Performed at [facility], [place] [address], [place], [place] [address]   Respiratory Panel by RT PCR (Flu A&B, Covid) - Nasopharyngeal Swab     Status: None   Collection Time: 02/07/20  4:47 AM   Specimen: Nasopharyngeal Swab  Result Value Ref Range   SARS Coronavirus 2 by RT PCR NEGATIVE NEGATIVE    Comment: (NOTE) SARS-CoV-2 target nucleic acids are NOT DETECTED. The SARS-CoV-2 RNA is generally detectable in upper respiratoy specimens during the acute phase of infection. The lowest concentration of SARS-CoV-2 viral copies this assay can detect is 131 copies/mL. A negative result does not preclude SARS-Cov-2 infection and should not be used as the sole basis for treatment or other patient management decisions. A negative result may occur with  improper specimen collection/handling, submission of specimen other than nasopharyngeal swab, presence of viral mutation(s) within the areas targeted by this assay, and inadequate number of viral copies (<131 copies/mL). A negative result must be combined with clinical observations, patient history, and epidemiological information. The expected result is Negative. Fact Sheet for Patients:  PinkCheek.be Fact Sheet for Healthcare Providers:  GravelBags.it This test is not yet ap proved or cleared by the [place] FDA and  has been authorized for detection and/or diagnosis of SARS-CoV-2 by FDA under an Emergency Use Authorization (EUA). This EUA will remain  in effect (meaning this test can be used) for the duration of the COVID-19 declaration under Section 564(b)(1) of the Act, 21 U.S.C. section 360bbb-3(b)(1), unless the authorization is terminated or revoked sooner.    Influenza A by PCR NEGATIVE NEGATIVE   Influenza B by PCR NEGATIVE NEGATIVE    Comment: (NOTE) The Xpert Xpress SARS-CoV-2/FLU/RSV assay is intended as an aid in  the diagnosis of  influenza from Nasopharyngeal swab specimens and  should not be used as a sole basis for treatment. Nasal washings and  aspirates are unacceptable for Xpert Xpress SARS-CoV-2/FLU/RSV  testing. Fact Sheet for Patients: PinkCheek.be Fact Sheet for Healthcare Providers: GravelBags.it This test is not yet approved or cleared by the [place] FDA and  has been authorized for detection and/or diagnosis of SARS-CoV-2 by  FDA under an Emergency Use Authorization (EUA). This EUA will remain  in effect (meaning this test can be used) for the duration of the  Covid-19 declaration under Section 564(b)(1) of the Act, 21  U.S.C. section 360bbb-3(b)(1), unless the authorization is  terminated or revoked. Performed at [facility], [place] [address] [place], [place] [address]   ABO/Rh     Status: None   Collection Time: 02/07/20  4:47 AM  Result Value Ref Range   ABO/RH(D)      O POS Performed at [facility], [place] [address] [place], [place] [address]     DG Chest 1 View  Result Date: 02/07/2020 CLINICAL DATA:  Fall fractured hip preop EXAM: CHEST  1 VIEW COMPARISON:  October 07, 2019 FINDINGS: There is stable cardiomegaly. Aortic knob and descending aortic calcifications are seen. There are coarsened interstitial markings seen throughout both lung bases with hyperinflation. No new airspace consolidation or pleural effusion are seen. Bilateral shoulder osteoarthritis is noted. A dextroconvex scoliotic curvature of the midthoracic spine is seen. IMPRESSION: No active disease. Electronically Signed   By: Prudencio Pair M.D.   On: 02/07/2020 04:30   DG Hip Unilat With Pelvis 2-3 Views Left  Result Date: 02/07/2020 CLINICAL DATA:  Fall, obvious deformity EXAM: DG HIP (WITH OR WITHOUT PELVIS) 2-3V LEFT COMPARISON:  None. FINDINGS: There is a comminuted fracture of the left femoral neck fracture involving the  greater trochanter. There is slight lateral angulation of the femoral shaft. The femoral head is still well seated within the acetabulum. IMPRESSION: Comminuted mildly angulated left femoral neck fracture involving the greater trochanter. Electronically Signed   By: Prudencio Pair M.D.   On: 02/07/2020 04:32    Review of Systems  Unable to perform ROS: Dementia   Blood pressure (!) 150/57, pulse 73, temperature (!) 97.5 F (36.4 C), temperature source Axillary, resp. rate 15, SpO2 100 %. Physical Exam  Constitutional: She appears well-developed and well-nourished. No distress.  HENT:  Head: Normocephalic and atraumatic.  Eyes: Conjunctivae are normal. Right eye exhibits no discharge. Left eye exhibits no discharge. No scleral icterus.  Cardiovascular: Normal rate and regular rhythm.  Respiratory: Effort normal. No respiratory distress.  Musculoskeletal:     Cervical back: Normal range of motion.     Comments: LLE No traumatic wounds or rash, ecchymosis over greater troch  TTP hip  No knee or ankle effusion  Knee stable to varus/ valgus and anterior/posterior stress  Sens DPN, SPN, TN could not assess  Motor EHL, ext, flex, evers could not assess  DP 1+, No significant edema  Neurological: She is alert.  Skin: Skin is warm and dry. She is not diaphoretic.  Psychiatric:  Non-verbal    Assessment/Plan: Left hip fx -- Plan IMN later today by Dr. Ninfa Linden. Please keep NPO. Multiple medical problems including advanced dementia, hypothyroidism, hypertension, and osteoporosis -- per primary service    Lisette Abu, PA-C Orthopedic Surgery [phone] 02/07/2020, 1:29 PM

## 2020-02-07 NOTE — H&P (Signed)
History and Physical    Julia Carey [id number] DOB: [birth year] DOA: 02/07/2020  PCP: System, Pcp Not In   Patient coming from: Home  I have personally briefly reviewed patient's old medical records in [place]  Chief Complaint: Fall and hip pain  HPI: Julia Carey is a 86 y.o. female with medical history significant of advanced dementia, hypothyroidism, hypertension, osteoporosis presented from skilled nursing facility following an unwitnessed fall and subsequent left hip pain afterwards.  Patient has advanced dementia and does not provide any history.  I have reviewed patient's medical records including ED providers documentation myself.  ED Course: X-ray of the hip showed left intertrochanteric hip fracture.  Orthopedics was consulted who recommended patient be transferred to [facility] for further surgical intervention.  Hospitalist service was called to evaluate the patient.  Review of Systems: Could not be obtained because patient has advanced dementia.   Past Medical History:  Diagnosis Date  . Age-related osteoporosis without current pathological fracture   . Dementia ([place])   . Dementia ([place])   . Dysphagia   . Fracture of first cervical vertebra (HCC)   . Hypothyroidism   . Insomnia   . Major depression, single episode   . Muscle weakness (generalized)     History reviewed. No pertinent surgical history.  Social history  has an unknown smoking status. She has never used smokeless tobacco. She reports previous alcohol use. She reports previous drug use.  Allergies  Allergen Reactions  . Amoxicillin Hives    "Allergic," per MAR: Did it involve swelling of the face/tongue/throat, SOB, or low BP? Unk Did it involve sudden or severe rash/hives, skin peeling, or any reaction on the inside of your mouth or nose? Unk Did you need to seek medical attention at a hospital or doctor's office? Unk When did it last happen? Unk If all above answers are  "NO", may proceed with cephalosporin use.   . Clindamycin/Lincomycin Hives    "Allergic, " per MAR  . Keflex [Cephalexin] Hives    "Allergic, " per [facility]    Family history could not be obtained because of patient's advanced dementia  Prior to Admission medications   Medication Sig Start Date End Date Taking? Authorizing Provider  acetaminophen (TYLENOL) 325 MG tablet Take 325 mg by mouth 3 (three) times daily.    Yes [provider]  aspirin 81 MG chewable tablet Chew 81 mg by mouth daily.   Yes [provider]  Carboxymeth-Glycerin-Polysorb (REFRESH OPTIVE ADVANCED) 0.5-1-0.5 % SOLN Place 1 drop into both eyes 2 (two) times daily.    Yes [provider]  cetirizine (ZYRTEC) 5 MG tablet Take 5 mg by mouth daily.   Yes [provider]  donepezil (ARICEPT) 10 MG tablet Take 10 mg by mouth at bedtime.    Yes [provider]  DULoxetine (CYMBALTA) 30 MG capsule Take 30 mg by mouth daily.   Yes [provider]  Eyelid Cleansers (OCUSOFT EYELID CLEANSING EX) Place 1 application into both eyes daily.    Yes [provider]  fluticasone (FLONASE) 50 MCG/ACT nasal spray Place 1 spray into both nostrils daily.   Yes [provider]  levothyroxine (SYNTHROID) 125 MCG tablet Take 125 mcg by mouth daily before breakfast.   Yes [provider]  loperamide (IMODIUM A-D) 2 MG tablet Take 2 mg by mouth 4 (four) times daily as needed for diarrhea or loose stools.   Yes [provider]  losartan (COZAAR) 100  MG tablet Take 1 tablet (100 mg total) by mouth daily for 30 days. 02/21/19 02/07/20 Yes Aline August, MD  Melatonin 3 MG TABS Take 3 mg by mouth at bedtime.   Yes [provider]  methocarbamol (ROBAXIN) 500 MG tablet Take 500 mg by mouth 2 (two) times daily as needed for muscle spasms.   Yes [provider]  metoprolol tartrate (LOPRESSOR) 25 MG tablet Take 12.5 mg by mouth every morning.    Yes  [provider]  omeprazole (PRILOSEC) 20 MG capsule Take 20 mg by mouth daily as needed (reflux).   Yes [provider]  polyethylene glycol (MIRALAX / GLYCOLAX) packet Take 17 g by mouth daily. MIX INTO 8 OUNCES OF LIQUID   Yes [provider]  PROTEIN PO Take 1 Bottle by mouth 3 (three) times daily after meals.   Yes [provider]  vitamin B-12 (CYANOCOBALAMIN) 1000 MCG tablet Take 1,000 mcg by mouth every other day.    Yes [provider]  azithromycin (ZITHROMAX) 250 MG tablet Take 1 tablet (250 mg total) by mouth daily. Patient not taking: Reported on 02/07/2020 10/08/19   Lorin Glass, PA-C  dexamethasone (DECADRON) 6 MG tablet Take 1 tablet (6 mg total) by mouth daily. Patient not taking: Reported on 02/07/2020 10/08/19   Lorin Glass, PA-C  levothyroxine (SYNTHROID) 100 MCG tablet Take 1 tablet (100 mcg total) by mouth daily for 30 days. Patient not taking: Reported on 02/07/2020 02/21/19 04/17/19  Aline August, MD    Physical Exam: Vitals:   02/07/20 0600 02/07/20 0615 02/07/20 0630 02/07/20 0645  BP: (!) 158/62 (!) 149/59 (!) 143/63 135/78  Pulse: 81 76 79 80  Resp: 17 17 16 14   Temp:      TempSrc:      SpO2: 100% 98% 100% 100%    Constitutional: NAD, calm, comfortable.  Elderly female lying in bed, awake, hardly answers any questions. Vitals:   02/07/20 0600 02/07/20 0615 02/07/20 0630 02/07/20 0645  BP: (!) 158/62 (!) 149/59 (!) 143/63 135/78  Pulse: 81 76 79 80  Resp: 17 17 16 14   Temp:      TempSrc:      SpO2: 100% 98% 100% 100%   Eyes: PERRL, lids and conjunctivae normal ENMT: Mucous membranes are moist. Posterior pharynx clear of any exudate or lesions. Neck: normal, supple, no masses, no thyromegaly Respiratory: bilateral decreased breath sounds at bases, no wheezing, no crackles. Normal respiratory effort. No accessory muscle use.  Cardiovascular: S1 S2 positive, rate controlled. No extremity edema. 2+  pedal pulses.  Abdomen: no tenderness, no masses palpated. No hepatosplenomegaly. Bowel sounds positive.  Musculoskeletal: no clubbing / cyanosis.  Left hip tenderness is present.   Skin: no rashes, lesions, ulcers. No induration Neurologic: CN 2-12 grossly intact. Moving extremities. No focal neurologic deficits.  Awake, does not answer any questions. Psychiatric: Cannot assess because of patient being demented   Labs on Admission: I have personally reviewed following labs and imaging studies  CBC: Recent Labs  Lab 02/07/20 0447  WBC 15.4*  NEUTROABS 13.0*  HGB 11.0*  HCT 34.2*  MCV 102.4*  PLT [id number]   Basic Metabolic Panel: Recent Labs  Lab 02/07/20 0447  NA 138  K 3.7  CL 106  CO2 24  GLUCOSE 119*  BUN 29*  CREATININE 1.04*  CALCIUM 10.2   GFR: CrCl cannot be calculated (Unknown ideal weight.). Liver Function Tests: No results for input(s): AST, ALT, ALKPHOS, BILITOT, PROT, ALBUMIN  in the last 168 hours. No results for input(s): LIPASE, AMYLASE in the last 168 hours. No results for input(s): AMMONIA in the last 168 hours. Coagulation Profile: Recent Labs  Lab 02/07/20 0447  INR 1.0   Cardiac Enzymes: No results for input(s): CKTOTAL, CKMB, CKMBINDEX, TROPONINI in the last 168 hours. BNP (last 3 results) No results for input(s): PROBNP in the last 8760 hours. HbA1C: No results for input(s): HGBA1C in the last 72 hours. CBG: No results for input(s): GLUCAP in the last 168 hours. Lipid Profile: No results for input(s): CHOL, HDL, LDLCALC, TRIG, CHOLHDL, LDLDIRECT in the last 72 hours. Thyroid Function Tests: No results for input(s): TSH, T4TOTAL, FREET4, T3FREE, THYROIDAB in the last 72 hours. Anemia Panel: No results for input(s): VITAMINB12, FOLATE, FERRITIN, TIBC, IRON, RETICCTPCT in the last 72 hours. Urine analysis:    Component Value Date/Time   COLORURINE YELLOW 10/07/2019 1851   APPEARANCEUR HAZY (A) 10/07/2019 1851   LABSPEC 1.024 10/07/2019  1851   PHURINE 5.0 10/07/2019 1851   GLUCOSEU NEGATIVE 10/07/2019 1851   HGBUR NEGATIVE 10/07/2019 [place] 10/07/2019 1851   KETONESUR 5 (A) 10/07/2019 1851   PROTEINUR 100 (A) 10/07/2019 1851   NITRITE NEGATIVE 10/07/2019 1851   LEUKOCYTESUR NEGATIVE 10/07/2019 1851    Radiological Exams on Admission: DG Chest 1 View  Result Date: 02/07/2020 CLINICAL DATA:  Fall fractured hip preop EXAM: CHEST  1 VIEW COMPARISON:  October 07, 2019 FINDINGS: There is stable cardiomegaly. Aortic knob and descending aortic calcifications are seen. There are coarsened interstitial markings seen throughout both lung bases with hyperinflation. No new airspace consolidation or pleural effusion are seen. Bilateral shoulder osteoarthritis is noted. A dextroconvex scoliotic curvature of the midthoracic spine is seen. IMPRESSION: No active disease. Electronically Signed   By: Prudencio Pair M.D.   On: 02/07/2020 04:30   DG Hip Unilat With Pelvis 2-3 Views Left  Result Date: 02/07/2020 CLINICAL DATA:  Fall, obvious deformity EXAM: DG HIP (WITH OR WITHOUT PELVIS) 2-3V LEFT COMPARISON:  None. FINDINGS: There is a comminuted fracture of the left femoral neck fracture involving the greater trochanter. There is slight lateral angulation of the femoral shaft. The femoral head is still well seated within the acetabulum. IMPRESSION: Comminuted mildly angulated left femoral neck fracture involving the greater trochanter. Electronically Signed   By: Prudencio Pair M.D.   On: 02/07/2020 04:32    EKG: Independently reviewed.  Sinus rhythm with no ST elevations or depressions  Assessment/Plan  Comminuted left femoral neck fracture Mechanical fall -Patient presented with a mechanical fall and has comminuted left femoral neck fracture -Orthopedics has been consulted and request transfer to [facility] for surgical intervention today. -NPO.  Pain management.  All precaution.  Will need PT/OT evaluation  after surgical intervention  Leukocytosis -Probably reactive.  Monitor  History of advanced dementia -Monitor for delirium.  Continue donepezil -Patient follows up with palliative care as an outpatient.  If condition worsens during the hospitalization, consider palliative care consultation.  Hypertension -Monitor blood pressure -Continue metoprolol and losartan from tomorrow  Hypothyroidism -Continue Synthroid   DVT prophylaxis: SCDs.  Hold Lovenox as patient is going for surgery today Code Status: DNR Family Communication: Spoke to Tracy/nephew at bedside who is the power of attorney as well Disposition Plan: We will remain inpatient for 2 to 4 days pending surgical intervention and outcome Consults called: Orthopedics/Dr. Marlou Sa Admission status: Inpatient/MedSurg  Severity of Illness: The appropriate patient status for this patient is  INPATIENT. Inpatient status is judged to be reasonable and necessary in order to provide the required intensity of service to ensure the patient's safety. The patient's presenting symptoms, physical exam findings, and initial radiographic and laboratory data in the context of their chronic comorbidities is felt to place them at high risk for further clinical deterioration. Furthermore, it is not anticipated that the patient will be medically stable for discharge from the hospital within 2 midnights of admission. The following factors support the patient status of inpatient.   " The patient's presenting symptoms include fall and hip pain. " The worrisome physical exam findings include left hip tenderness. " The initial radiographic and laboratory data are worrisome because of left hip fracture. " The chronic co-morbidities include advanced dementia/hypertension/hypothyroidism.   * I certify that at the point of admission it is my clinical judgment that the patient will require inpatient hospital care spanning beyond 2 midnights from the point of  admission due to high intensity of service, high risk for further deterioration and high frequency of surveillance required.Aline August MD [facility]  02/07/2020, 8:14 AM

## 2020-02-07 NOTE — ED Notes (Signed)
Enters my care. Report from night RN. Pt resting comfortably in stretcher. Family at bedside. Pt to be transferred to [facility] for surgery. Family aware. Awaiting med verification from pharmacy for morning meds. NAD at this time. Will cont to monitor.

## 2020-02-07 NOTE — Progress Notes (Addendum)
Manufacturing engineer [facility])  Ms. Julia Carey is under our hospice services with Alzheimer's Dementia. Patient is from [facility] facility where she fell causing a left hip fracture. This is a covered and related admission per our Dr. Eulas Post. We will follow [name] during her hospital stay and help with any discharge plans.  Please feel free to contact us with any questions or concerns.   Clementeen Hoof, BSN, [facility]) [phone]

## 2020-02-07 NOTE — Anesthesia Preprocedure Evaluation (Addendum)
Anesthesia Evaluation  Patient identified by MRN, date of birth, ID band Patient confused    Reviewed: Allergy & Precautions, NPO status , Patient's Chart, lab work & pertinent test results  History of Anesthesia Complications Negative for: history of anesthetic complications  Airway Mallampati: II  TM Distance: >3 FB Neck ROM: Full    Dental no notable dental hx. (+) Dental Advisory Given   Pulmonary neg pulmonary ROS,    Pulmonary exam normal        Cardiovascular hypertension, Pt. on home beta blockers and Pt. on medications Normal cardiovascular exam     Neuro/Psych PSYCHIATRIC DISORDERS Depression Dementia CVA    GI/Hepatic negative GI ROS, Neg liver ROS,   Endo/Other  Hypothyroidism   Renal/GU negative Renal ROS     Musculoskeletal negative musculoskeletal ROS (+)   Abdominal   Peds  Hematology negative hematology ROS (+)   Anesthesia Other Findings Day of surgery medications reviewed with the patient.  Reproductive/Obstetrics                            Anesthesia Physical Anesthesia Plan  ASA: IV  Anesthesia Plan: Spinal   Post-op Pain Management:    Induction:   PONV Risk Score and Plan: 2 and Propofol infusion and Ondansetron  Airway Management Planned: Natural Airway  Additional Equipment:   Intra-op Plan:   Post-operative Plan:   Informed Consent: I have reviewed the patients History and Physical, chart, labs and discussed the procedure including the risks, benefits and alternatives for the proposed anesthesia with the patient or authorized representative who has indicated his/her understanding and acceptance.   Patient has DNR.  Discussed DNR with power of attorney and Suspend DNR.   Dental advisory given  Plan Discussed with: Anesthesiologist and CRNA  Anesthesia Plan Comments:        Anesthesia Quick Evaluation

## 2020-02-07 NOTE — ED Notes (Signed)
2 silver rings and a pearl necklace given to [employer].

## 2020-02-07 NOTE — Progress Notes (Signed)
Patient ID: Julia Carey, female   DOB: 1927/11/17, 86 y.o.   MRN: [id number] I have seen and examined the patient as well as reviewed her x-rays.  She does have a left hip fracture that is a high intertrochanteric fracture.  I have spoken to her nephew who is her healthcare power of attorney.  We discussed in detail her fracture and what surgery involves.  We talked about the interoperative and postoperative course of surgery.  We discussed nonoperative treatment measures versus operative treatment measures.  Given the amount of pain she is having he does wish for her to proceed with surgery to hopefully give her some kind of quality of life with decreased pain and hopefully being able to get into a standing position a wheelchair.  She did walk in her skilled nursing facility prior to this traumatic fall and fracture.

## 2020-02-07 NOTE — ED Notes (Signed)
Pt transported to XR.  

## 2020-02-07 NOTE — ED Provider Notes (Signed)
[facility] DEPT Provider Note   CSN: [id number] Arrival date & time: 02/07/20  [id number]   History Chief Complaint  Patient presents with  . Fall    LAUNDYN Carey is a 86 y.o. female.  The history is provided by the nursing home. The history is limited by the condition of the patient (Dementia).  Fall  She has history of dementia, hypertension and was transferred from a skilled nursing facility following an unwitnessed fall.  Left leg was noted to be shortened.  Patient is nonverbal.  Past Medical History:  Diagnosis Date  . Age-related osteoporosis without current pathological fracture   . Dementia ([place])   . Dementia ([place])   . Dysphagia   . Fracture of first cervical vertebra (HCC)   . Hypothyroidism   . Insomnia   . Major depression, single episode   . Muscle weakness (generalized)     Patient Active Problem List   Diagnosis Date Noted  . Acute metabolic encephalopathy 0000000  . Acute lower UTI 02/19/2019  . Hypothyroidism 09/25/2018  . Dementia ([place]) 09/25/2018  . Fall at home, initial encounter 09/25/2018  . Subarachnoid hemorrhage following injury with brief loss of consciousness but without open intracranial wound ([place]) 09/25/2018  . Accelerated hypertension 09/25/2018  . Muscle weakness (generalized) 09/25/2018  . Subarachnoid hemorrhage ([place]) 09/25/2018    History reviewed. No pertinent surgical history.   OB History   No obstetric history on file.     No family history on file.  Social History   Tobacco Use  . Smoking status: Unknown If Ever Smoked  . Smokeless tobacco: Never Used  Substance Use Topics  . Alcohol use: Not Currently  . Drug use: Not Currently    Home Medications Prior to Admission medications   Medication Sig Start Date End Date Taking? Authorizing Provider  acetaminophen (TYLENOL) 325 MG tablet Take 325 mg by mouth 3 (three) times daily.     [provider]  aspirin 81 MG chewable  tablet Chew 81 mg by mouth daily.    [provider]  azithromycin (ZITHROMAX) 250 MG tablet Take 1 tablet (250 mg total) by mouth daily. 10/08/19   Julia Glass, PA-C  Carboxymeth-Glycerin-Polysorb (REFRESH OPTIVE ADVANCED) 0.5-1-0.5 % SOLN Place 1 drop into both eyes 2 (two) times daily.     [provider]  cetirizine (ZYRTEC) 5 MG tablet Take 5 mg by mouth daily.    [provider]  dexamethasone (DECADRON) 6 MG tablet Take 1 tablet (6 mg total) by mouth daily. 10/08/19   Julia Glass, PA-C  donepezil (ARICEPT) 10 MG tablet Take 10 mg by mouth at bedtime.     [provider]  DULoxetine (CYMBALTA) 30 MG capsule Take 30 mg by mouth daily.    [provider]  Eyelid Cleansers (OCUSOFT EYELID CLEANSING EX) Place 1 application into both eyes daily.     [provider]  fluticasone (FLONASE) 50 MCG/ACT nasal spray Place 1 spray into both nostrils daily.    [provider]  levothyroxine (SYNTHROID) 100 MCG tablet Take 1 tablet (100 mcg total) by mouth daily for 30 days. 02/21/19 04/17/19  Julia August, MD  levothyroxine (SYNTHROID) 112 MCG tablet Take 112 mcg by mouth daily before breakfast.    [provider]  losartan (COZAAR) 100 MG tablet Take 1 tablet (100 mg total) by mouth daily for 30 days. 02/21/19 10/01/19  Julia August, MD  Melatonin 3 MG TABS Take 3 mg by  mouth at bedtime.    [provider]  metoprolol tartrate (LOPRESSOR) 25 MG tablet Take 25 mg by mouth every morning.     [provider]  polyethylene glycol (MIRALAX / GLYCOLAX) packet Take 17 g by mouth daily. MIX INTO 8 OUNCES OF LIQUID    [provider]  PROTEIN PO Take 1 Bottle by mouth 3 (three) times daily after meals.    [provider]  vitamin B-12 (CYANOCOBALAMIN) 1000 MCG tablet Take 1,000 mcg by mouth every other day.     [provider]    Allergies    Amoxicillin, Clindamycin/lincomycin,  and Keflex [cephalexin]  Review of Systems   Review of Systems  Unable to perform ROS: Mental status change    Physical Exam Updated Vital Signs BP (!) 156/82 (BP Location: Left Arm)   Pulse 68   Temp (!) 97.5 F (36.4 C) (Axillary)   Resp 16   SpO2 95%   Physical Exam Vitals and nursing note reviewed.   86 year old female, resting comfortably and in no acute distress. Vital signs are significant for elevated blood pressure. Oxygen saturation is 95%, which is normal. Head is normocephalic and atraumatic. PERRLA, EOMI. Oropharynx is clear. Neck is nontender without adenopathy or JVD. Back is nontender and there is no CVA tenderness. Lungs are clear without rales, wheezes, or rhonchi. Chest is nontender. Heart has regular rate and rhythm without murmur. Abdomen is soft, flat, nontender without masses or hepatosplenomegaly and peristalsis is normoactive. Extremities: Left leg is shortened and externally rotated.  There is tenderness palpation over the left hip.  There is also pain with internal and external rotation of the left hip. Skin is warm and dry without rash. Neurologic: Awake but nonverbal, cranial nerves are intact.  She does not move her left leg, but no other obvious motor deficits.  ED Results / Procedures / Treatments   Labs (all labs ordered are listed, but only abnormal results are displayed) Labs Reviewed  BASIC METABOLIC PANEL - Abnormal; Notable for the following components:      Result Value   Glucose, Bld 119 (*)    BUN 29 (*)    Creatinine, Ser 1.04 (*)    GFR calc non Af Amer 48 (*)    GFR calc Af Amer 55 (*)    All other components within normal limits  CBC WITH DIFFERENTIAL/PLATELET - Abnormal; Notable for the following components:   WBC 15.4 (*)    RBC 3.34 (*)    Hemoglobin 11.0 (*)    HCT 34.2 (*)    MCV 102.4 (*)    Neutro Abs 13.0 (*)    Monocytes Absolute 1.3 (*)    All other components within normal limits  RESPIRATORY PANEL BY RT PCR  (FLU A&B, COVID)  PROTIME-INR  TYPE AND SCREEN  ABO/RH    EKG EKG Interpretation  Date/Time:  Tuesday February 07 2020 04:34:07 EDT Ventricular Rate:  73 PR Interval:    QRS Duration: 99 QT Interval:  435 QTC Calculation: 480 R Axis:   10 Text Interpretation: Sinus rhythm Probable LVH with secondary repol abnrm When compared with ECG of 10/07/2019, No significant change was found Confirmed by Delora Fuel ([id number]) on 02/07/2020 6:30:23 AM   Radiology DG Chest 1 View  Result Date: 02/07/2020 CLINICAL DATA:  Fall fractured hip preop EXAM: CHEST  1 VIEW COMPARISON:  October 07, 2019 FINDINGS: There is stable cardiomegaly. Aortic knob and descending aortic calcifications are seen. There are coarsened  interstitial markings seen throughout both lung bases with hyperinflation. No new airspace consolidation or pleural effusion are seen. Bilateral shoulder osteoarthritis is noted. A dextroconvex scoliotic curvature of the midthoracic spine is seen. IMPRESSION: No active disease. Electronically Signed   By: Prudencio Pair M.D.   On: 02/07/2020 04:30   DG Hip Unilat With Pelvis 2-3 Views Left  Result Date: 02/07/2020 CLINICAL DATA:  Fall, obvious deformity EXAM: DG HIP (WITH OR WITHOUT PELVIS) 2-3V LEFT COMPARISON:  None. FINDINGS: There is a comminuted fracture of the left femoral neck fracture involving the greater trochanter. There is slight lateral angulation of the femoral shaft. The femoral head is still well seated within the acetabulum. IMPRESSION: Comminuted mildly angulated left femoral neck fracture involving the greater trochanter. Electronically Signed   By: Prudencio Pair M.D.   On: 02/07/2020 04:32    Procedures Procedures   Medications Ordered in ED Medications  morphine 2 MG/ML injection 2 mg (2 mg Intravenous Given 02/07/20 0507)    ED Course  I have reviewed the triage vital signs and the nursing notes.  Pertinent labs & imaging results that were available during my care of the  patient were reviewed by me and considered in my medical decision making (see chart for details).  MDM Rules/Calculators/A&P Unwitnessed fall with findings suspicious for left hip fracture.  She arrived with DNR paperwork and MOST form indicating she did not want aggressive medical treatment.  Her son has arrived and confirms those opinions.  Since she would not want cranial surgery, will not get CT of head.  However, no good option for treatment of hip fracture so we will proceed with x-rays and routine labs.  Old records are reviewed, and is noted that she had been admitted for COVID-19 in December, several prior ED visits for falls.  X-ray shows intertrochanteric hip fracture.  Chest x-ray shows no active disease.  ECG is unchanged from prior.  She is starting to look like she is uncomfortable and will be given morphine for pain.  Labs show mild macrocytic anemia and borderline renal insufficiency.  Case is discussed with Dr. Cyd Silence of [facility] hospitalist, who agrees to admit the patient.  Case also discussed with Dr.Dean who agrees to see the patient in consultation for appropriate stabilization of the hip fracture.  Dr. Marlou Sa has reviewed the x-rays and request the patient be transferred to [facility] for surgery later today.  Final Clinical Impression(s) / ED Diagnoses Final diagnoses:  Closed intertrochanteric fracture of left hip, initial encounter ([place])  Macrocytic anemia    Rx / DC Orders ED Discharge Orders    None       Delora Fuel, MD [id number] [phone]

## 2020-02-07 NOTE — Progress Notes (Signed)
[id number], Julia Carey 1430   Ms Carey was transported to the hospital after a fall at [facility] facility. Staff decided to call EMS once Carey could not bear weight on left leg.  Hospice was notified after transport. Ms Carey is under our hospice services with a terminal diagnosis of Alzheimer's Dementia per Dr. Gildardo Cranker with [facility]. She is admitted with a left hip fracture. This is a related admission.   Julia Carey has been admitted to room 1337 and is awaiting palliative surgical intervention by Dr Ninfa Linden with orthopedics. Julia Carey at bedside whom states his aunt has been gradually declining over the last few months. She is currently on pain management   V/S:  BP 183/70, HR 70, RR 16, O2 100 [place] 2L I&O:  ? Labs:  glucose 119, BUN 29, Cre 1.04, GFR 48, WBC 15.4, RBC 3.34, hgb 11, HCT 34.2, MCV 102.4, Neut 13, Monocyte 1.3 Diagnostics:  FINDINGS: There is a comminuted fracture of the left femoral neck fracture involving the greater trochanter. There is slight lateral angulation of the femoral shaft. The femoral head is still well seated within the acetabulum.  IMPRESSION: Comminuted mildly angulated left femoral neck fracture involving the greater trochanter.   Electronically Signed   By: Prudencio Pair M.D.   On: 02/07/2020 04:32   IVs/PRNs:   0.9% NS @75ml /hr Continuous          Vancomycin IVPB 1000 mg  PRN: Morphine 2mg /ml Q2hr           Zofran 4mg  Q6hr           Metoprolol 2.5mg  Q6hr    Problem list:  Comminuted left femoral neck fracture Mechanical fall -Carey presented with a mechanical fall and has comminuted left femoral neck fracture -Orthopedics has been consulted and request transfer to [facility] for surgical intervention today. -NPO.  Pain management.  All precaution.  Will need PT/OT evaluation after surgical intervention  Leukocytosis -Probably reactive.  Monitor  History of advanced  dementia -Monitor for delirium.  Continue donepezil -Carey follows up with palliative care as an outpatient.  If condition worsens during the hospitalization, consider palliative care consultation.  Hypertension -Monitor blood pressure -Continue metoprolol and losartan from tomorrow  Hypothyroidism -Continue Synthroid   D/C planning:  Unsure of D/C plan due to mobility after surgery. Possible return to [place]:  DNR IDT (interdisc team):  updated  Family:  spoke with nephew Julia Carey and reviewed plan of care, updated on  Carey's status.   Clementeen Hoof, BSN, Diplomatic Services operational officer [employer] [facility]) [phone]

## 2020-02-08 ENCOUNTER — Encounter (HOSPITAL_COMMUNITY): Payer: Self-pay | Admitting: Internal Medicine

## 2020-02-08 LAB — BASIC METABOLIC PANEL
Anion gap: 6 (ref 5–15)
BUN: 20 mg/dL (ref 8–23)
CO2: 23 mmol/L (ref 22–32)
Calcium: 9.5 mg/dL (ref 8.9–10.3)
Chloride: 111 mmol/L (ref 98–111)
Creatinine, Ser: 0.89 mg/dL (ref 0.44–1.00)
GFR calc Af Amer: >60 mL/min (ref >60)
GFR calc non Af Amer: 57 mL/min — ABNORMAL LOW (ref >60)
Glucose, Bld: 122 mg/dL — ABNORMAL HIGH (ref 70–99)
Potassium: 4 mmol/L (ref 3.5–5.1)
Sodium: 140 mmol/L (ref 135–145)

## 2020-02-08 LAB — CBC
HCT: 26.1 % — ABNORMAL LOW (ref 36.0–46.0)
Hemoglobin: 8.3 g/dL — ABNORMAL LOW (ref 12.0–15.0)
MCH: 33.3 pg (ref 26.0–34.0)
MCHC: 31.8 g/dL (ref 30.0–36.0)
MCV: 104.8 fL — ABNORMAL HIGH (ref 80.0–100.0)
Platelets: 182 10*3/uL (ref 150–400)
RBC: 2.49 MIL/uL — ABNORMAL LOW (ref 3.87–5.11)
RDW: 12.8 % (ref 11.5–15.5)
WBC: 7.7 10*3/uL (ref 4.0–10.5)
nRBC: 0 % (ref 0.0–0.2)

## 2020-02-08 LAB — HEMOGLOBIN AND HEMATOCRIT, BLOOD
HCT: 24.8 % — ABNORMAL LOW (ref 36.0–46.0)
HCT: 23.5 % — ABNORMAL LOW (ref 36.0–46.0)
Hemoglobin: 8 g/dL — ABNORMAL LOW (ref 12.0–15.0)
Hemoglobin: 7.7 g/dL — ABNORMAL LOW (ref 12.0–15.0)

## 2020-02-08 LAB — MAGNESIUM: Magnesium: 1.9 mg/dL (ref 1.7–2.4)

## 2020-02-08 NOTE — Anesthesia Postprocedure Evaluation (Signed)
Anesthesia Post Note  Patient: Julia Carey  Procedure(s) Performed: INTRAMEDULLARY (IM) NAIL INTERTROCHANTRIC (Left )     Patient location during evaluation: PACU Anesthesia Type: Spinal Level of consciousness: awake and alert Pain management: pain level controlled Vital Signs Assessment: post-procedure vital signs reviewed and stable Respiratory status: spontaneous breathing and respiratory function stable Cardiovascular status: blood pressure returned to baseline and stable Postop Assessment: spinal receding Anesthetic complications: no                  Meggan Dhaliwal DANIEL

## 2020-02-08 NOTE — Discharge Instructions (Signed)
May attempt full weight bearing on left hip. Only needs to be up with assistance. New dressing to left hip as needed.

## 2020-02-08 NOTE — Progress Notes (Signed)
PROGRESS NOTE    Julia Carey    Code Status: DNR  [id number] DOB: Feb 27, 1928 DOA: 02/07/2020 LOS: 1 days  PCP: System, Pcp Not In CC:  Chief Complaint  Patient presents with  . [facility] Summary   An 86 year old female with history of advanced dementia follows with [facility], hypothyroidism, hypertension, osteoporosis who presented from SNF following unwitnessed fall and subsequent left hip pain found to have left intertrochanteric hip fracture on x-ray.  Orthopedics was consulted.  She subsequently underwent left intertrochanteric ORIF with Dr. Ninfa Linden on 02/07/2020  A & P   Principal Problem:   Closed displaced fracture of left femoral neck ([place]) Active Problems:   Hypothyroidism   Dementia ([place])   Hip fracture (HCC)   HTN (hypertension)   Leukocytosis   Closed intertrochanteric fracture of left hip, initial encounter ([place])   1. Left intertrochanteric hip fracture secondary to mechanical fall s/p ORIF with Dr. Ninfa Linden on 02/07/2020, POD 1 a. Comfortable today resolved reactive leukocytosis b. Per Ortho: Patient can return to SNF Ortho perspective c. Discontinue IV fluids to prevent volume overload d. TOC consult to help with placement (ALF with SNF vs SNF)  2. Advanced dementia  a. On hospice care b. Delirium precautions c. Continue donepezil and monitor for delirium  3. Hypertension a. Continue metoprolol b. Can give losartan tomorrow  4. Hypothyroidism on Synthroid  5. Acute on chronic macrocytic anemia likely combination between dilutional and postop anemia a. Hb 11.0 on admission, currently 8.3-> 8.0 b. Trend H/H and monitor for signs of bleeding c. Discontinue fluids and encourage p.o. intake d. Transfuse if Hb less than 7.0  DVT prophylaxis: SCDs Family Communication: Patient's nephew has been updated by surgery Disposition Plan:  Status is: Inpatient  Remains inpatient appropriate because:Unsafe d/c plan   Dispo: The  patient is from: ALF              Anticipated d/c is to: SNF              Anticipated d/c date is: 1 day              Patient currently is not medically stable to d/c.  Needs improved Hb and TOC consult for placement   Pressure injury documentation    None  Consultants  Orthopedic surgery   Procedures  4/27 left hip ORIF   Antibiotics   Anti-infectives (From admission, onward)   Start     Dose/Rate Route Frequency Ordered Stop   02/08/20 0400  vancomycin (VANCOCIN) IVPB 1000 mg/200 mL premix     1,000 mg 200 mL/hr over 60 Minutes Intravenous Every 12 hours 02/07/20 2019 02/08/20 0506   02/07/20 1430  vancomycin (VANCOCIN) IVPB 1000 mg/200 mL premix     1,000 mg 200 mL/hr over 60 Minutes Intravenous On call to O.R. 02/07/20 1421 02/07/20 1711        Subjective   Appears comfortable sitting upright in chair.  Patient is nonverbal due to advanced dementia unable to provide history.  No overnight events  Objective   Vitals:   02/07/20 2205 02/07/20 2306 02/08/20 0421 02/08/20 0932  BP: (!) 160/70 (!) 176/71 140/61 (!) 146/54  Pulse: 92 96 88 92  Resp: 15 18 15 18   Temp: 98.8 F (37.1 C) 98.5 F (36.9 C) 97.7 F (36.5 C) 98.7 F (37.1 C)  TempSrc: Oral Oral    SpO2: 100% 100% 100% 95%  Weight:  Height:        Intake/Output Summary (Last 24 hours) at 02/08/2020 1356 Last data filed at 02/08/2020 1036 Gross per 24 hour  Intake 3285.46 ml  Output 800 ml  Net 2485.46 ml   Filed Weights   02/07/20 2009  Weight: 59 kg    Examination:  Physical Exam Vitals and nursing note reviewed.  Constitutional:      General: She is not in acute distress.    Appearance: She is not toxic-appearing.  Eyes:     Conjunctiva/sclera: Conjunctivae normal.  Cardiovascular:     Rate and Rhythm: Normal rate and regular rhythm.  Pulmonary:     Effort: Pulmonary effort is normal. No respiratory distress.  Neurological:     Mental Status: Mental status is at baseline.    Psychiatric:        Mood and Affect: Mood normal.        Behavior: Behavior normal.     Data Reviewed: I have personally reviewed following labs and imaging studies  CBC: Recent Labs  Lab 02/07/20 0447 02/08/20 0254 02/08/20 0842  WBC 15.4* 7.7  --   NEUTROABS 13.0*  --   --   HGB 11.0* 8.3* 8.0*  HCT 34.2* 26.1* 24.8*  MCV 102.4* 104.8*  --   PLT 250 182  --    Basic Metabolic Panel: Recent Labs  Lab 02/07/20 0447 02/08/20 0254  NA 138 140  K 3.7 4.0  CL 106 111  CO2 24 23  GLUCOSE 119* 122*  BUN 29* 20  CREATININE 1.04* 0.89  CALCIUM 10.2 9.5  MG  --  1.9   GFR: Estimated Creatinine Clearance: 33.9 mL/min (by C-G formula based on SCr of 0.89 mg/dL). Liver Function Tests: No results for input(s): AST, ALT, ALKPHOS, BILITOT, PROT, ALBUMIN in the last 168 hours. No results for input(s): LIPASE, AMYLASE in the last 168 hours. No results for input(s): AMMONIA in the last 168 hours. Coagulation Profile: Recent Labs  Lab 02/07/20 0447  INR 1.0   Cardiac Enzymes: No results for input(s): CKTOTAL, CKMB, CKMBINDEX, TROPONINI in the last 168 hours. BNP (last 3 results) No results for input(s): PROBNP in the last 8760 hours. HbA1C: No results for input(s): HGBA1C in the last 72 hours. CBG: No results for input(s): GLUCAP in the last 168 hours. Lipid Profile: No results for input(s): CHOL, HDL, LDLCALC, TRIG, CHOLHDL, LDLDIRECT in the last 72 hours. Thyroid Function Tests: No results for input(s): TSH, T4TOTAL, FREET4, T3FREE, THYROIDAB in the last 72 hours. Anemia Panel: No results for input(s): VITAMINB12, FOLATE, FERRITIN, TIBC, IRON, RETICCTPCT in the last 72 hours. Sepsis Labs: No results for input(s): PROCALCITON, LATICACIDVEN in the last 168 hours.  Recent Results (from the past 240 hour(s))  Respiratory Panel by RT PCR (Flu A&B, Covid) - Nasopharyngeal Swab     Status: None   Collection Time: 02/07/20  4:47 AM   Specimen: Nasopharyngeal Swab  Result  Value Ref Range Status   SARS Coronavirus 2 by RT PCR NEGATIVE NEGATIVE Final    Comment: (NOTE) SARS-CoV-2 target nucleic acids are NOT DETECTED. The SARS-CoV-2 RNA is generally detectable in upper respiratoy specimens during the acute phase of infection. The lowest concentration of SARS-CoV-2 viral copies this assay can detect is 131 copies/mL. A negative result does not preclude SARS-Cov-2 infection and should not be used as the sole basis for treatment or other patient management decisions. A negative result may occur with  improper specimen collection/handling, submission of specimen other than  nasopharyngeal swab, presence of viral mutation(s) within the areas targeted by this assay, and inadequate number of viral copies (<131 copies/mL). A negative result must be combined with clinical observations, patient history, and epidemiological information. The expected result is Negative. Fact Sheet for Patients:  PinkCheek.be Fact Sheet for Healthcare Providers:  GravelBags.it This test is not yet ap proved or cleared by the [place] FDA and  has been authorized for detection and/or diagnosis of SARS-CoV-2 by FDA under an Emergency Use Authorization (EUA). This EUA will remain  in effect (meaning this test can be used) for the duration of the COVID-19 declaration under Section 564(b)(1) of the Act, 21 U.S.C. section 360bbb-3(b)(1), unless the authorization is terminated or revoked sooner.    Influenza A by PCR NEGATIVE NEGATIVE Final   Influenza B by PCR NEGATIVE NEGATIVE Final    Comment: (NOTE) The Xpert Xpress SARS-CoV-2/FLU/RSV assay is intended as an aid in  the diagnosis of influenza from Nasopharyngeal swab specimens and  should not be used as a sole basis for treatment. Nasal washings and  aspirates are unacceptable for Xpert Xpress SARS-CoV-2/FLU/RSV  testing. Fact Sheet for  Patients: PinkCheek.be Fact Sheet for Healthcare Providers: GravelBags.it This test is not yet approved or cleared by the [place] FDA and  has been authorized for detection and/or diagnosis of SARS-CoV-2 by  FDA under an Emergency Use Authorization (EUA). This EUA will remain  in effect (meaning this test can be used) for the duration of the  Covid-19 declaration under Section 564(b)(1) of the Act, 21  U.S.C. section 360bbb-3(b)(1), unless the authorization is  terminated or revoked. Performed at [facility], [place] [address], [place], [place] [address]   Surgical pcr screen     Status: None   Collection Time: 02/07/20  2:40 PM   Specimen: Nasal Mucosa; Nasal Swab  Result Value Ref Range Status   MRSA, PCR NEGATIVE NEGATIVE Final   Staphylococcus aureus NEGATIVE NEGATIVE Final    Comment: (NOTE) The Xpert SA Assay (FDA approved for NASAL specimens in patients 89 years of age and older), is one component of a comprehensive surveillance program. It is not intended to diagnose infection nor to guide or monitor treatment. Performed at [facility], [place] [address], [place], [place] [address]          Radiology Studies: DG Chest 1 View  Result Date: 02/07/2020 CLINICAL DATA:  Fall fractured hip preop EXAM: CHEST  1 VIEW COMPARISON:  October 07, 2019 FINDINGS: There is stable cardiomegaly. Aortic knob and descending aortic calcifications are seen. There are coarsened interstitial markings seen throughout both lung bases with hyperinflation. No new airspace consolidation or pleural effusion are seen. Bilateral shoulder osteoarthritis is noted. A dextroconvex scoliotic curvature of the midthoracic spine is seen. IMPRESSION: No active disease. Electronically Signed   By: Prudencio Pair M.D.   On: 02/07/2020 04:30   Pelvis Portable  Result Date: 02/07/2020 CLINICAL DATA:  Intramedullary  nail placement for fracture EXAM: PORTABLE PELVIS 1-2 VIEWS COMPARISON:  Preoperative study February 07, 2020 FINDINGS: Frontal view obtained. There is screw and nail fixation through an intertrochanteric femur fracture. The tip of the screw is in the proximal femoral head. Alignment at the fracture site is near anatomic. No new fracture. No dislocation. Soft tissue air on the left is an expected postoperative finding. There is mild symmetric narrowing of each hip joint. IMPRESSION: Postoperative fixation through intertrochanteric femur fracture. Tip of screw and proximal femoral head. No new  fracture. No dislocation. Near anatomic alignment at fracture site on the left. Electronically Signed   By: Lowella Grip III M.D.   On: 02/07/2020 19:29   DG C-Arm 1-60 Min-No Report  Result Date: 02/07/2020 CLINICAL DATA:  Left femoral nail. EXAM: LEFT FEMUR 2 VIEWS; DG C-ARM 1-60 MIN-NO REPORT COMPARISON:  Preoperative hip radiograph earlier today. FINDINGS: Seven fluoroscopic spot views obtained in the operating room. Femoral nail with trans trochanteric screw traverses proximal femur fracture. The fracture is in improved alignment compared to preoperative imaging. Total dose 12.89 mGy. IMPRESSION: Procedural fluoroscopy during left femur fracture ORIF. Electronically Signed   By: Keith Rake M.D.   On: 02/07/2020 19:13   DG Hip Unilat With Pelvis 2-3 Views Left  Result Date: 02/07/2020 CLINICAL DATA:  Fall, obvious deformity EXAM: DG HIP (WITH OR WITHOUT PELVIS) 2-3V LEFT COMPARISON:  None. FINDINGS: There is a comminuted fracture of the left femoral neck fracture involving the greater trochanter. There is slight lateral angulation of the femoral shaft. The femoral head is still well seated within the acetabulum. IMPRESSION: Comminuted mildly angulated left femoral neck fracture involving the greater trochanter. Electronically Signed   By: Prudencio Pair M.D.   On: 02/07/2020 04:32   DG FEMUR MIN 2 VIEWS  LEFT  Result Date: 02/07/2020 CLINICAL DATA:  Left femoral nail. EXAM: LEFT FEMUR 2 VIEWS; DG C-ARM 1-60 MIN-NO REPORT COMPARISON:  Preoperative hip radiograph earlier today. FINDINGS: Seven fluoroscopic spot views obtained in the operating room. Femoral nail with trans trochanteric screw traverses proximal femur fracture. The fracture is in improved alignment compared to preoperative imaging. Total dose 12.89 mGy. IMPRESSION: Procedural fluoroscopy during left femur fracture ORIF. Electronically Signed   By: Keith Rake M.D.   On: 02/07/2020 19:13        Scheduled Meds: . aspirin EC  325 mg Oral Q breakfast  . docusate sodium  100 mg Oral BID  . donepezil  10 mg Oral QHS  . DULoxetine  30 mg Oral Daily  . fluticasone  1 spray Each Nare Daily  . levothyroxine  125 mcg Oral QAC breakfast  . loratadine  10 mg Oral Daily  . losartan  100 mg Oral Daily  . metoprolol tartrate  12.5 mg Oral Daily  . polyvinyl alcohol  1 drop Both Eyes BID   Continuous Infusions: . sodium chloride 75 mL/hr at 02/08/20 0600     Time spent: 26 minutes with over 50% of the time coordinating the patient's care    Harold Hedge, DO [facility] Pager [phone]  Call night coverage person covering after 7pm

## 2020-02-08 NOTE — TOC Progression Note (Deleted)
Transition of Care [facility]) - Progression Note    Patient Details  Name: Julia Carey MRN: [id number] Date of Birth: 03-Feb-1928  Transition of Care [facility] [place] CM/SW [place], [place] Phone Number: 02/08/2020, 2:07 PM  Clinical Narrative: Patient has hx of Dementia admitted for fall, and hip fracture. Patient underwent surgical intervention.     Patient is currently active with [facility], an RN provide care to the patient about 3 times a week at the facility [facility] where the patient resides in [place]. CSW reached out to [facility] to learn the patient level of functioning prior to her fall. CSW spoke with Med Tech. Supervisor Public house manager of resident [employer], per staff, the patient could walk but was supervised for transfers into a wheelchair and sometime would do it her own. The facility staff supervised the patient while feeding herself.   The Director reports they will accept the patient back as long as she is weightbearing as tolerated.   CSW faxed clinical information to:[phone] for review.     CSW reached out to the patient LG/nephew [name] to discuss therapy findings and discuss the discharge plan. Per nephew,  He pays privately for the patient to have physical therapy 2 times a week for about thirty minutes a day and will up her services if needed.    CSW will meet with the nephew around 3:00pm today.     Barriers to Discharge: Continued Medical Work up  Expected Discharge Plan and Services   In-house Referral: Hospice / Palliative Care Discharge Planning Services: CM Consult Post Acute Care Choice: Nursing Home Living arrangements for the past 2 months: (Memory Care)                 DME Arranged: N/A DME Agency: NA                   Social Determinants of Health (SDOH) Interventions    Readmission Risk Interventions No flowsheet data found.

## 2020-02-08 NOTE — Evaluation (Signed)
Physical Therapy Evaluation Patient Details Name: Julia Carey MRN: [id number] DOB: 01/04/1928 Today's Date: 02/08/2020   History of Present Illness  Pt is an 86 year old female s/p Open reduction internal fixation of left intertrochanteric hip fracture with PMHx significant for dementia, HTN  Clinical Impression  Pt admitted with above diagnosis.  Pt currently with functional limitations due to the deficits listed below (see PT Problem List). Pt will benefit from skilled PT to increase their independence and safety with mobility to allow discharge to the venue listed below.  Pt remained nonverbal during session however attempting to assist with mobility as able.  Pt currently at least mod assist for transfers at this time.  Recommend SNF if ALF unable to provide current assist level.      Follow Up Recommendations SNF    Equipment Recommendations  None recommended by PT    Recommendations for Other Services       Precautions / Restrictions Precautions Precautions: Fall Restrictions Weight Bearing Restrictions: No Other Position/Activity Restrictions: WBAT      Mobility  Bed Mobility Overal bed mobility: Needs Assistance Bed Mobility: Supine to Sit     Supine to sit: Mod assist     General bed mobility comments: pt initiating movement however required assist to scoot to EOB and bring trunk upright  Transfers Overall transfer level: Needs assistance Equipment used: Rolling walker (2 wheeled) Transfers: Sit to/from [facility] Sit to Stand: Mod assist;+2 safety/equipment Stand pivot transfers: +2 safety/equipment;Mod assist       General transfer comment: multimodal cues for technique, pt requiring assist to rise and steady, posterior lean present throughout standing requiring mod assist for stability  Ambulation/Gait                Stairs            Wheelchair Mobility    Modified Rankin (Stroke Patients Only)       Balance                                              Pertinent Vitals/Pain Pain Assessment: Faces Faces Pain Scale: Hurts little more Pain Location: left hip Pain Descriptors / Indicators: Grimacing Pain Intervention(s): Monitored during session;Repositioned    Home Living Family/patient expects to be discharged to:: Assisted living                 Additional Comments: memory care unit    Prior Function Level of Independence: Needs assistance   Gait / Transfers Assistance Needed: uses RW for ambulation per chart review           Hand Dominance        Extremity/Trunk Assessment        Lower Extremity Assessment Lower Extremity Assessment: Generalized weakness;LLE deficits/detail;Difficult to assess due to impaired cognition LLE Deficits / Details: assist for L LE during mobility       Communication   Communication: HOH  Cognition Arousal/Alertness: Awake/alert Behavior During Therapy: WFL for tasks assessed/performed Overall Cognitive Status: History of cognitive impairments - at baseline                                 General Comments: advanced dementia, pt nonverbal however able to follow simple commands      General Comments  Exercises     Assessment/Plan    PT Assessment Patient needs continued PT services  PT Problem List Decreased balance;Decreased activity tolerance;Decreased strength;Decreased mobility;Decreased cognition;Decreased knowledge of use of DME       PT Treatment Interventions DME instruction;Gait training;Therapeutic exercise;Therapeutic activities;Patient/family education;Functional mobility training;Balance training;Wheelchair mobility training    PT Goals (Current goals can be found in the Care Plan section)  Acute Rehab PT Goals PT Goal Formulation: Patient unable to participate in goal setting Time For Goal Achievement: 02/22/20 Potential to Achieve Goals: Good    Frequency Min 2X/week    Barriers to discharge        Co-evaluation               AM-PAC PT "6 Clicks" Mobility  Outcome Measure Help needed turning from your back to your side while in a flat bed without using bedrails?: A Lot Help needed moving from lying on your back to sitting on the side of a flat bed without using bedrails?: A Lot Help needed moving to and from a bed to a chair (including a wheelchair)?: A Lot Help needed standing up from a chair using your arms (e.g., wheelchair or bedside chair)?: A Lot Help needed to walk in hospital room?: Total Help needed climbing 3-5 steps with a railing? : Total 6 Click Score: 10    End of Session Equipment Utilized During Treatment: Gait belt Activity Tolerance: Patient tolerated treatment well Patient left: in chair;with call bell/phone within reach;with chair alarm set Nurse Communication: Mobility status PT Visit Diagnosis: Unsteadiness on feet (R26.81);Muscle weakness (generalized) (M62.81)    Time: [id number] PT Time Calculation (min) (ACUTE ONLY): 20 min   Charges:   PT Evaluation $PT Eval Low Complexity: 1 Low     Kati PT, DPT Acute Rehabilitation Services Office: [phone]  Trena Platt 02/08/2020, 12:59 PM

## 2020-02-08 NOTE — Op Note (Signed)
NAME: Julia Carey, SIDES MEDICAL RECORD [id number] ACCOUNT [id number] DATE OF BIRTH:[birth year] F[facility]ILITY[facility] LOCATION: [facility] PHYSICIAN:Mirage Pfefferkorn [place], MD  OPERATIVE REPORT  DATE OF PROCEDURE:  02/07/2020  PREOPERATIVE DIAGNOSIS:  Comminuted displaced left hip intertrochanteric fracture.  POSTOPERATIVE DIAGNOSIS:  Comminuted displaced left hip intertrochanteric fracture.  PROCEDURE:  Open reduction internal fixation of left intertrochanteric hip fracture.  IMPLANTS:  Biomet-Zimmer 9 x 360 femoral nail with a 100 mm lag screw.  SURGEON:  Lind Guest. Ninfa Linden, MD  ASSISTANT:  Erskine Emery, PA-C  ANESTHESIA:  Spinal.  ANTIBIOTICS:  2 grams IV Ancef.  ESTIMATED BLOOD LOSS:  100 mL.  COMPLICATIONS:  None.  INDICATIONS:  The patient is a very pleasant 86 year old female who does have dementia and lives in a skilled nursing facility.  She has been under long-term hospice care.  She does ambulate.  She sustained a mechanical fall last night and was seen in  the [facility] Emergency Room and found to have a displaced intertrochanteric hip fracture.  She has been seen and cleared by the medical service for surgery.  I have talked at length in detail to her nephew who is her healthcare power of attorney.   Given the fact that she is having severe pain and does ambulate he does not wish to see her in this type of pain and would like [place] to proceed with a surgical intervention to allow her to hopefully have some kind of quality of life and mobility.  I agree  with this as well based on examining her and her x-rays and getting a feel for what is going on with her clinically and psychosocially.  He does understand fully that there is risks of this surgery given her age and one of these is perioperative death.   He also understands the risk of acute blood loss anemia, nerve or vessel injury.  DESCRIPTION OF PROCEDURE:  After informed consent was obtained appropriate left hip was  marked.  She was brought to the operating room where spinal anesthesia was obtained while she was on a stretcher.  Foley catheter was placed and then she was placed  supine on the fracture table with her left operative leg in inline skeletal traction, a perineal post in place, and her right leg in a well leg holder with abduction and flexion out of the field with appropriate padding in the popliteal area and around  the peroneal nerve.  We then assessed her left hip radiographically and we were able to get the fracture in adequately reduced position with traction and internal rotation.  We then chose our 9 x 360 femoral nail keeping it in the box holding it over the  leg sterilely so we could then feel good about our diameter of the nail since we are not going to ream, as well as length of the nail.  This was then passed off sterilely to the back table so an outrigger guide could be set up to that.  Her left hip was  then prepped and draped with DuraPrep and sterile drapes.  A time-out was called.  She was identified as correct patient, correct left hip.  I then made an incision just proximal to the greater trochanter along the lateral thigh.  We dissected down to  the greater trochanter and then we were able to place a temporary guide pin in an antegrade fashion from the tip of the greater trochanter down to past the lesser trochanter.  We verified its placement on AP and  lateral planes and then using initiating  reamer to open the femoral canal.  We then removed the guide pin and we were able to easily place our 9 x 360 femoral nail down the femoral canal.  We verified its place under direct fluoroscopy.  I then made a separate lateral incision using the  outrigger guide and placed a temporary guide pin from the lateral cortex of the femur traversing the fracture into a center-center position in the femoral head.  I did use a temporary pin as an anti-rotation screw as well.  We then took a measurement off   of the 1st pin and chose a 100 mm lag screw.  We drilled to my depth of that lag screw and then placed the lag screw without difficulty.  We then removed the temporary pins including the anti-rotation pin.  We then compressed the fracture easily.  We  were pleased with the reduction putting the hip through internal and external rotation and verifying its placement and fracture reduction under direct fluoroscopy.  We then removed all instrumentation from the hip.  We closed the 2 small incisions with  interrupted 0 Vicryl in the deep tissue followed by 2-0 Vicryl subcutaneous tissue and interrupted staples on the skin.  A well-padded sterile dressing was applied.  Of note, we did irrigate the wounds as well.  She was then taken off the [place] table and  taken to recovery room in stable condition.  All final counts were correct.  There were no complications noted.  Of note, Benita Stabile, PA-C's assistance was crucial for facilitating all aspects of this case.  CN/NUANCE  D:02/07/2020 T:02/08/2020 JOB:[id number]

## 2020-02-08 NOTE — TOC Initial Note (Addendum)
Transition of Care [facility]) - Initial/Assessment Note    Patient Details  Name: Julia Carey MRN: [id number] Date of Birth: Aug 21, 1928  Transition of Care [facility]) CM/SW Contact:    Lia Hopping, [place] Phone Number: 02/08/2020, 2:39 PM  Clinical Narrative:                 Patient has hx of Dementia admitted for fall, and hip fracture. Patient underwent surgical intervention.     Patient is currently active with [facility], an RN provide care to the patient about 3 times a week at the facility [facility] where the patient resides in [place]. CSW reached out to [facility] to learn the patient level of functioning prior to her fall. CSW spoke with Med Tech. Supervisor Public house manager of resident [employer], per staff, the patient could walk but was supervised for transfers into a wheelchair and sometime would do it her own. The facility staff supervised the patient while feeding herself.   The Director reports they will accept the patient back as long as she is weightbearing as tolerated.   CSW faxed clinical information to:[phone] for review.     CSW reached out to the patient LG/nephew [name] to discuss therapy findings and discuss the discharge plan. Per nephew,  He pays privately for the patient to have physical therapy 2 times a week for about thirty minutes a day and will up her services if needed.     CSW provide an update to [facility] with [facility]     CSW will meet with the nephew around 3:00pm today.   Patient nephew discussed calling the facility to determine if it's possible to move the patient room closer to the nurses station so the patient can be monitored closer.  Plan: Follow up with Director of Resident Care coordinate discharge, if patient is medically stable.     Barriers to Discharge: Continued Medical Work up   Patient Goals and CMS Choice Patient states their goals for this hospitalization and ongoing recovery are:: legal  Guardian, " I need guidance on what will be best for her at this point." CMS Medicare.gov Compare Post Acute Care list provided to:: Patient Choice offered to / list presented to : Patient  Expected Discharge Plan and Services   In-house Referral: Hospice / Palliative Care Discharge Planning Services: CM Consult Post Acute Care Choice: Nursing Home Living arrangements for the past 2 months: (Memory Care)                 DME Arranged: N/A DME Agency: NA                  Prior Living Arrangements/Services Living arrangements for the past 2 months: (Memory Care) Lives with:: Facility Resident Patient language and need for interpreter reviewed:: No Do you feel safe going back to the place where you live?: Yes      Need for Family Participation in Patient Care: Yes (Comment) Care giver support system in place?: Yes (comment) Current home services: Hospice Criminal Activity/Legal Involvement Pertinent to Current Situation/Hospitalization: No - Comment as needed  Activities of Daily Living Home Assistive Devices/Equipment: Grab bars around toilet, Grab bars in shower, Hand-held shower hose, Eyeglasses, Walker (specify type), Blood pressure cuff, Scales ADL Screening (condition at time of admission) Patient's cognitive ability adequate to safely complete daily activities?: No Is the patient deaf or have difficulty hearing?: Yes Does the patient have difficulty seeing, even when wearing glasses/contacts?: No  Does the patient have difficulty concentrating, remembering, or making decisions?: Yes Patient able to express need for assistance with ADLs?: Yes Does the patient have difficulty dressing or bathing?: Yes Independently performs ADLs?: No Communication: Independent Dressing (OT): Needs assistance Is this a change from baseline?: Pre-admission baseline Grooming: Needs assistance Is this a change from baseline?: Pre-admission baseline Feeding: Needs assistance Is this a  change from baseline?: Pre-admission baseline Bathing: Needs assistance Is this a change from baseline?: Pre-admission baseline Toileting: Dependent Is this a change from baseline?: Change from baseline, expected to last >3days In/Out Bed: Dependent Is this a change from baseline?: Change from baseline, expected to last >3 days Walks in Home: Dependent Is this a change from baseline?: Change from baseline, expected to last >3 days Does the patient have difficulty walking or climbing stairs?: Yes Weakness of Legs: Left Weakness of Arms/Hands: None  Permission Sought/Granted Permission sought to share information with : Case Manager, Family Supports       Permission granted to share info w AGENCY: [facility] at [employer] granted to share info w Relationship: Legal Guardian-Nephew [place] granted to share info w Contact Information: [phone]  Emotional Assessment Appearance:: Appears stated age Attitude/Demeanor/Rapport: Unable to Assess Affect (typically observed): Unable to Assess   Alcohol / Substance Use: Not Applicable Psych Involvement: No (comment)  Admission diagnosis:  Hip fracture ([place]) [S72.009A] Macrocytic anemia [D53.9] Closed intertrochanteric fracture of left hip, initial encounter ([place]) [S72.142A] Closed displaced fracture of left femoral neck ([place]) [S72.002A] Patient Active Problem List   Diagnosis Date Noted  . Closed displaced fracture of left femoral neck ([place]) 02/07/2020  . Hip fracture ([place]) 02/07/2020  . HTN (hypertension) 02/07/2020  . Leukocytosis 02/07/2020  . Closed intertrochanteric fracture of left hip, initial encounter ([place])   . Acute metabolic encephalopathy 0000000  . Acute lower UTI 02/19/2019  . Hypothyroidism 09/25/2018  . Dementia ([place]) 09/25/2018  . Fall at home, initial encounter 09/25/2018  . Subarachnoid hemorrhage following injury with brief loss of consciousness but without open intracranial wound  ([place]) 09/25/2018  . Accelerated hypertension 09/25/2018  . Muscle weakness (generalized) 09/25/2018  . Subarachnoid hemorrhage ([place]) 09/25/2018   PCP:  System, Pcp Not In Pharmacy:  No Pharmacies Listed    Social Determinants of Health (SDOH) Interventions    Readmission Risk Interventions No flowsheet data found.

## 2020-02-08 NOTE — Progress Notes (Signed)
Patient ID: Julia Carey, female   DOB: [birth year], 86 y.o.   MRN: [id number] The patient tolerated her left hip surgery well late yesterday.  She is sitting up in her chair appearing comfortable right now.  Her left hip is stable.  I did print out x-rays for her nephew.  She can return to her skilled nursing from Ortho's perspective.

## 2020-02-08 NOTE — Progress Notes (Signed)
Julia Carey, Julia Carey Patient 1430   Julia Carey was transported to the hospital after a fall at [facility] facility. Staff decided to call EMS once patient could not bear weight on left leg.  Hospice was notified after transport. Julia Carey is under our hospice services with a terminal diagnosis of Alzheimer's Dementia per Dr. Gildardo Cranker with [facility]. She is admitted with a left hip fracture. This is a related admission.   Visited Julia Carey at bedside she is post op day 1 from hip surgery, she had no complaints and was able to work with PT today. I spoke with POA nephew Julia Carey and updated him on patients status and D/C plans according to [facility].   V/S: BP 162/63, HR 81, RR 15, O2 94 RA I&O: 716.4 Labs: glucose 122, GFR 57, RBC 2.49, hgb 8, HCT 24.8, MCV 104.8 Diagnostics:  FINDINGS: There is a comminuted fracture of the left femoral neck fracture involving the greater trochanter. There is slight lateral angulation of the femoral shaft. The femoral head is still well seated within the acetabulum.  IMPRESSION: Comminuted mildly angulated left femoral neck fracture involving the greater trochanter.   Electronically Signed By: Prudencio Pair M.D. On: 02/07/2020 04:32   IVs/PRNs:                        PRN: Morphine 2mg /ml Q2hr           Hydrocodone(Norco) 7.5-325mg            Metoprolol 2.5mg  Q6hr  Problem list: Assessment/Plan  Comminuted left femoral neck fracture Mechanical fall -Patient presented with a mechanical fall and has comminuted left femoral neck fracture -Orthopedics has been consulted and request transfer to [facility] for surgical intervention today. -NPO.  Pain management.  All precaution.  Will need PT/OT evaluation after surgical intervention  Leukocytosis -Probably reactive.  Monitor  History of advanced dementia -Monitor for delirium.  Continue donepezil -Patient follows up with palliative care as an  outpatient.  If condition worsens during the hospitalization, consider palliative care consultation.  Hypertension -Monitor blood pressure -Continue metoprolol and losartan from tomorrow  Hypothyroidism -Continue Synthroid  D/C planning:  Possible return to [place]: DNR IDT (interdisc team): updated  Family: spoke with nephew Julia Carey and reviewed plan of care, updated on  patient's status.   Clementeen Hoof, BSN, Diplomatic Services operational officer [facility]) [phone]

## 2020-02-09 ENCOUNTER — Encounter: Payer: Self-pay | Admitting: *Deleted

## 2020-02-09 LAB — SARS CORONAVIRUS 2 (TAT 6-24 HRS): SARS Coronavirus 2: NEGATIVE

## 2020-02-09 LAB — CBC
HCT: 23.7 % — ABNORMAL LOW (ref 36.0–46.0)
Hemoglobin: 7.6 g/dL — ABNORMAL LOW (ref 12.0–15.0)
MCH: 32.8 pg (ref 26.0–34.0)
MCHC: 32.1 g/dL (ref 30.0–36.0)
MCV: 102.2 fL — ABNORMAL HIGH (ref 80.0–100.0)
Platelets: 185 10*3/uL (ref 150–400)
RBC: 2.32 MIL/uL — ABNORMAL LOW (ref 3.87–5.11)
RDW: 12.7 % (ref 11.5–15.5)
WBC: 8.4 10*3/uL (ref 4.0–10.5)
nRBC: 0 % (ref 0.0–0.2)

## 2020-02-09 LAB — BASIC METABOLIC PANEL
Anion gap: 6 (ref 5–15)
BUN: 16 mg/dL (ref 8–23)
CO2: 22 mmol/L (ref 22–32)
Calcium: 9.7 mg/dL (ref 8.9–10.3)
Chloride: 109 mmol/L (ref 98–111)
Creatinine, Ser: 1.08 mg/dL — ABNORMAL HIGH (ref 0.44–1.00)
GFR calc Af Amer: 53 mL/min — ABNORMAL LOW (ref >60)
GFR calc non Af Amer: 45 mL/min — ABNORMAL LOW (ref >60)
Glucose, Bld: 121 mg/dL — ABNORMAL HIGH (ref 70–99)
Potassium: 3.6 mmol/L (ref 3.5–5.1)
Sodium: 137 mmol/L (ref 135–145)

## 2020-02-09 LAB — HEMOGLOBIN AND HEMATOCRIT, BLOOD
HCT: 23.8 % — ABNORMAL LOW (ref 36.0–46.0)
HCT: 24.1 % — ABNORMAL LOW (ref 36.0–46.0)
Hemoglobin: 7.6 g/dL — ABNORMAL LOW (ref 12.0–15.0)
Hemoglobin: 7.8 g/dL — ABNORMAL LOW (ref 12.0–15.0)

## 2020-02-09 LAB — OCCULT BLOOD X 1 CARD TO LAB, STOOL: Fecal Occult Bld: NEGATIVE

## 2020-02-09 NOTE — Progress Notes (Signed)
[id number], [place] Patient RN Visit    Julia Carey was transported to the hospital after a fall at [facility] facility. Staff decided to call EMS once patient could not bear weight on left leg.  Hospice was notified after transport. Julia Carey is under our hospice services with a terminal diagnosis of Alzheimer's Dementia per Dr. Gildardo Cranker with [facility]. She is admitted with a left hip fracture. This is a related admission.  This RN visited Julia Carey at bedside on post op day 2 from hip surgery. Pt denied pain, NAD noted.  Bedside RN reports pt tolerated PT well today and has been able to transfer to recliner with two person assist.   This RN spoke with POA nephew [name] to discuss pt's progress and plan to DC pt to [facility] tomorrow per [facility] who verbalized understanding and agreement. No new concerns at this time.  VS:  97.85F oral, 148/67, 91 HR, 20 RR, 98% RA I&O's: net 420 Labs:  Creatinine  1.08             GFR  45             HGB  7.6  (of note: fecal occult neg)  Diagnostics:  FINDINGS:There is a comminuted fracture of the left femoral neck fractureinvolving the greater trochanter. There is slight lateral angulationof the femoral shaft. The femoral head is still well seated withinthe acetabulum.  IMPRESSION:  Comminuted mildly angulated left femoral neck fracture involving the greater trochanter.     Electronically Signed  By: Prudencio Pair M.D.     On: 02/07/2020 04:32   From H&P:   An 86 year old female with history of advanced dementia follows with [facility], hypothyroidism, hypertension, osteoporosis who presented from SNF following unwitnessed fall and subsequent left hip pain found to have left intertrochanteric hip fracture on x-ray.  Orthopedics was consulted.  She subsequently underwent left intertrochanteric ORIF with Dr. Ninfa Linden on 02/07/2020  Principal Problem:   Closed displaced fracture of left femoral neck ([place]) Active Problems:  Hypothyroidism   Dementia (HCC)   Hip fracture (HCC)   HTN (hypertension)   Leukocytosis   Closed intertrochanteric fracture of left hip, initial encounter ([place])    Left intertrochanteric hip fracture secondary to mechanical fall s/p ORIF with Dr. Ninfa Linden on 02/07/2020, POD 2 a. Comfortable today resolved reactive leukocytosis b. Per Ortho: Patient can return to SNF from Ortho perspective c. Plan for DC back to ALF with PT services tomorrow, pending COVID 19 test   Acute on chronic macrocytic anemia likely combination between dilutional and postop anemia a. Hb 11.0 on admission, currently 8.3-> 7.6 b. No obvious source of bleed c. Hemodynamically stable on RA d. Trend H/H and monitor for signs of bleeding e. Transfuse if Hb less than 7.0   DVT prophylaxis: SCDs Family Communication: no family at bedside Disposition Plan:  Status is: Inpatient   Remains inpatient appropriate because:Unsafe d/c plan    Dispo: The patient is from: ALF              Anticipated d/c is to: SNF              Anticipated d/c date is: 1 day              Patient currently is not medically stable to d/c.  Can likely discharge tomorrow back to ALF with PT services pending Covid test and improved hemoglobin    Goal of care remains comfort care only, DNR.  Should pt need ambulance transfer at dc, please use [facility] as they contract this service for our Hospice pts.  Thank you. Domenic Moras, BSN, [facility] (in [place]) [employer]  [phone] (24h on call)

## 2020-02-09 NOTE — TOC Progression Note (Signed)
Transition of Care [facility]) - Progression Note    Patient Details  Name: Julia Carey MRN: [id number] Date of Birth: 01/21/1928  [facility] CM/SW [place], LCSW Phone Number: 02/09/2020, 11:17 AM  Clinical Narrative:    CSW received call from facility Director of Resident Care-Malinda. After reviewing the patient physical therapy notes, she is concerned the patient will not get skilled therapy at the facility. She  prefers the patient go if the patient nephew is agreeable. CSW reached out to the patient nephew and explain facility recommendation. Patient nephew express concerns if rehab will be beneficial to the patient at this point. He plans to reach out to [facility] to discuss.   CSW reached out to Trixie Dredge to discuss findings. She will reach out to [facility] facility liaison to provide updates.   UPDATE: Nephew discussed plan of care with Director Cinda [place] and Therapist [place].  The patient will return to facility, up therapy sessions for patient, and move her room closer to the nurses station.      Barriers to Discharge: Continued Medical Work up  Expected Discharge Plan and Services   In-house Referral: Hospice / Palliative Care Discharge Planning Services: CM Consult Post Acute Care Choice: Nursing Home Living arrangements for the past 2 months: (Memory Care)                 DME Arranged: N/A DME Agency: NA                   Social Determinants of Health (SDOH) Interventions    Readmission Risk Interventions No flowsheet data found.

## 2020-02-09 NOTE — Progress Notes (Signed)
PROGRESS NOTE    Julia Carey    Code Status: DNR  [id number] DOB: [birth year] DOA: 02/07/2020 LOS: 2 days  PCP: System, Pcp Not In CC:  Chief Complaint  Patient presents with  . [facility] Summary   An 86 year old female with history of advanced dementia follows with [facility], hypothyroidism, hypertension, osteoporosis who presented from SNF following unwitnessed fall and subsequent left hip pain found to have left intertrochanteric hip fracture on x-ray.  Orthopedics was consulted.  She subsequently underwent left intertrochanteric ORIF with Dr. Ninfa Linden on 02/07/2020  A & P   Principal Problem:   Closed displaced fracture of left femoral neck ([place]) Active Problems:   Hypothyroidism   Dementia (Julia Carey)   Hip fracture (HCC)   HTN (hypertension)   Leukocytosis   Closed intertrochanteric fracture of left hip, initial encounter ([place])   1. Left intertrochanteric hip fracture secondary to mechanical fall s/p ORIF with Dr. Ninfa Linden on 02/07/2020, POD 2 a. Comfortable today resolved reactive leukocytosis b. Per Ortho: Patient can return to SNF from Ortho perspective c. Plan for DC back to ALF with PT services tomorrow, pending COVID 19 test  2. Advanced dementia  a. On hospice care b. Delirium precautions c. Continue donepezil and monitor for delirium  3. Hypertension a. Continue metoprolol b. Can give losartan tomorrow  4. Hypothyroidism on Synthroid  5. Acute on chronic macrocytic anemia likely combination between dilutional and postop anemia a. Hb 11.0 on admission, currently 8.3-> 7.6 b. No obvious source of bleed c. Hemodynamically stable on RA d. Trend H/H and monitor for signs of bleeding e. Transfuse if Hb less than 7.0  DVT prophylaxis: SCDs Family Communication: no family at bedside Disposition Plan:  Status is: Inpatient  Remains inpatient appropriate because:Unsafe d/c plan   Dispo: The patient is from: ALF              Anticipated  d/c is to: SNF              Anticipated d/c date is: 1 day              Patient currently is not medically stable to d/c.  Can likely discharge tomorrow back to ALF with PT services pending Covid test and improved hemoglobin   Pressure injury documentation    None  Consultants  Orthopedic surgery   Procedures  4/27 left hip ORIF   Antibiotics   Anti-infectives (From admission, onward)   Start     Dose/Rate Route Frequency Ordered Stop   02/08/20 0400  vancomycin (VANCOCIN) IVPB 1000 mg/200 mL premix     1,000 mg 200 mL/hr over 60 Minutes Intravenous Every 12 hours 02/07/20 2019 02/08/20 0506   02/07/20 1430  vancomycin (VANCOCIN) IVPB 1000 mg/200 mL premix     1,000 mg 200 mL/hr over 60 Minutes Intravenous On call to O.R. 02/07/20 1421 02/07/20 1711        Subjective   Patient nodding head when asked if she feels okay and shakes head no if asked of any issues.  She is a poor historian given her advanced dementia nonverbal  Objective   Vitals:   02/08/20 1856 02/08/20 2202 02/09/20 0555 02/09/20 1332  BP: (!) 153/75 (!) 152/56 (!) 162/71 (!) 163/67  Pulse: 95 78 82 71  Resp: 18 20 16 16   Temp: 98.8 F (37.1 C) 99.4 F (37.4 C) 98.6 F (37 C) 97.8 F (36.6 C)  TempSrc: Oral Axillary Oral  Oral  SpO2: 95% 93% 95% (!) 79%  Weight:      Height:        Intake/Output Summary (Last 24 hours) at 02/09/2020 1540 Last data filed at 02/09/2020 1332 Gross per 24 hour  Intake 600 ml  Output 1200 ml  Net -600 ml   Filed Weights   02/07/20 2009  Weight: 59 kg    Examination:  Physical Exam Vitals and nursing note reviewed.  Constitutional:      General: She is not in acute distress. HENT:     Head: Normocephalic.  Eyes:     Conjunctiva/sclera: Conjunctivae normal.  Cardiovascular:     Rate and Rhythm: Normal rate and regular rhythm.  Pulmonary:     Effort: Pulmonary effort is normal.  Musculoskeletal:     Comments: Postoperative dressing on left hip without  ecchymoses or significant swelling  Neurological:     Mental Status: She is alert. Mental status is at baseline.     Data Reviewed: I have personally reviewed following labs and imaging studies  CBC: Recent Labs  Lab 02/07/20 0447 02/08/20 0254 02/08/20 0842 02/08/20 2024 02/09/20 0320  WBC 15.4* 7.7  --   --  8.4  NEUTROABS 13.0*  --   --   --   --   HGB 11.0* 8.3* 8.0* 7.7* 7.6*  HCT 34.2* 26.1* 24.8* 23.5* 23.7*  MCV 102.4* 104.8*  --   --  102.2*  PLT 250 182  --   --  [id number]   Basic Metabolic Panel: Recent Labs  Lab 02/07/20 0447 02/08/20 0254 02/09/20 0320  NA 138 140 137  K 3.7 4.0 3.6  CL 106 111 109  CO2 24 23 22   GLUCOSE 119* 122* 121*  BUN 29* 20 16  CREATININE 1.04* 0.89 1.08*  CALCIUM 10.2 9.5 9.7  MG  --  1.9  --    GFR: Estimated Creatinine Clearance: 27.9 mL/min (A) (by C-G formula based on SCr of 1.08 mg/dL (H)). Liver Function Tests: No results for input(s): AST, ALT, ALKPHOS, BILITOT, PROT, ALBUMIN in the last 168 hours. No results for input(s): LIPASE, AMYLASE in the last 168 hours. No results for input(s): AMMONIA in the last 168 hours. Coagulation Profile: Recent Labs  Lab 02/07/20 0447  INR 1.0   Cardiac Enzymes: No results for input(s): CKTOTAL, CKMB, CKMBINDEX, TROPONINI in the last 168 hours. BNP (last 3 results) No results for input(s): PROBNP in the last 8760 hours. HbA1C: No results for input(s): HGBA1C in the last 72 hours. CBG: No results for input(s): GLUCAP in the last 168 hours. Lipid Profile: No results for input(s): CHOL, HDL, LDLCALC, TRIG, CHOLHDL, LDLDIRECT in the last 72 hours. Thyroid Function Tests: No results for input(s): TSH, T4TOTAL, FREET4, T3FREE, THYROIDAB in the last 72 hours. Anemia Panel: No results for input(s): VITAMINB12, FOLATE, FERRITIN, TIBC, IRON, RETICCTPCT in the last 72 hours. Sepsis Labs: No results for input(s): PROCALCITON, LATICACIDVEN in the last 168 hours.  Recent Results (from the  past 240 hour(s))  Respiratory Panel by RT PCR (Flu A&B, Covid) - Nasopharyngeal Swab     Status: None   Collection Time: 02/07/20  4:47 AM   Specimen: Nasopharyngeal Swab  Result Value Ref Range Status   SARS Coronavirus 2 by RT PCR NEGATIVE NEGATIVE Final    Comment: (NOTE) SARS-CoV-2 target nucleic acids are NOT DETECTED. The SARS-CoV-2 RNA is generally detectable in upper respiratoy specimens during the acute phase of infection. The lowest concentration of SARS-CoV-2 viral  copies this assay can detect is 131 copies/mL. A negative result does not preclude SARS-Cov-2 infection and should not be used as the sole basis for treatment or other patient management decisions. A negative result may occur with  improper specimen collection/handling, submission of specimen other than nasopharyngeal swab, presence of viral mutation(s) within the areas targeted by this assay, and inadequate number of viral copies (<131 copies/mL). A negative result must be combined with clinical observations, patient history, and epidemiological information. The expected result is Negative. Fact Sheet for Patients:  PinkCheek.be Fact Sheet for Healthcare Providers:  GravelBags.it This test is not yet ap proved or cleared by the [place] FDA and  has been authorized for detection and/or diagnosis of SARS-CoV-2 by FDA under an Emergency Use Authorization (EUA). This EUA will remain  in effect (meaning this test can be used) for the duration of the COVID-19 declaration under Section 564(b)(1) of the Act, 21 U.S.C. section 360bbb-3(b)(1), unless the authorization is terminated or revoked sooner.    Influenza A by PCR NEGATIVE NEGATIVE Final   Influenza B by PCR NEGATIVE NEGATIVE Final    Comment: (NOTE) The Xpert Xpress SARS-CoV-2/FLU/RSV assay is intended as an aid in  the diagnosis of influenza from Nasopharyngeal swab specimens and  should not be  used as a sole basis for treatment. Nasal washings and  aspirates are unacceptable for Xpert Xpress SARS-CoV-2/FLU/RSV  testing. Fact Sheet for Patients: PinkCheek.be Fact Sheet for Healthcare Providers: GravelBags.it This test is not yet approved or cleared by the [place] FDA and  has been authorized for detection and/or diagnosis of SARS-CoV-2 by  FDA under an Emergency Use Authorization (EUA). This EUA will remain  in effect (meaning this test can be used) for the duration of the  Covid-19 declaration under Section 564(b)(1) of the Act, 21  U.S.C. section 360bbb-3(b)(1), unless the authorization is  terminated or revoked. Performed at [facility], [place] [address], [place], [place] [address]   Surgical pcr screen     Status: None   Collection Time: 02/07/20  2:40 PM   Specimen: Nasal Mucosa; Nasal Swab  Result Value Ref Range Status   MRSA, PCR NEGATIVE NEGATIVE Final   Staphylococcus aureus NEGATIVE NEGATIVE Final    Comment: (NOTE) The Xpert SA Assay (FDA approved for NASAL specimens in patients 86 years of age and older), is one component of a comprehensive surveillance program. It is not intended to diagnose infection nor to guide or monitor treatment. Performed at [facility], [place] [address], [place], [place] [address]          Radiology Studies: Pelvis Portable  Result Date: 02/07/2020 CLINICAL DATA:  Intramedullary nail placement for fracture EXAM: PORTABLE PELVIS 1-2 VIEWS COMPARISON:  Preoperative study February 07, 2020 FINDINGS: Frontal view obtained. There is screw and nail fixation through an intertrochanteric femur fracture. The tip of the screw is in the proximal femoral head. Alignment at the fracture site is near anatomic. No new fracture. No dislocation. Soft tissue air on the left is an expected postoperative finding. There is mild symmetric narrowing of each  hip joint. IMPRESSION: Postoperative fixation through intertrochanteric femur fracture. Tip of screw and proximal femoral head. No new fracture. No dislocation. Near anatomic alignment at fracture site on the left. Electronically Signed   By: Lowella Grip III M.D.   On: 02/07/2020 19:29   DG C-Arm 1-60 Min-No Report  Result Date: 02/07/2020 CLINICAL DATA:  Left femoral nail. EXAM: LEFT FEMUR  2 VIEWS; DG C-ARM 1-60 MIN-NO REPORT COMPARISON:  Preoperative hip radiograph earlier today. FINDINGS: Seven fluoroscopic spot views obtained in the operating room. Femoral nail with trans trochanteric screw traverses proximal femur fracture. The fracture is in improved alignment compared to preoperative imaging. Total dose 12.89 mGy. IMPRESSION: Procedural fluoroscopy during left femur fracture ORIF. Electronically Signed   By: Keith Rake M.D.   On: 02/07/2020 19:13   DG FEMUR MIN 2 VIEWS LEFT  Result Date: 02/07/2020 CLINICAL DATA:  Left femoral nail. EXAM: LEFT FEMUR 2 VIEWS; DG C-ARM 1-60 MIN-NO REPORT COMPARISON:  Preoperative hip radiograph earlier today. FINDINGS: Seven fluoroscopic spot views obtained in the operating room. Femoral nail with trans trochanteric screw traverses proximal femur fracture. The fracture is in improved alignment compared to preoperative imaging. Total dose 12.89 mGy. IMPRESSION: Procedural fluoroscopy during left femur fracture ORIF. Electronically Signed   By: Keith Rake M.D.   On: 02/07/2020 19:13        Scheduled Meds: . aspirin EC  325 mg Oral Q breakfast  . docusate sodium  100 mg Oral BID  . donepezil  10 mg Oral QHS  . DULoxetine  30 mg Oral Daily  . fluticasone  1 spray Each Nare Daily  . levothyroxine  125 mcg Oral QAC breakfast  . loratadine  10 mg Oral Daily  . losartan  100 mg Oral Daily  . metoprolol tartrate  12.5 mg Oral Daily  . polyvinyl alcohol  1 drop Both Eyes BID   Continuous Infusions:    Time spent: 20 minutes with over 50%  of the time coordinating the patient's care    Harold Hedge, DO [facility] Pager [phone]  Call night coverage person covering after 7pm

## 2020-02-09 NOTE — Progress Notes (Signed)
Patient ID: Julia Carey, female   DOB: December 02, 1927, 86 y.o.   MRN: [id number] The patient is sitting up and eating at the bedside and appears comfortable.  Her nephew at the bedside also attest to the fact that she looks good.  Her left hip dressing is clean and dry and intact.  The plan is to get her back to her skilled nursing facility tomorrow.  I will see her in 2 weeks as an outpatient and regular follow-up.

## 2020-02-09 NOTE — Plan of Care (Signed)

## 2020-02-10 LAB — CBC
HCT: 23.4 % — ABNORMAL LOW (ref 36.0–46.0)
Hemoglobin: 7.6 g/dL — ABNORMAL LOW (ref 12.0–15.0)
MCH: 33 pg (ref 26.0–34.0)
MCHC: 32.5 g/dL (ref 30.0–36.0)
MCV: 101.7 fL — ABNORMAL HIGH (ref 80.0–100.0)
Platelets: 203 10*3/uL (ref 150–400)
RBC: 2.3 MIL/uL — ABNORMAL LOW (ref 3.87–5.11)
RDW: 12.8 % (ref 11.5–15.5)
WBC: 8.2 10*3/uL (ref 4.0–10.5)
nRBC: 0 % (ref 0.0–0.2)

## 2020-02-10 MED ORDER — SODIUM CHLORIDE 0.9 % IV BOLUS: 500.0000 mL | Freq: Once | INTRAVENOUS | Status: DC

## 2020-02-10 MED ORDER — ASPIRIN 325 MG PO TBEC: 325.0000 mg | DELAYED_RELEASE_TABLET | Freq: Every day | ORAL | 0 refills | Status: AC

## 2020-02-10 NOTE — TOC Transition Note (Addendum)
Transition of Care [facility]) - CM/SW Discharge Note   Patient Details  Name: Julia Carey MRN: [id number] Date of Birth: 09-21-1928  [facility] CM/SW Contact:  Lia Hopping, [place] Phone Number: 02/10/2020, 9:13 AM   Clinical Narrative:    Patient will discharge back to [facility], Memory care with Therapy and Hospice services.  [facility] will transport.   Nurse call report to: [phone], ask for Devette.     Final next level of care: Memory Care Barriers to Discharge: Continued Medical Work up   Patient Goals and CMS Choice Patient states their goals for this hospitalization and ongoing recovery are:: legal Guardian, " I need guidance on what will be best for her at this point." CMS Medicare.gov Compare Post Acute Care list provided to:: Patient Choice offered to / list presented to : Patient  Discharge Placement                Patient to be transferred to facility by: [place] Name of family member notified: [name] Patient and family notified of of transfer: 02/10/20  Discharge Plan and Services In-house Referral: Hospice / Palliative Care Discharge Planning Services: CM Consult Post Acute Care Choice: Nursing Home          DME Arranged: N/A DME Agency: NA                  Social Determinants of Health (SDOH) Interventions     Readmission Risk Interventions No flowsheet data found.

## 2020-02-10 NOTE — NC FL2 (Addendum)
[place] LEVEL OF CARE SCREENING TOOL     IDENTIFICATION  Patient Name: Julia Carey Birthdate: 12-17-1927 Sex: female Admission Date (Current Location): 02/07/2020  [facility] and [place] Number:  Herbalist and Address:  [facility],  [place], [place]      Provider Number: [id number]  Attending Physician Name and Address:  Harold Hedge, MD  Relative Name and Phone Number:  Eldridge Scot   [phone]    Current Level of Care: Hospital Recommended Level of Care: Memory Care Prior Approval Number:    Date Approved/Denied:   PASRR Number:    Discharge Plan: Other (Comment)(Memory Care)    Current Diagnoses: Patient Active Problem List   Diagnosis Date Noted  . Closed displaced fracture of left femoral neck ([place]) 02/07/2020  . Hip fracture ([place]) 02/07/2020  . HTN (hypertension) 02/07/2020  . Leukocytosis 02/07/2020  . Closed intertrochanteric fracture of left hip, initial encounter ([place])   . Acute metabolic encephalopathy 0000000  . Acute lower UTI 02/19/2019  . Hypothyroidism 09/25/2018  . Dementia ([place]) 09/25/2018  . Fall at home, initial encounter 09/25/2018  . Subarachnoid hemorrhage following injury with brief loss of consciousness but without open intracranial wound ([place]) 09/25/2018  . Accelerated hypertension 09/25/2018  . Muscle weakness (generalized) 09/25/2018  . Subarachnoid hemorrhage ([place]) 09/25/2018    Orientation RESPIRATION BLADDER Height & Weight     Self    Incontinent Weight: 130 lb 1.1 oz (59 kg) Height:  5\' 2"  (157.5 cm)  BEHAVIORAL SYMPTOMS/MOOD NEUROLOGICAL BOWEL NUTRITION STATUS      Incontinent Diet(Regular)  AMBULATORY STATUS COMMUNICATION OF NEEDS Skin   Extensive Assist Verbally Skin abrasions, Surgical wounds(Left Hip)                       Personal Care Assistance Level of Assistance  Bathing, Feeding, Dressing Bathing Assistance: Maximum  assistance Feeding assistance: Limited assistance Dressing Assistance: Maximum assistance     Functional Limitations Info  Sight, Hearing, Speech Sight Info: Adequate Hearing Info: Adequate Speech Info: Adequate    SPECIAL CARE FACTORS FREQUENCY  PT (By licensed PT), OT (By licensed OT)     PT Frequency: 3X/week OT Frequency: 3X/week            Contractures Contractures Info: Not present    Additional Factors Info  Code Status, Allergies, Psychotropic Code Status Info: DNR Allergies Info: Allergies: Amoxicillin, Clindamycin/lincomycin, Keflex Cephalexin Psychotropic Info: Cymbalta, Aricept           Discharge Medications: Please see discharge summary for a list of discharge medications.        Allergies as of 02/10/2020      Reactions   Amoxicillin Hives   "Allergic," per MAR: Did it involve swelling of the face/tongue/throat, SOB, or low BP? Unk Did it involve sudden or severe rash/hives, skin peeling, or any reaction on the inside of your mouth or nose? Unk Did you need to seek medical attention at a hospital or doctor's office? Unk When did it last happen? Unk If all above answers are "NO", may proceed with cephalosporin use.   Clindamycin/lincomycin Hives   "Allergic, " per MAR   Keflex [cephalexin] Hives   "Allergic, " per [facility]         Medication List    STOP taking these medications   aspirin 81 MG chewable tablet Replaced by: aspirin 325 MG EC tablet   azithromycin 250 MG tablet Commonly  known as: ZITHROMAX   dexamethasone 6 MG tablet Commonly known as: DECADRON     TAKE these medications   acetaminophen 325 MG tablet Commonly known as: TYLENOL Take 325 mg by mouth 3 (three) times daily.   aspirin 325 MG EC tablet Take 1 tablet (325 mg total) by mouth daily with breakfast. Replaces: aspirin 81 MG chewable tablet   cetirizine 5 MG tablet Commonly known as: ZYRTEC Take 5 mg by mouth daily.   donepezil 10 MG  tablet Commonly known as: ARICEPT Take 10 mg by mouth at bedtime.   DULoxetine 30 MG capsule Commonly known as: CYMBALTA Take 30 mg by mouth daily.   fluticasone 50 MCG/ACT nasal spray Commonly known as: FLONASE Place 1 spray into both nostrils daily.   levothyroxine 125 MCG tablet Commonly known as: SYNTHROID Take 125 mcg by mouth daily before breakfast. What changed: Another medication with the same name was removed. Continue taking this medication, and follow the directions you see here.   loperamide 2 MG tablet Commonly known as: IMODIUM A-D Take 2 mg by mouth 4 (four) times daily as needed for diarrhea or loose stools.   losartan 100 MG tablet Commonly known as: Cozaar Take 1 tablet (100 mg total) by mouth daily for 30 days.   melatonin 3 MG Tabs tablet Take 3 mg by mouth at bedtime.   methocarbamol 500 MG tablet Commonly known as: ROBAXIN Take 500 mg by mouth 2 (two) times daily as needed for muscle spasms.   metoprolol tartrate 25 MG tablet Commonly known as: LOPRESSOR Take 12.5 mg by mouth every morning.   OCUSOFT EYELID CLEANSING EX Place 1 application into both eyes daily.   omeprazole 20 MG capsule Commonly known as: PRILOSEC Take 20 mg by mouth daily as needed (reflux).   polyethylene glycol 17 g packet Commonly known as: MIRALAX / GLYCOLAX Take 17 g by mouth daily. MIX INTO 8 OUNCES OF LIQUID   PROTEIN PO Take 1 Bottle by mouth 3 (three) times daily after meals.   Refresh Optive Advanced 0.5-1-0.5 % Soln Generic drug: Carboxymeth-Glycerin-Polysorb Place 1 drop into both eyes 2 (two) times daily.   vitamin B-12 1000 MCG tablet Commonly known as: CYANOCOBALAMIN Take 1,000 mcg by mouth every other day.        Relevant Imaging Results:  Relevant Lab Results:   Additional Information SS#: [id number]  Lia Hopping, LCSW

## 2020-02-10 NOTE — Discharge Summary (Addendum)
Physician Discharge Summary  Julia Carey [id number] DOB: [birth year]   PCP: System, Pcp Not In  Admit date: 02/07/2020 Discharge date: 02/10/2020 Length of Stay: 3 days   Code Status: DNR  Admitted From: ALF Discharged to:  ALF  Discharge Condition: improving  Recommendations for Outpatient Follow-up   Follow up with ortho in 2 weeks She is on Full dose aspirin for DVT prophylaxis post surgery.  Hb low but stable post surgery without signs of bleed and hemodynamically stable. Likely post op anemia. Monitor Hb while on Aspirin. Follow up [place]  An 86 year old female with history of advanced dementia follows with [facility], hypothyroidism, hypertension, osteoporosis who presented from SNF following unwitnessed fall and subsequent left hip pain found to have left intertrochanteric hip fracture on x-ray.  Orthopedics was consulted.  She subsequently underwent left intertrochanteric ORIF with Dr. Ninfa Linden on 02/07/2020.  She had since had acute on chronic anemia without obvious bleed or concern for bleed into her hip, suspected dilutional in combination with post op anemia. She remained hemodynamically stable on room air. She is being discharged back to her ALF with physical therapy services.   A & P   Principal Problem:   Closed displaced fracture of left femoral neck (HCC) Active Problems:   Hypothyroidism   Dementia (HCC)   Hip fracture (HCC)   HTN (hypertension)   Leukocytosis   Closed intertrochanteric fracture of left hip, initial encounter (HCC)     Left intertrochanteric hip fracture secondary to mechanical fall s/p ORIF with Dr. Ninfa Linden on 02/07/2020, POD 3 Comfortable today resolved reactive leukocytosis Per Ortho: follow up in two weeks Continue full dose aspirin for DVT prophylaxis but monitor Hb   Advanced dementia  with hospice care Continue donepezil   Hypertension Soft BP today which resolved on its own after holding  antihypertensives Continue home meds with hold parameters for SBP<100 or HR <60   Hypothyroidism on Synthroid   Acute on chronic macrocytic anemia, clinically undetermined       likely combination between dilutional and postop anemia Hb 11.0 on admission, currently 8.3-> 7.6 and stable at 7.6 No obvious source of bleed and without concern for bleeding into hip  FOBT negative Hemodynamically stable on RA    Consultants  ortho  Procedures   s/p ORIF with Dr. Ninfa Linden on 02/07/2020  Antibiotics   Anti-infectives (From admission, onward)    Start     Dose/Rate Route Frequency Ordered Stop   02/08/20 0400  vancomycin (VANCOCIN) IVPB 1000 mg/200 mL premix     1,000 mg 200 mL/hr over 60 Minutes Intravenous Every 12 hours 02/07/20 2019 02/08/20 0506   02/07/20 1430  vancomycin (VANCOCIN) IVPB 1000 mg/200 mL premix     1,000 mg 200 mL/hr over 60 Minutes Intravenous On call to O.R. 02/07/20 1421 02/07/20 1711        Subjective   Patient is nonverbal due to advanced dementia but is comfortable at bedside. No overnight events. Nursing noted hypotension which resolved on its own.  Objective   Discharge Exam: Vitals:   02/10/20 0924 02/10/20 1034  BP: 93/75 (!) 104/45  Pulse: (!) 107 87  Resp:    Temp: 97.9 F (36.6 C)   SpO2: 99% 97%   Vitals:   02/10/20 0606 02/10/20 0922 02/10/20 0924 02/10/20 1034  BP: (!) 161/61 93/75 93/75  (!) 104/45  Pulse: 88 (!) 105 (!) 107 87  Resp: 20 16    Temp: (!) 97.4 F (  36.3 C) 97.9 F (36.6 C) 97.9 F (36.6 C)   TempSrc: Oral Oral Oral   SpO2: 96%  99% 97%  Weight:      Height:        Physical Exam Vitals and nursing note reviewed.  Constitutional:      Comments: Frail elderly female, nonverbal at baseline  HENT:     Head: Normocephalic.  Eyes:     Conjunctiva/sclera: Conjunctivae normal.  Cardiovascular:     Rate and Rhythm: Normal rate and regular rhythm.  Pulmonary:     Effort: Pulmonary effort is normal. No  respiratory distress.  Abdominal:     General: Abdomen is flat. There is no distension.  Musculoskeletal:     Comments: Post surgical hip without significant ecchymosis and dressing is clean dry and intact  Skin:    Coloration: Skin is not jaundiced.  Neurological:     Mental Status: She is alert. Mental status is at baseline.       The results of significant diagnostics from this hospitalization (including imaging, microbiology, ancillary and laboratory) are listed below for reference.     Microbiology: Recent Results (from the past 240 hour(s))  Respiratory Panel by RT PCR (Flu A&B, Covid) - Nasopharyngeal Swab     Status: None   Collection Time: 02/07/20  4:47 AM   Specimen: Nasopharyngeal Swab  Result Value Ref Range Status   SARS Coronavirus 2 by RT PCR NEGATIVE NEGATIVE Final    Comment: (NOTE) SARS-CoV-2 target nucleic acids are NOT DETECTED. The SARS-CoV-2 RNA is generally detectable in upper respiratoy specimens during the acute phase of infection. The lowest concentration of SARS-CoV-2 viral copies this assay can detect is 131 copies/mL. A negative result does not preclude SARS-Cov-2 infection and should not be used as the sole basis for treatment or other patient management decisions. A negative result may occur with  improper specimen collection/handling, submission of specimen other than nasopharyngeal swab, presence of viral mutation(s) within the areas targeted by this assay, and inadequate number of viral copies (<131 copies/mL). A negative result must be combined with clinical observations, patient history, and epidemiological information. The expected result is Negative. Fact Sheet for Patients:  PinkCheek.be Fact Sheet for Healthcare Providers:  GravelBags.it This test is not yet ap proved or cleared by the [place] FDA and  has been authorized for detection and/or diagnosis of SARS-CoV-2  by FDA under an Emergency Use Authorization (EUA). This EUA will remain  in effect (meaning this test can be used) for the duration of the COVID-19 declaration under Section 564(b)(1) of the Act, 21 U.S.C. section 360bbb-3(b)(1), unless the authorization is terminated or revoked sooner.    Influenza A by PCR NEGATIVE NEGATIVE Final   Influenza B by PCR NEGATIVE NEGATIVE Final    Comment: (NOTE) The Xpert Xpress SARS-CoV-2/FLU/RSV assay is intended as an aid in  the diagnosis of influenza from Nasopharyngeal swab specimens and  should not be used as a sole basis for treatment. Nasal washings and  aspirates are unacceptable for Xpert Xpress SARS-CoV-2/FLU/RSV  testing. Fact Sheet for Patients: PinkCheek.be Fact Sheet for Healthcare Providers: GravelBags.it This test is not yet approved or cleared by the [place] FDA and  has been authorized for detection and/or diagnosis of SARS-CoV-2 by  FDA under an Emergency Use Authorization (EUA). This EUA will remain  in effect (meaning this test can be used) for the duration of the  Covid-19 declaration under Section 564(b)(1) of the Act, 21  U.S.C. section  360bbb-3(b)(1), unless the authorization is  terminated or revoked. Performed at [facility], [place] [address] [place], [place] [address]   Surgical pcr screen     Status: None   Collection Time: 02/07/20  2:40 PM   Specimen: Nasal Mucosa; Nasal Swab  Result Value Ref Range Status   MRSA, PCR NEGATIVE NEGATIVE Final   Staphylococcus aureus NEGATIVE NEGATIVE Final    Comment: (NOTE) The Xpert SA Assay (FDA approved for NASAL specimens in patients 67 years of age and older), is one component of a comprehensive surveillance program. It is not intended to diagnose infection nor to guide or monitor treatment. Performed at [facility], [place] [address], [place], [place] [address]   SARS  CORONAVIRUS 2 (TAT 6-24 HRS) Nasopharyngeal Nasopharyngeal Swab     Status: None   Collection Time: 02/09/20  1:04 PM   Specimen: Nasopharyngeal Swab  Result Value Ref Range Status   SARS Coronavirus 2 NEGATIVE NEGATIVE Final    Comment: (NOTE) SARS-CoV-2 target nucleic acids are NOT DETECTED. The SARS-CoV-2 RNA is generally detectable in upper and lower respiratory specimens during the acute phase of infection. Negative results do not preclude SARS-CoV-2 infection, do not rule out co-infections with other pathogens, and should not be used as the sole basis for treatment or other patient management decisions. Negative results must be combined with clinical observations, patient history, and epidemiological information. The expected result is Negative. Fact Sheet for Patients: SugarRoll.be Fact Sheet for Healthcare Providers: https://www.woods-mathews.com/ This test is not yet approved or cleared by the [place] FDA and  has been authorized for detection and/or diagnosis of SARS-CoV-2 by FDA under an Emergency Use Authorization (EUA). This EUA will remain  in effect (meaning this test can be used) for the duration of the COVID-19 declaration under Section 56 4(b)(1) of the Act, 21 U.S.C. section 360bbb-3(b)(1), unless the authorization is terminated or revoked sooner. Performed at [facility], [place] [address], [place], [place] [address]      Labs: BNP (last 3 results) Recent Labs    02/19/19 2033  BNP [id number]*   Basic Metabolic Panel: Recent Labs  Lab 02/07/20 0447 02/08/20 0254 02/09/20 0320  NA 138 140 137  K 3.7 4.0 3.6  CL 106 111 109  CO2 24 23 22   GLUCOSE 119* 122* 121*  BUN 29* 20 16  CREATININE 1.04* 0.89 1.08*  CALCIUM 10.2 9.5 9.7  MG  --  1.9  --    Liver Function Tests: No results for input(s): AST, ALT, ALKPHOS, BILITOT, PROT, ALBUMIN in the last 168 hours. No results for input(s): LIPASE, AMYLASE in  the last 168 hours. No results for input(s): AMMONIA in the last 168 hours. CBC: Recent Labs  Lab 02/07/20 0447 02/07/20 0447 02/08/20 0254 02/08/20 0842 02/08/20 2024 02/09/20 0320 02/09/20 1606 02/09/20 2126 02/10/20 0323  WBC 15.4*  --  7.7  --   --  8.4  --   --  8.2  NEUTROABS 13.0*  --   --   --   --   --   --   --   --   HGB 11.0*   < > 8.3*   < > 7.7* 7.6* 7.6* 7.8* 7.6*  HCT 34.2*   < > 26.1*   < > 23.5* 23.7* 23.8* 24.1* 23.4*  MCV 102.4*  --  104.8*  --   --  102.2*  --   --  101.7*  PLT 250  --  182  --   --  185  --   --  203   < > = values in this interval not displayed.   Cardiac Enzymes: No results for input(s): CKTOTAL, CKMB, CKMBINDEX, TROPONINI in the last 168 hours. BNP: Invalid input(s): POCBNP CBG: No results for input(s): GLUCAP in the last 168 hours. D-Dimer No results for input(s): DDIMER in the last 72 hours. Hgb A1c No results for input(s): HGBA1C in the last 72 hours. Lipid Profile No results for input(s): CHOL, HDL, LDLCALC, TRIG, CHOLHDL, LDLDIRECT in the last 72 hours. Thyroid function studies No results for input(s): TSH, T4TOTAL, T3FREE, THYROIDAB in the last 72 hours.  Invalid input(s): FREET3 Anemia work up No results for input(s): VITAMINB12, FOLATE, FERRITIN, TIBC, IRON, RETICCTPCT in the last 72 hours. Urinalysis    Component Value Date/Time   COLORURINE YELLOW 10/07/2019 1851   APPEARANCEUR HAZY (A) 10/07/2019 1851   LABSPEC 1.024 10/07/2019 1851   PHURINE 5.0 10/07/2019 1851   GLUCOSEU NEGATIVE 10/07/2019 1851   HGBUR NEGATIVE 10/07/2019 1851   BILIRUBINUR NEGATIVE 10/07/2019 1851   KETONESUR 5 (A) 10/07/2019 1851   PROTEINUR 100 (A) 10/07/2019 1851   NITRITE NEGATIVE 10/07/2019 1851   LEUKOCYTESUR NEGATIVE 10/07/2019 1851   Sepsis Labs Invalid input(s): PROCALCITONIN,  WBC,  LACTICIDVEN Microbiology Recent Results (from the past 240 hour(s))  Respiratory Panel by RT PCR (Flu A&B, Covid) - Nasopharyngeal Swab      Status: None   Collection Time: 02/07/20  4:47 AM   Specimen: Nasopharyngeal Swab  Result Value Ref Range Status   SARS Coronavirus 2 by RT PCR NEGATIVE NEGATIVE Final    Comment: (NOTE) SARS-CoV-2 target nucleic acids are NOT DETECTED. The SARS-CoV-2 RNA is generally detectable in upper respiratoy specimens during the acute phase of infection. The lowest concentration of SARS-CoV-2 viral copies this assay can detect is 131 copies/mL. A negative result does not preclude SARS-Cov-2 infection and should not be used as the sole basis for treatment or other patient management decisions. A negative result may occur with  improper specimen collection/handling, submission of specimen other than nasopharyngeal swab, presence of viral mutation(s) within the areas targeted by this assay, and inadequate number of viral copies (<131 copies/mL). A negative result must be combined with clinical observations, patient history, and epidemiological information. The expected result is Negative. Fact Sheet for Patients:  PinkCheek.be Fact Sheet for Healthcare Providers:  GravelBags.it This test is not yet ap proved or cleared by the [place] FDA and  has been authorized for detection and/or diagnosis of SARS-CoV-2 by FDA under an Emergency Use Authorization (EUA). This EUA will remain  in effect (meaning this test can be used) for the duration of the COVID-19 declaration under Section 564(b)(1) of the Act, 21 U.S.C. section 360bbb-3(b)(1), unless the authorization is terminated or revoked sooner.    Influenza A by PCR NEGATIVE NEGATIVE Final   Influenza B by PCR NEGATIVE NEGATIVE Final    Comment: (NOTE) The Xpert Xpress SARS-CoV-2/FLU/RSV assay is intended as an aid in  the diagnosis of influenza from Nasopharyngeal swab specimens and  should not be used as a sole basis for treatment. Nasal washings and  aspirates are unacceptable for  Xpert Xpress SARS-CoV-2/FLU/RSV  testing. Fact Sheet for Patients: PinkCheek.be Fact Sheet for Healthcare Providers: GravelBags.it This test is not yet approved or cleared by the [place] FDA and  has been authorized for detection and/or diagnosis of SARS-CoV-2 by  FDA under an Emergency Use Authorization (EUA). This  EUA will remain  in effect (meaning this test can be used) for the duration of the  Covid-19 declaration under Section 564(b)(1) of the Act, 21  U.S.C. section 360bbb-3(b)(1), unless the authorization is  terminated or revoked. Performed at [facility], [place] [address], [place], [place] [address]   Surgical pcr screen     Status: None   Collection Time: 02/07/20  2:40 PM   Specimen: Nasal Mucosa; Nasal Swab  Result Value Ref Range Status   MRSA, PCR NEGATIVE NEGATIVE Final   Staphylococcus aureus NEGATIVE NEGATIVE Final    Comment: (NOTE) The Xpert SA Assay (FDA approved for NASAL specimens in patients 82 years of age and older), is one component of a comprehensive surveillance program. It is not intended to diagnose infection nor to guide or monitor treatment. Performed at [facility], [place] [address], [place], [place] [address]   SARS CORONAVIRUS 2 (TAT 6-24 HRS) Nasopharyngeal Nasopharyngeal Swab     Status: None   Collection Time: 02/09/20  1:04 PM   Specimen: Nasopharyngeal Swab  Result Value Ref Range Status   SARS Coronavirus 2 NEGATIVE NEGATIVE Final    Comment: (NOTE) SARS-CoV-2 target nucleic acids are NOT DETECTED. The SARS-CoV-2 RNA is generally detectable in upper and lower respiratory specimens during the acute phase of infection. Negative results do not preclude SARS-CoV-2 infection, do not rule out co-infections with other pathogens, and should not be used as the sole basis for treatment or other patient management decisions. Negative results must  be combined with clinical observations, patient history, and epidemiological information. The expected result is Negative. Fact Sheet for Patients: SugarRoll.be Fact Sheet for Healthcare Providers: https://www.woods-mathews.com/ This test is not yet approved or cleared by the [place] FDA and  has been authorized for detection and/or diagnosis of SARS-CoV-2 by FDA under an Emergency Use Authorization (EUA). This EUA will remain  in effect (meaning this test can be used) for the duration of the COVID-19 declaration under Section 56 4(b)(1) of the Act, 21 U.S.C. section 360bbb-3(b)(1), unless the authorization is terminated or revoked sooner. Performed at [facility], [place] [address], [place], [place] [address]     Discharge Instructions     Discharge Instructions     Diet - low sodium heart healthy   Complete by: As directed    Discharge instructions   Complete by: As directed    - follow up with orthopedic surgery in 2 weeks - aspirin increased to 325 mg for DVT prophylaxis post surgery and should be readdressed by ortho at next visit - tylenol for pain - blood levels low after the procedure not requiring a transfusion. You need repeat lab work in 3-5 days to follow up on your blood levels - continue other home medications   Increase activity slowly   Complete by: As directed       Allergies as of 02/10/2020       Reactions   Amoxicillin Hives   "Allergic," per MAR: Did it involve swelling of the face/tongue/throat, SOB, or low BP? Unk Did it involve sudden or severe rash/hives, skin peeling, or any reaction on the inside of your mouth or nose? Unk Did you need to seek medical attention at a hospital or doctor's office? Unk When did it last happen? Unk If all above answers are "NO", may proceed with cephalosporin use.   Clindamycin/lincomycin Hives   "Allergic, " per MAR   Keflex [cephalexin] Hives   "Allergic, "  per [facility]        Medication List     STOP taking these medications    aspirin 81 MG chewable tablet Replaced by: aspirin 325 MG EC tablet   azithromycin 250 MG tablet Commonly known as: ZITHROMAX   dexamethasone 6 MG tablet Commonly known as: DECADRON       TAKE these medications    acetaminophen 325 MG tablet Commonly known as: TYLENOL Take 325 mg by mouth 3 (three) times daily.   aspirin 325 MG EC tablet Take 1 tablet (325 mg total) by mouth daily with breakfast. Replaces: aspirin 81 MG chewable tablet   cetirizine 5 MG tablet Commonly known as: ZYRTEC Take 5 mg by mouth daily.   donepezil 10 MG tablet Commonly known as: ARICEPT Take 10 mg by mouth at bedtime.   DULoxetine 30 MG capsule Commonly known as: CYMBALTA Take 30 mg by mouth daily.   fluticasone 50 MCG/ACT nasal spray Commonly known as: FLONASE Place 1 spray into both nostrils daily.   levothyroxine 125 MCG tablet Commonly known as: SYNTHROID Take 125 mcg by mouth daily before breakfast. What changed: Another medication with the same name was removed. Continue taking this medication, and follow the directions you see here.   loperamide 2 MG tablet Commonly known as: IMODIUM A-D Take 2 mg by mouth 4 (four) times daily as needed for diarrhea or loose stools.   losartan 100 MG tablet Commonly known as: Cozaar Take 1 tablet (100 mg total) by mouth daily for 30 days.   melatonin 3 MG Tabs tablet Take 3 mg by mouth at bedtime.   methocarbamol 500 MG tablet Commonly known as: ROBAXIN Take 500 mg by mouth 2 (two) times daily as needed for muscle spasms.   metoprolol tartrate 25 MG tablet Commonly known as: LOPRESSOR Take 12.5 mg by mouth every morning.   OCUSOFT EYELID CLEANSING EX Place 1 application into both eyes daily.   omeprazole 20 MG capsule Commonly known as: PRILOSEC Take 20 mg by mouth daily as needed (reflux).   polyethylene glycol 17 g packet Commonly known as: MIRALAX /  GLYCOLAX Take 17 g by mouth daily. MIX INTO 8 OUNCES OF LIQUID   PROTEIN PO Take 1 Bottle by mouth 3 (three) times daily after meals.   Refresh Optive Advanced 0.5-1-0.5 % Soln Generic drug: Carboxymeth-Glycerin-Polysorb Place 1 drop into both eyes 2 (two) times daily.   vitamin B-12 1000 MCG tablet Commonly known as: CYANOCOBALAMIN Take 1,000 mcg by mouth every other day.        Contact information for follow-up providers     Mcarthur Rossetti, MD. Schedule an appointment as soon as possible for a visit in 2 week(s).   Specialty: Orthopedic Surgery Contact information: [place] [address] [phone]              Contact information for after-discharge care     Destination     [facility] .   Service: Assisted Living Contact information: [address] [address] [phone]                    Allergies  Allergen Reactions   Amoxicillin Hives    "Allergic," per MAR: Did it involve swelling of the face/tongue/throat, SOB, or low BP? Unk Did it involve sudden or severe rash/hives, skin peeling, or any reaction on the inside of your mouth or nose? Unk Did you need to seek medical attention at  a hospital or doctor's office? Unk When did it last happen? Unk If all above answers are "NO", may proceed with cephalosporin use.    Clindamycin/Lincomycin Hives    "Allergic, " per MAR   Keflex [Cephalexin] Hives    "Allergic, " per [facility]    Dispo: The patient is from: ALF              Anticipated d/c is to: ALF              Anticipated d/c date is: today              Patient currently is medically stable to d/c.  Time coordinating discharge: Over 30 minutes   SIGNED:   Harold Hedge, D.O. [facility] Pager: [phone]  02/10/2020, 1:19 PM

## 2020-02-10 NOTE — Plan of Care (Signed)
  Problem: Education: °Goal: Verbalization of understanding the information provided (i.e., activity precautions, restrictions, etc) will improve °Outcome: Progressing °  °Problem: Activity: °Goal: Ability to ambulate and perform ADLs will improve °Outcome: Progressing °  °Problem: Clinical Measurements: °Goal: Postoperative complications will be avoided or minimized °Outcome: Progressing °  °Problem: Self-Concept: °Goal: Ability to maintain and perform role responsibilities to the fullest extent possible will improve °Outcome: Progressing °  °

## 2020-02-10 NOTE — Progress Notes (Signed)
Report given to [place].

## 2020-02-23 ENCOUNTER — Ambulatory Visit (INDEPENDENT_AMBULATORY_CARE_PROVIDER_SITE_OTHER): Payer: Medicare Other | Admitting: Orthopaedic Surgery

## 2020-02-23 ENCOUNTER — Ambulatory Visit: Payer: Self-pay

## 2020-02-23 ENCOUNTER — Other Ambulatory Visit: Payer: Self-pay

## 2020-02-23 ENCOUNTER — Encounter: Payer: Self-pay | Admitting: Orthopaedic Surgery

## 2020-02-23 DIAGNOSIS — S72142A Displaced intertrochanteric fracture of left femur, initial encounter for closed fracture: Secondary | ICD-10-CM | POA: Diagnosis not present

## 2020-02-23 NOTE — Progress Notes (Signed)
The patient is a 86 year old female who is 2 weeks out from fixation of a intertrochanteric femur fracture with significant displacement of the left hip.  She stays in a nursing facility.  She has been mobilizing in her wheelchair.  She does have significant dementia.  She is awake and alert.  I can easily put her left operative hip the range of motion with her not withdrawing to pain.  Her staples have been removed and Steri-Strips applied.  Her incisions look good.  Compression of the hip causes no pain.  An AP and lateral of the left femur shows intact hardware from fixation of her fracture of the left trochanteric area of the hip.  I did give notes for the skilled nursing facility to have her only help with assistance.  She can increase activities otherwise as tolerated.  I would like to see her back in 4 weeks for potentially a final visit.  At that visit I would just like an AP and lateral of the left hip only.  We do not need to see the knee or the pelvis.  This can be done supine.

## 2020-03-22 ENCOUNTER — Ambulatory Visit (INDEPENDENT_AMBULATORY_CARE_PROVIDER_SITE_OTHER): Payer: Medicare Other | Admitting: Orthopaedic Surgery

## 2020-03-22 ENCOUNTER — Ambulatory Visit: Payer: Self-pay

## 2020-03-22 ENCOUNTER — Other Ambulatory Visit: Payer: Self-pay

## 2020-03-22 ENCOUNTER — Encounter: Payer: Self-pay | Admitting: Orthopaedic Surgery

## 2020-03-22 DIAGNOSIS — S72142A Displaced intertrochanteric fracture of left femur, initial encounter for closed fracture: Secondary | ICD-10-CM | POA: Diagnosis not present

## 2020-03-22 NOTE — Progress Notes (Signed)
The patient is an elderly 86 year old female who is now 6-week status post open reduction/internal fixation of a left proximal femur fracture.  Her nephew is with her.  He states that she is back at her baseline in terms of how she mobilizes in a wheelchair and crosses her legs.  She is mainly someone who uses a wheelchair to ambulate.  She denies any type of pain and he reports that she is doing well.  I can easily put her left operative hip through internal ex rotation with her not withdrawing to reaction to pain at all.  2 views of the left femur obtained and show that the fracture is anatomically aligned and shows interval healing and no evidence of hardware failure.  At this point follow-up can be as needed since she is back to her baseline.  I talked to her nephew in length about things.  All questions and concerns were answered and addressed.  Follow-up can be as needed unless there are any issues.

## 2020-03-27 ENCOUNTER — Emergency Department (HOSPITAL_COMMUNITY): Payer: Medicare Other

## 2020-03-27 ENCOUNTER — Other Ambulatory Visit: Payer: Self-pay

## 2020-03-27 ENCOUNTER — Encounter (HOSPITAL_COMMUNITY): Payer: Self-pay | Admitting: Emergency Medicine

## 2020-03-27 ENCOUNTER — Emergency Department (HOSPITAL_COMMUNITY)
Admission: EM | Admit: 2020-03-27 | Discharge: 2020-03-27 | Disposition: A | Discharge status: Skilled Nursing Facility | Payer: Medicare Other | Attending: Emergency Medicine | Admitting: Emergency Medicine

## 2020-03-27 DIAGNOSIS — S42022A Displaced fracture of shaft of left clavicle, initial encounter for closed fracture: Secondary | ICD-10-CM | POA: Diagnosis not present

## 2020-03-27 DIAGNOSIS — W19XXXA Unspecified fall, initial encounter: Secondary | ICD-10-CM | POA: Insufficient documentation

## 2020-03-27 DIAGNOSIS — S0083XA Contusion of other part of head, initial encounter: Secondary | ICD-10-CM | POA: Diagnosis not present

## 2020-03-27 DIAGNOSIS — S42002A Fracture of unspecified part of left clavicle, initial encounter for closed fracture: Secondary | ICD-10-CM

## 2020-03-27 DIAGNOSIS — Y92129 Unspecified place in nursing home as the place of occurrence of the external cause: Secondary | ICD-10-CM | POA: Diagnosis not present

## 2020-03-27 DIAGNOSIS — E039 Hypothyroidism, unspecified: Secondary | ICD-10-CM | POA: Insufficient documentation

## 2020-03-27 DIAGNOSIS — I10 Essential (primary) hypertension: Secondary | ICD-10-CM | POA: Diagnosis not present

## 2020-03-27 DIAGNOSIS — S4992XA Unspecified injury of left shoulder and upper arm, initial encounter: Secondary | ICD-10-CM | POA: Diagnosis present

## 2020-03-27 DIAGNOSIS — Z79899 Other long term (current) drug therapy: Secondary | ICD-10-CM | POA: Diagnosis not present

## 2020-03-27 DIAGNOSIS — Y998 Other external cause status: Secondary | ICD-10-CM | POA: Diagnosis not present

## 2020-03-27 DIAGNOSIS — Y9389 Activity, other specified: Secondary | ICD-10-CM | POA: Insufficient documentation

## 2020-03-27 MED ORDER — ACETAMINOPHEN 500 MG PO TABS
1000.0000 mg | ORAL_TABLET | Freq: Once | ORAL | Status: AC
  Administered 2020-03-27: 1000 mg via ORAL
  Filled 2020-03-27: qty 2

## 2020-03-27 MED ORDER — ACETAMINOPHEN 500 MG PO TABS
1000.0000 mg | ORAL_TABLET | Freq: Four times a day (QID) | ORAL | 0 refills | Status: AC | PRN
Start: 2020-03-27 — End: ?

## 2020-03-27 NOTE — Discharge Instructions (Signed)
KEEP IN SLING AS ABLE EXCEPT WITH BATHING AND DRESSING.  GIVE TYLENOL EVERY 6 TO 8 HOURS FOR PAIN.  FOLLOW UP WITH [facility] IN 1 WEEK.

## 2020-03-27 NOTE — ED Notes (Signed)
[facility] called for transport back to [facility]. 

## 2020-03-27 NOTE — ED Provider Notes (Signed)
[facility] DEPT Provider Note   CSN: [id number] Arrival date & time: 03/27/20  1934     History No chief complaint on file.   Julia Carey is a 86 y.o. female.  86 year old female with extensive past medical history below including dementia who presents with left clavicle injury.  Patient was at her skilled nursing facility today and this afternoon they noticed a hematoma on her left forehead as well as swelling and bruising of her left clavicle.  It is unclear whether she had a fall yesterday or today.  No anticoagulant use.  She recently recovered from fall with hip fracture.  The history is provided by the nursing home and a relative.       Past Medical History:  Diagnosis Date  . Age-related osteoporosis without current pathological fracture   . Dementia ([place])   . Dementia ([place])   . Dysphagia   . Fracture of first cervical vertebra (HCC)   . Hypothyroidism   . Insomnia   . Major depression, single episode   . Muscle weakness (generalized)     Patient Active Problem List   Diagnosis Date Noted  . Closed displaced fracture of left femoral neck ([place]) 02/07/2020  . Hip fracture ([place]) 02/07/2020  . HTN (hypertension) 02/07/2020  . Leukocytosis 02/07/2020  . Closed intertrochanteric fracture of left hip, initial encounter ([place])   . Acute metabolic encephalopathy 96/28/3662  . Acute lower UTI 02/19/2019  . Hypothyroidism 09/25/2018  . Dementia ([place]) 09/25/2018  . Fall at home, initial encounter 09/25/2018  . Subarachnoid hemorrhage following injury with brief loss of consciousness but without open intracranial wound ([place]) 09/25/2018  . Accelerated hypertension 09/25/2018  . Muscle weakness (generalized) 09/25/2018  . Subarachnoid hemorrhage ([place]) 09/25/2018    Past Surgical History:  Procedure Laterality Date  . INTRAMEDULLARY (IM) NAIL INTERTROCHANTERIC Left 02/07/2020   Procedure: INTRAMEDULLARY (IM) NAIL INTERTROCHANTRIC;  Surgeon:  Mcarthur Rossetti, MD;  Location: [facility];  Service: Orthopedics;  Laterality: Left;     OB History   No obstetric history on file.     No family history on file.  Social History   Tobacco Use  . Smoking status: Never Smoker  . Smokeless tobacco: Never Used  . Tobacco comment: Dementia,   Vaping Use  . Vaping Use: Never used  Substance Use Topics  . Alcohol use: Not Currently    Comment: UTA  . Drug use: Not Currently    Comment: UTA    Home Medications Prior to Admission medications   Medication Sig Start Date End Date Taking? Authorizing Provider  aspirin EC 325 MG EC tablet Take 1 tablet (325 mg total) by mouth daily with breakfast. 02/10/20  Yes Harold Hedge, MD  Carboxymeth-Glycerin-Polysorb (REFRESH OPTIVE ADVANCED) 0.5-1-0.5 % SOLN Place 1 drop into both eyes 2 (two) times daily.    Yes [provider]  cetirizine (ZYRTEC) 5 MG tablet Take 5 mg by mouth daily.   Yes [provider]  donepezil (ARICEPT) 10 MG tablet Take 10 mg by mouth at bedtime.    Yes [provider]  DULoxetine (CYMBALTA) 30 MG capsule Take 30 mg by mouth daily.   Yes [provider]  Eyelid Cleansers (OCUSOFT EYELID CLEANSING EX) Place 1 application into both eyes daily.    Yes [provider]  ferrous sulfate 300 (60 Fe) MG/5ML syrup Take 300 mg by mouth 2 (two) times daily with a meal.   Yes [provider]  fluticasone (  FLONASE) 50 MCG/ACT nasal spray Place 1 spray into both nostrils daily.   Yes [provider]  HYDROcodone-acetaminophen (NORCO/VICODIN) 5-325 MG tablet Take 1 tablet by mouth every 6 (six) hours as needed for moderate pain or severe pain.  02/11/20  Yes [provider]  levothyroxine (SYNTHROID) 137 MCG tablet Take 137 mcg by mouth daily. 03/23/20  Yes [provider]  loperamide (IMODIUM A-D) 2 MG tablet Take 2 mg by mouth 4 (four) times daily as needed for diarrhea or loose stools.   Yes [provider]  losartan (COZAAR) 100 MG tablet Take 100 mg by mouth daily.   Yes [provider]  Melatonin 3 MG TABS Take 3 mg by mouth at bedtime.   Yes [provider]  methocarbamol (ROBAXIN) 500 MG tablet Take 500 mg by mouth 2 (two) times daily as needed for muscle spasms.   Yes [provider]  metoprolol tartrate (LOPRESSOR) 25 MG tablet Take 12.5 mg by mouth every morning.    Yes [provider]  omeprazole (PRILOSEC) 20 MG capsule Take 20 mg by mouth daily as needed (reflux).   Yes [provider]  polyethylene glycol (MIRALAX / GLYCOLAX) packet Take 17 g by mouth daily. MIX INTO 8 OUNCES OF LIQUID   Yes [provider]  PROTEIN PO Take 1 Bottle by mouth 3 (three) times daily after meals.   Yes [provider]  vitamin B-12 (CYANOCOBALAMIN) 1000 MCG tablet Take 1,000 mcg by mouth every other day.    Yes [provider]  acetaminophen (TYLENOL) 500 MG tablet Take 2 tablets (1,000 mg total) by mouth every 6 (six) hours as needed for moderate pain. 03/27/20   Adianna Darwin, Wenda Overland, MD  losartan (COZAAR) 100 MG tablet Take 1 tablet (100 mg total) by mouth daily for 30 days. 02/21/19 02/07/20  Aline August, MD    Allergies    Amoxicillin, Clindamycin/lincomycin, and Keflex [cephalexin]  Review of Systems   Review of Systems  Unable to perform ROS: Dementia    Physical Exam Updated Vital Signs BP (!) 180/69   Pulse 64   Temp 98.5 F (36.9 C) (Oral)   Resp 16   SpO2 96%   Physical Exam Vitals and nursing note reviewed.  Constitutional:      General: She is not in acute distress.    Appearance: She is well-developed.     Comments: Frail, elderly woman alert and comfortable  HENT:     Head: Normocephalic.     Comments: Hematoma left forehead    Nose: Nose normal.  Eyes:     Conjunctiva/sclera: Conjunctivae normal.     Pupils: Pupils are equal, round, and reactive to light.  Cardiovascular:     Rate  and Rhythm: Normal rate and regular rhythm.     Heart sounds: Normal heart sounds.  Pulmonary:     Effort: Pulmonary effort is normal.     Breath sounds: Normal breath sounds.  Chest:     Chest wall: No tenderness.  Abdominal:     General: Bowel sounds are normal. There is no distension.     Palpations: Abdomen is soft.     Tenderness: There is no abdominal tenderness.  Musculoskeletal:        General: Swelling and tenderness present.     Cervical back: Normal range of motion and neck supple. No tenderness.     Comments: Swelling, tenderness and bruising of mid to distal clavicle extending onto anterior shoulder; normal ROM  RUE, BLE; pelvis stable  Skin:    General: Skin is warm and dry.  Neurological:     Mental Status: She is alert.     Comments: disoriented     ED Results / Procedures / Treatments   Labs (all labs ordered are listed, but only abnormal results are displayed) Labs Reviewed - No data to display  EKG None  Radiology DG Chest 2 View  Result Date: 03/27/2020 CLINICAL DATA:  Fall EXAM: CHEST - 2 VIEW COMPARISON:  02/07/2020 FINDINGS: Comminuted fracture mid to distal shaft of left clavicle with displacement and distraction. Possible subacute appearance given questionable mild soft tissue callus cranially. No focal airspace disease or effusion. Stable cardiomediastinal silhouette with aortic atherosclerosis. No pneumothorax. IMPRESSION: No active cardiopulmonary disease. Comminuted fracture involving the mid to distal left clavicle Electronically Signed   By: Donavan Foil M.D.   On: 03/27/2020 20:47   DG Clavicle Left  Result Date: 03/27/2020 CLINICAL DATA:  Unwitnessed fall EXAM: LEFT CLAVICLE - 2+ VIEWS COMPARISON:  02/07/2020 FINDINGS: Comminuted fracture involving mid to distal shaft of the clavicle with 1 shaft diameter inferior displacement of distal fracture fragment. No significant angulation. Borderline widening of the [facility] joint without displacement.  Abundant left supraclavicular soft tissue swelling. IMPRESSION: Comminuted, displaced and slightly distracted fracture involving the mid to distal shaft of the clavicle. This is potentially sub acute as there may be some soft tissue callus cranial to the fracture site. Electronically Signed   By: Donavan Foil M.D.   On: 03/27/2020 20:45   CT Head Wo Contrast  Result Date: 03/27/2020 CLINICAL DATA:  Suspected fall with forehead hematoma EXAM: CT HEAD WITHOUT CONTRAST TECHNIQUE: Contiguous axial images were obtained from the base of the skull through the vertex without intravenous contrast. COMPARISON:  CT brain 11/02/2019 FINDINGS: Brain: No acute territorial infarction, hemorrhage, or intracranial mass. Advanced atrophy. Extensive hypodensity in the white matter consistent with chronic small vessel ischemic change. Chronic lacunar infarcts within the right white matter, thalamus and basal ganglia. Subacute to chronic appearing right cerebellar infarct but new since head CT from January 2021. Stable ventricle size. Vascular: No hyperdense vessels.  Carotid vascular calcification Skull: No acute skull fracture Sinuses/Orbits: No acute finding. Other: Small left forehead hematoma IMPRESSION: 1. No definite CT evidence for acute intracranial abnormality. 2. Atrophy and chronic small vessel ischemic changes of the white matter. Subacute to chronic appearing right cerebellar infarct but new since January 2021 comparison head CT Electronically Signed   By: Donavan Foil M.D.   On: 03/27/2020 20:29   CT Cervical Spine Wo Contrast  Result Date: 03/27/2020 CLINICAL DATA:  Unwitnessed fall EXAM: CT CERVICAL SPINE WITHOUT CONTRAST TECHNIQUE: Multidetector CT imaging of the cervical spine was performed without intravenous contrast. Multiplanar CT image reconstructions were also generated. COMPARISON:  CT 11/02/2019, 10/01/2019, 07/30/2018 FINDINGS: Alignment: Dextroscoliosis in the AP projection. Kyphosis of the mid  cervical spine on sagittal series. 3 mm anterolisthesis C4 on C5 with trace retrolisthesis C5 on C6 and trace anterolisthesis C6 on C7. Stable abnormal alignment at the craniovertebral junction and C1-C2 lateral masses. Skull base and vertebrae: Chronic fracture deformity of C1. No acute interval fracture identified. Soft tissues and spinal canal: No prevertebral fluid or swelling. No visible canal hematoma. Disc levels: Degenerative changes at multiple levels with advanced degenerative change C4-C5, C5-C6 and C6-C7. Upper chest: Right apical scarring. Carotid vascular calcification. Incompletely visualized left clavicle fracture. Abundant soft tissue swelling at the left supraclavicular fossa Other: None IMPRESSION:  1. No definite acute fracture identified 2. Stable chronic deformity of C1 and stable chronic malalignment at the craniovertebral junction and C1-C2 articulation. 3. Incompletely visualized left clavicle fracture with abundant soft tissue swelling at the left supraclavicular fossa Electronically Signed   By: Donavan Foil M.D.   On: 03/27/2020 20:43    Procedures Procedures (including critical care time)  Medications Ordered in ED Medications  acetaminophen (TYLENOL) tablet 1,000 mg (has no administration in time range)    ED Course  I have reviewed the triage vital signs and the nursing notes.  Pertinent imaging results that were available during my care of the patient were reviewed by me and considered in my medical decision making (see chart for details).    MDM Rules/Calculators/A&P                           Obtain CT of head and C-spine given unwitnessed fall, negative acute.  X-rays show comminuted and displaced fracture of midshaft of left clavicle.  She has no skin tenting on exam to warrant immediate surgical intervention.  I have discussed with family member initial treatment with sling, ice, pain control, and orthopedics follow-up.  Will have patient follow-up with her  orthopedic clinic.  Reviewed return precautions regarding head injury and family member voiced understanding. Final Clinical Impression(s) / ED Diagnoses Final diagnoses:  Fracture of unspecified part of left clavicle, initial encounter for closed fracture  Traumatic hematoma of forehead, initial encounter    Rx / DC Orders ED Discharge Orders         Ordered    acetaminophen (TYLENOL) 500 MG tablet  Every 6 hours PRN     Discontinue  Reprint     03/27/20 2126           Malissia Rabbani, Wenda Overland, MD 03/27/20 2324

## 2020-03-27 NOTE — ED Triage Notes (Addendum)
Coming EMS from [facility] for suspected fall yesterday or this morning. Hematoma on forehead. Left shoulder dislocation at ac joint and comminuted fx of left clavicle per facility XR earlier. Dementia.

## 2020-03-27 NOTE — ED Notes (Signed)
Spoke with legal guardian regarding pts arrival to ED.

## 2020-03-27 NOTE — ED Notes (Signed)
Ortho called to apply shoulder immobilizer/sling.

## 2020-03-27 NOTE — ED Notes (Signed)
[facility] states they are unable to transport pt due to an elevated BP and bc the pt is hospice. [facility] called to transport pt back to [facility].

## 2020-04-04 ENCOUNTER — Ambulatory Visit (INDEPENDENT_AMBULATORY_CARE_PROVIDER_SITE_OTHER): Payer: Medicare Other | Admitting: Orthopedic Surgery

## 2020-04-04 DIAGNOSIS — S42022D Displaced fracture of shaft of left clavicle, subsequent encounter for fracture with routine healing: Secondary | ICD-10-CM

## 2020-04-06 ENCOUNTER — Encounter: Payer: Self-pay | Admitting: Orthopedic Surgery

## 2020-04-06 NOTE — Progress Notes (Signed)
Office Visit Note   Patient: Julia Carey           Date of Birth: 1928-05-15           MRN: [id number] Visit Date: 04/04/2020 Requested by: No referring provider defined for this encounter. PCP: System, Pcp Not In  Subjective: Chief Complaint  Patient presents with  . Clavicle Injury    HPI: Julia Carey is a 86 y.o. female who presents to the office complaining of left shoulder pain.  Patient had an injury on 03/24/2020 where she fell.  She was seen in the [facility] where x-rays revealed a left shoulder clavicle fracture with displacement.  She has had no previous issues with her shoulder according to her family member who is accompanying her.  She has dementia and is nonverbal.  She seems to be tolerating pain well according to her family member.  She has recently been treated for a hip fracture with hip pinning by Dr. Ninfa Linden and released from his care.                ROS:  All systems reviewed are negative as they relate to the chief complaint within the history of present illness.  Patient denies fevers or chills.  Assessment & Plan: Visit Diagnoses:  1. Traumatic closed displaced fracture of shaft of clavicle with routine healing, left     Plan:  Patient is an 86 year old female who presents for evaluation of left clavicle fracture.  Radiographs were reviewed and she does have a displaced fracture.  However she is nonverbal and has dementia at baseline.  She is having no significant pain according to her family member and today's physical exam, though this is difficult to determine given her medical situation.  She was tolerating range of motion well and appeared interactive even despite removing her shoulder around.  Plan for patient to follow-up in 4 weeks for clinical recheck with x-rays.  She is doing well at that time we will release her.  Follow-Up Instructions: No follow-ups on file.   Orders:  No orders of the defined types were placed in this encounter.  No  orders of the defined types were placed in this encounter.     Procedures: No procedures performed   Clinical Data: No additional findings.  Objective: Vital Signs: There were no vitals taken for this visit.  Physical Exam:  Constitutional: Patient appears well-developed HEENT:  Head: Normocephalic Eyes:EOM are normal Neck: Normal range of motion Cardiovascular: Normal rate Pulmonary/chest: Effort normal Neurologic: Patient is alert Skin: Skin is warm Psychiatric: Patient has normal mood and affect  Ortho Exam:  Left shoulder exam No significant pain is obvious with passive range of motion of the left shoulder.  Difficult to tell due to patient's nonverbal dementia.  Diffuse bruising around the fracture site.  No tenting of the skin.  No evidence of open fracture.  No significant tenderness to palpation over the clavicle.   Specialty Comments:  No specialty comments available.  Imaging: No results found.   PMFS History: Patient Active Problem List   Diagnosis Date Noted  . Closed displaced fracture of left femoral neck ([place]) 02/07/2020  . Hip fracture ([place]) 02/07/2020  . HTN (hypertension) 02/07/2020  . Leukocytosis 02/07/2020  . Closed intertrochanteric fracture of left hip, initial encounter ([place])   . Acute metabolic encephalopathy 55/73/2202  . Acute lower UTI 02/19/2019  . Hypothyroidism 09/25/2018  . Dementia ([place]) 09/25/2018  . Fall at home, initial encounter  09/25/2018  . Subarachnoid hemorrhage following injury with brief loss of consciousness but without open intracranial wound ([place]) 09/25/2018  . Accelerated hypertension 09/25/2018  . Muscle weakness (generalized) 09/25/2018  . Subarachnoid hemorrhage ([place]) 09/25/2018   Past Medical History:  Diagnosis Date  . Age-related osteoporosis without current pathological fracture   . Dementia ([place])   . Dementia ([place])   . Dysphagia   . Fracture of first cervical vertebra (HCC)   . Hypothyroidism   .  Insomnia   . Major depression, single episode   . Muscle weakness (generalized)     No family history on file.  Past Surgical History:  Procedure Laterality Date  . INTRAMEDULLARY (IM) NAIL INTERTROCHANTERIC Left 02/07/2020   Procedure: INTRAMEDULLARY (IM) NAIL INTERTROCHANTRIC;  Surgeon: Mcarthur Rossetti, MD;  Location: [facility];  Service: Orthopedics;  Laterality: Left;   Social History   Occupational History  . Not on file  Tobacco Use  . Smoking status: Never Smoker  . Smokeless tobacco: Never Used  . Tobacco comment: Dementia,   Vaping Use  . Vaping Use: Never used  Substance and Sexual Activity  . Alcohol use: Not Currently    Comment: UTA  . Drug use: Not Currently    Comment: UTA  . Sexual activity: Not Currently

## 2020-05-03 ENCOUNTER — Encounter: Payer: Self-pay | Admitting: Orthopedic Surgery

## 2020-05-03 ENCOUNTER — Ambulatory Visit: Payer: Medicare Other | Admitting: Orthopedic Surgery

## 2020-05-03 ENCOUNTER — Ambulatory Visit: Payer: Self-pay

## 2020-05-03 DIAGNOSIS — S42022D Displaced fracture of shaft of left clavicle, subsequent encounter for fracture with routine healing: Secondary | ICD-10-CM

## 2020-05-05 ENCOUNTER — Encounter: Payer: Self-pay | Admitting: Orthopedic Surgery

## 2020-05-05 NOTE — Progress Notes (Signed)
   Post-Op Visit Note   Patient: Julia Carey           Date of Birth: December 01, 1927           MRN: [id number] Visit Date: 05/03/2020 PCP: System, Pcp Not In   Assessment & Plan:  Chief Complaint:  Chief Complaint  Patient presents with  . Left Shoulder - Pain   Visit Diagnoses:  1. Traumatic closed displaced fracture of shaft of clavicle with routine healing, left     Plan: Patient is an 86 year old female presents for 4-week follow-up.  She returns for evaluation of left clavicle fracture.  Bruising has significantly improved compared with previous office visit.  She is reportedly doing well at her facility.  She has no complaints of pain that can be identified.  She is nonverbal.  She follows commands and is able to lift her arm without any hesitation using the left arm.  No significant motion at the fracture site.  No tenderness to palpation over the clavicle.  Radiographs taken today reveal callus formation around the left clavicle fracture.  He remains in appropriate incision for continue nonoperative treatment.  No skin tenting is present.  Patient is doing very well and plan for patient to return to using her left arm normally for ADLs with no significant lifting.  Follow-up as needed if symptoms worsen.  Patient's caregiver agrees with plan.  Follow-Up Instructions: No follow-ups on file.   Orders:  Orders Placed This Encounter  Procedures  . XR Clavicle Left   No orders of the defined types were placed in this encounter.   Imaging: No results found.  PMFS History: Patient Active Problem List   Diagnosis Date Noted  . Closed displaced fracture of left femoral neck ([place]) 02/07/2020  . Hip fracture ([place]) 02/07/2020  . HTN (hypertension) 02/07/2020  . Leukocytosis 02/07/2020  . Closed intertrochanteric fracture of left hip, initial encounter ([place])   . Acute metabolic encephalopathy 84/50/5397  . Acute lower UTI 02/19/2019  . Hypothyroidism 09/25/2018  . Dementia ([place])  09/25/2018  . Fall at home, initial encounter 09/25/2018  . Subarachnoid hemorrhage following injury with brief loss of consciousness but without open intracranial wound ([place]) 09/25/2018  . Accelerated hypertension 09/25/2018  . Muscle weakness (generalized) 09/25/2018  . Subarachnoid hemorrhage ([place]) 09/25/2018   Past Medical History:  Diagnosis Date  . Age-related osteoporosis without current pathological fracture   . Dementia ([place])   . Dementia ([place])   . Dysphagia   . Fracture of first cervical vertebra (HCC)   . Hypothyroidism   . Insomnia   . Major depression, single episode   . Muscle weakness (generalized)     History reviewed. No pertinent family history.  Past Surgical History:  Procedure Laterality Date  . INTRAMEDULLARY (IM) NAIL INTERTROCHANTERIC Left 02/07/2020   Procedure: INTRAMEDULLARY (IM) NAIL INTERTROCHANTRIC;  Surgeon: Mcarthur Rossetti, MD;  Location: [facility];  Service: Orthopedics;  Laterality: Left;   Social History   Occupational History  . Not on file  Tobacco Use  . Smoking status: Never Smoker  . Smokeless tobacco: Never Used  . Tobacco comment: Dementia,   Vaping Use  . Vaping Use: Never used  Substance and Sexual Activity  . Alcohol use: Not Currently    Comment: UTA  . Drug use: Not Currently    Comment: UTA  . Sexual activity: Not Currently

## 2020-05-24 ENCOUNTER — Emergency Department (HOSPITAL_COMMUNITY)
Admission: EM | Admit: 2020-05-24 | Discharge: 2020-05-24 | Disposition: A | Discharge status: Home / Self Care | Attending: Emergency Medicine | Admitting: Emergency Medicine

## 2020-05-24 ENCOUNTER — Encounter (HOSPITAL_COMMUNITY): Payer: Self-pay | Admitting: Emergency Medicine

## 2020-05-24 ENCOUNTER — Emergency Department (HOSPITAL_COMMUNITY)

## 2020-05-24 DIAGNOSIS — S61412A Laceration without foreign body of left hand, initial encounter: Secondary | ICD-10-CM | POA: Diagnosis not present

## 2020-05-24 DIAGNOSIS — F0390 Unspecified dementia without behavioral disturbance: Secondary | ICD-10-CM | POA: Insufficient documentation

## 2020-05-24 DIAGNOSIS — Y9389 Activity, other specified: Secondary | ICD-10-CM | POA: Insufficient documentation

## 2020-05-24 DIAGNOSIS — I10 Essential (primary) hypertension: Secondary | ICD-10-CM | POA: Diagnosis not present

## 2020-05-24 DIAGNOSIS — S0181XA Laceration without foreign body of other part of head, initial encounter: Secondary | ICD-10-CM

## 2020-05-24 DIAGNOSIS — Y9289 Other specified places as the place of occurrence of the external cause: Secondary | ICD-10-CM | POA: Diagnosis not present

## 2020-05-24 DIAGNOSIS — Y999 Unspecified external cause status: Secondary | ICD-10-CM | POA: Diagnosis not present

## 2020-05-24 DIAGNOSIS — Z7982 Long term (current) use of aspirin: Secondary | ICD-10-CM | POA: Diagnosis not present

## 2020-05-24 DIAGNOSIS — S0003XA Contusion of scalp, initial encounter: Secondary | ICD-10-CM | POA: Diagnosis not present

## 2020-05-24 DIAGNOSIS — S02401A Maxillary fracture, unspecified, initial encounter for closed fracture: Secondary | ICD-10-CM

## 2020-05-24 DIAGNOSIS — W19XXXA Unspecified fall, initial encounter: Secondary | ICD-10-CM | POA: Diagnosis not present

## 2020-05-24 DIAGNOSIS — E039 Hypothyroidism, unspecified: Secondary | ICD-10-CM | POA: Insufficient documentation

## 2020-05-24 DIAGNOSIS — S0990XA Unspecified injury of head, initial encounter: Secondary | ICD-10-CM | POA: Diagnosis present

## 2020-05-24 DIAGNOSIS — S60222A Contusion of left hand, initial encounter: Secondary | ICD-10-CM | POA: Diagnosis not present

## 2020-05-24 LAB — URINALYSIS, ROUTINE W REFLEX MICROSCOPIC
Bilirubin Urine: NEGATIVE
Glucose, UA: NEGATIVE mg/dL
Hgb urine dipstick: NEGATIVE
Ketones, ur: NEGATIVE mg/dL
Leukocytes,Ua: NEGATIVE
Nitrite: NEGATIVE
Protein, ur: NEGATIVE mg/dL
Specific Gravity, Urine: 1.011 (ref 1.005–1.030)
pH: 9 — ABNORMAL HIGH (ref 5.0–8.0)

## 2020-05-24 LAB — COMPREHENSIVE METABOLIC PANEL
ALT: 14 U/L (ref 0–44)
AST: 22 U/L (ref 15–41)
Albumin: 3.8 g/dL (ref 3.5–5.0)
Alkaline Phosphatase: 102 U/L (ref 38–126)
Anion gap: 12 (ref 5–15)
BUN: 11 mg/dL (ref 8–23)
CO2: 25 mmol/L (ref 22–32)
Calcium: 11.1 mg/dL — ABNORMAL HIGH (ref 8.9–10.3)
Chloride: 102 mmol/L (ref 98–111)
Creatinine, Ser: 1.1 mg/dL — ABNORMAL HIGH (ref 0.44–1.00)
GFR calc Af Amer: 52 mL/min — ABNORMAL LOW (ref >60)
GFR calc non Af Amer: 44 mL/min — ABNORMAL LOW (ref >60)
Glucose, Bld: 119 mg/dL — ABNORMAL HIGH (ref 70–99)
Potassium: 4.6 mmol/L (ref 3.5–5.1)
Sodium: 139 mmol/L (ref 135–145)
Total Bilirubin: 0.8 mg/dL (ref 0.3–1.2)
Total Protein: 6.9 g/dL (ref 6.5–8.1)

## 2020-05-24 LAB — CBC WITH DIFFERENTIAL/PLATELET
Abs Immature Granulocytes: 0.06 10*3/uL (ref 0.00–0.07)
Basophils Absolute: 0.1 10*3/uL (ref 0.0–0.1)
Basophils Relative: 0 %
Eosinophils Absolute: 0.1 10*3/uL (ref 0.0–0.5)
Eosinophils Relative: 1 %
HCT: 44.9 % (ref 36.0–46.0)
Hemoglobin: 14.4 g/dL (ref 12.0–15.0)
Immature Granulocytes: 0 %
Lymphocytes Relative: 8 %
Lymphs Abs: 1 10*3/uL (ref 0.7–4.0)
MCH: 32.1 pg (ref 26.0–34.0)
MCHC: 32.1 g/dL (ref 30.0–36.0)
MCV: 100.2 fL — ABNORMAL HIGH (ref 80.0–100.0)
Monocytes Absolute: 1 10*3/uL (ref 0.1–1.0)
Monocytes Relative: 8 %
Neutro Abs: 11.2 10*3/uL — ABNORMAL HIGH (ref 1.7–7.7)
Neutrophils Relative %: 83 %
Platelets: 279 10*3/uL (ref 150–400)
RBC: 4.48 MIL/uL (ref 3.87–5.11)
RDW: 12.3 % (ref 11.5–15.5)
WBC: 13.5 10*3/uL — ABNORMAL HIGH (ref 4.0–10.5)
nRBC: 0 % (ref 0.0–0.2)

## 2020-05-24 LAB — CK: Total CK: 45 U/L (ref 38–234)

## 2020-05-24 MED ORDER — LIDOCAINE-EPINEPHRINE 1 %-1:100000 IJ SOLN
10.0000 mL | Freq: Once | INTRAMUSCULAR | Status: AC
  Administered 2020-05-24: 10 mL
  Filled 2020-05-24: qty 1

## 2020-05-24 NOTE — ED Provider Notes (Signed)
  Physical Exam  BP (!) 209/111 (BP Location: Right Arm)   Pulse 92   Temp 97.8 F (36.6 C) (Oral)   Resp 16   SpO2 96%   Physical Exam Vitals and nursing note reviewed.  Constitutional:      General: She is not in acute distress.    Appearance: She is well-developed.     Comments: Frail, elderly female resting on stretcher in NAD. In C-collar. She is non-verbal  HENT:     Head: Normocephalic.     Comments: Dried blood on the face. R periorbital and cheek ecchymosis. 2cm horizontal laceration above the R eyebrow Eyes:     General: No scleral icterus.       Right eye: No discharge.        Left eye: No discharge.     Conjunctiva/sclera: Conjunctivae normal.     Pupils: Pupils are equal, round, and reactive to light.  Cardiovascular:     Rate and Rhythm: Normal rate.  Pulmonary:     Effort: Pulmonary effort is normal. No respiratory distress.  Abdominal:     General: There is no distension.  Musculoskeletal:     Cervical back: Normal range of motion.  Skin:    General: Skin is warm and dry.  Neurological:     Mental Status: She is alert and oriented to person, place, and time.  Psychiatric:        Behavior: Behavior normal.     ED Course/Procedures     .Marland KitchenLaceration Repair  Date/Time: 05/24/2020 9:26 AM Performed by: Recardo Evangelist, PA-C Authorized by: Recardo Evangelist, PA-C   Consent:    Consent obtained:  Verbal   Consent given by:  Healthcare agent   Risks discussed:  Infection, pain, poor cosmetic result and poor wound healing   Alternatives discussed:  No treatment Anesthesia (see MAR for exact dosages):    Anesthesia method:  Local infiltration   Local anesthetic:  Lidocaine 1% WITH epi Laceration details:    Location:  Face   Face location:  R eyebrow   Length (cm):  2   Depth (mm):  3 Repair type:    Repair type:  Simple Pre-procedure details:    Preparation:  Patient was prepped and draped in usual sterile fashion Exploration:    Wound  exploration: wound explored through full range of motion and entire depth of wound probed and visualized   Treatment:    Area cleansed with:  Saline   Amount of cleaning:  Standard   Irrigation solution:  Sterile saline   Irrigation method:  Syringe   Visualized foreign bodies/material removed: no   Skin repair:    Repair method:  Sutures   Suture size:  5-0   Wound skin closure material used: vicryl rapide.   Suture technique:  Simple interrupted   Number of sutures:  4 Approximation:    Approximation:  Close Post-procedure details:    Dressing:  Sterile dressing   Patient tolerance of procedure:  Tolerated well, no immediate complications    MDM        Recardo Evangelist, PA-C 05/24/20 [place]    Isla Pence, MD 05/24/20 1105

## 2020-05-24 NOTE — ED Notes (Signed)
Report called to [facility] RN pt taken by [facility].

## 2020-05-24 NOTE — ED Notes (Signed)
Pt transported to xray and CT

## 2020-05-24 NOTE — ED Provider Notes (Signed)
[facility] Provider Note   CSN: [id number] Arrival date & time: 05/24/20  0732     History Chief Complaint  Patient presents with  . Fall    [facility] is a 86 y.o. female.  Pt presents to the ED today with a fall.  The pt is nonverbal and is unable to give a hx.  Pt was found by the SNF staff prone on the floor this morning.  Her bed is only 1.5 feet off the ground due to a hx of falls.  EMS said the staff said she was last in bed around 0530.  Pt has a hematoma to her head and to her left hand.          Past Medical History:  Diagnosis Date  . Age-related osteoporosis without current pathological fracture   . Dementia ([place])   . Dementia ([place])   . Dysphagia   . Fracture of first cervical vertebra (HCC)   . Hypothyroidism   . Insomnia   . Major depression, single episode   . Muscle weakness (generalized)     Patient Active Problem List   Diagnosis Date Noted  . Closed displaced fracture of left femoral neck ([place]) 02/07/2020  . Hip fracture ([place]) 02/07/2020  . HTN (hypertension) 02/07/2020  . Leukocytosis 02/07/2020  . Closed intertrochanteric fracture of left hip, initial encounter ([place])   . Acute metabolic encephalopathy 62/22/9798  . Acute lower UTI 02/19/2019  . Hypothyroidism 09/25/2018  . Dementia ([place]) 09/25/2018  . Fall at home, initial encounter 09/25/2018  . Subarachnoid hemorrhage following injury with brief loss of consciousness but without open intracranial wound ([place]) 09/25/2018  . Accelerated hypertension 09/25/2018  . Muscle weakness (generalized) 09/25/2018  . Subarachnoid hemorrhage ([place]) 09/25/2018    Past Surgical History:  Procedure Laterality Date  . INTRAMEDULLARY (IM) NAIL INTERTROCHANTERIC Left 02/07/2020   Procedure: INTRAMEDULLARY (IM) NAIL INTERTROCHANTRIC;  Surgeon: Mcarthur Rossetti, MD;  Location: [facility];  Service: Orthopedics;  Laterality: Left;     OB History   No obstetric  history on file.     No family history on file.  Social History   Tobacco Use  . Smoking status: Never Smoker  . Smokeless tobacco: Never Used  . Tobacco comment: Dementia,   Vaping Use  . Vaping Use: Never used  Substance Use Topics  . Alcohol use: Not Currently    Comment: UTA  . Drug use: Not Currently    Comment: UTA    Home Medications Prior to Admission medications   Medication Sig Start Date End Date Taking? Authorizing Provider  acetaminophen (TYLENOL) 500 MG tablet Take 2 tablets (1,000 mg total) by mouth every 6 (six) hours as needed for moderate pain. 03/27/20   Little, Wenda Overland, MD  aspirin EC 325 MG EC tablet Take 1 tablet (325 mg total) by mouth daily with breakfast. 02/10/20   Harold Hedge, MD  Carboxymeth-Glycerin-Polysorb (REFRESH OPTIVE ADVANCED) 0.5-1-0.5 % SOLN Place 1 drop into both eyes 2 (two) times daily.     [provider]  cetirizine (ZYRTEC) 5 MG tablet Take 5 mg by mouth daily.    [provider]  donepezil (ARICEPT) 10 MG tablet Take 10 mg by mouth at bedtime.     [provider]  DULoxetine (CYMBALTA) 30 MG capsule Take 30 mg by mouth daily.    [provider]  Eyelid Cleansers (OCUSOFT EYELID CLEANSING EX) Place 1 application into both eyes daily.  [provider]  ferrous sulfate 300 (60 Fe) MG/5ML syrup Take 300 mg by mouth 2 (two) times daily with a meal.    [provider]  fluticasone (FLONASE) 50 MCG/ACT nasal spray Place 1 spray into both nostrils daily.    [provider]  HYDROcodone-acetaminophen (NORCO/VICODIN) 5-325 MG tablet Take 1 tablet by mouth every 6 (six) hours as needed for moderate pain or severe pain.  02/11/20   [provider]  levothyroxine (SYNTHROID) 137 MCG tablet Take 137 mcg by mouth daily. 03/23/20   [provider]  loperamide (IMODIUM A-D) 2 MG tablet Take 2 mg by mouth 4 (four) times daily as needed for diarrhea or loose stools.     [provider]  losartan (COZAAR) 100 MG tablet Take 1 tablet (100 mg total) by mouth daily for 30 days. 02/21/19 02/07/20  Aline August, MD  losartan (COZAAR) 100 MG tablet Take 100 mg by mouth daily.    [provider]  Melatonin 3 MG TABS Take 3 mg by mouth at bedtime.    [provider]  methocarbamol (ROBAXIN) 500 MG tablet Take 500 mg by mouth 2 (two) times daily as needed for muscle spasms.    [provider]  metoprolol tartrate (LOPRESSOR) 25 MG tablet Take 12.5 mg by mouth every morning.     [provider]  omeprazole (PRILOSEC) 20 MG capsule Take 20 mg by mouth daily as needed (reflux).    [provider]  polyethylene glycol (MIRALAX / GLYCOLAX) packet Take 17 g by mouth daily. MIX INTO 8 OUNCES OF LIQUID    [provider]  PROTEIN PO Take 1 Bottle by mouth 3 (three) times daily after meals.    [provider]  vitamin B-12 (CYANOCOBALAMIN) 1000 MCG tablet Take 1,000 mcg by mouth every other day.     [provider]    Allergies    Amoxicillin, Clindamycin/lincomycin, and Keflex [cephalexin]  Review of Systems   Review of Systems  Unable to perform ROS: Patient nonverbal    Physical Exam Updated Vital Signs BP (!) 146/116   Pulse 92   Temp 97.8 F (36.6 C) (Oral)   Resp 16   SpO2 98%   Physical Exam Vitals and nursing note reviewed.  HENT:     Head:      Comments: Large hematoma on right forehead.  Lac above right eyebrow.    Right Ear: External ear normal.     Left Ear: External ear normal.     Mouth/Throat:     Mouth: Mucous membranes are dry.  Eyes:     Extraocular Movements: Extraocular movements intact.     Conjunctiva/sclera: Conjunctivae normal.     Pupils: Pupils are equal, round, and reactive to light.  Neck:     Comments: In c-collar.  She does not appear to have pain upon palpation. Cardiovascular:     Rate and Rhythm: Normal rate and regular rhythm.     Pulses:  Normal pulses.     Heart sounds: Normal heart sounds.  Pulmonary:     Effort: Pulmonary effort is normal.     Breath sounds: Normal breath sounds.  Abdominal:     General: Abdomen is flat. Bowel sounds are normal.     Palpations: Abdomen is soft.  Musculoskeletal:     Comments: Large hematoma to left hand with a skin tear  Skin:    Capillary Refill: Capillary refill takes less than 2 seconds.  Neurological:  Mental Status: She is alert.     Comments: Pt is nonverbal, but is following basic commands.     ED Results / Procedures / Treatments   Labs (all labs ordered are listed, but only abnormal results are displayed) Labs Reviewed  COMPREHENSIVE METABOLIC PANEL - Abnormal; Notable for the following components:      Result Value   Glucose, Bld 119 (*)    Creatinine, Ser 1.10 (*)    Calcium 11.1 (*)    GFR calc non Af Amer 44 (*)    GFR calc Af Amer 52 (*)    All other components within normal limits  CBC WITH DIFFERENTIAL/PLATELET - Abnormal; Notable for the following components:   WBC 13.5 (*)    MCV 100.2 (*)    Neutro Abs 11.2 (*)    All other components within normal limits  URINALYSIS, ROUTINE W REFLEX MICROSCOPIC - Abnormal; Notable for the following components:   APPearance CLOUDY (*)    pH 9.0 (*)    All other components within normal limits  CK    EKG None  Radiology DG Chest 1 View  Result Date: 05/24/2020 CLINICAL DATA:  Recent fall with chest pain, initial encounter EXAM: CHEST  1 VIEW COMPARISON:  03/27/2020 FINDINGS: Cardiac shadow is within normal limits. Aortic calcifications are again seen. The lungs are well aerated without focal infiltrate. No definitive pneumothorax is seen. Skin folds are noted over the left chest simulating pneumothorax. Healing left clavicular fracture is seen. No definitive rib abnormality is noted IMPRESSION: Chronic changes as described above.  No acute abnormality noted. Electronically Signed   By: Inez Catalina M.D.   On:  05/24/2020 08:40   DG Pelvis 1-2 Views  Result Date: 05/24/2020 CLINICAL DATA:  Recent fall with pelvic pain, initial encounter EXAM: PELVIS - 1-2 VIEW COMPARISON:  02/07/2020 FINDINGS: Postsurgical changes are noted in the proximal left femur stable from the prior exam. Some callus formation in the proximal femur is noted on the left. Pelvic ring appears intact. No fracture or dislocation is noted. IMPRESSION: Chronic changes without acute abnormality Electronically Signed   By: Inez Catalina M.D.   On: 05/24/2020 08:40   CT Head Wo Contrast  Result Date: 05/24/2020 CLINICAL DATA:  Unwitnessed fall. Baseline dementia. Facial trauma and laceration. EXAM: CT HEAD WITHOUT CONTRAST CT MAXILLOFACIAL WITHOUT CONTRAST CT CERVICAL SPINE WITHOUT CONTRAST TECHNIQUE: Multidetector CT imaging of the head, cervical spine, and maxillofacial structures were performed using the standard protocol without intravenous contrast. Multiplanar CT image reconstructions of the cervical spine and maxillofacial structures were also generated. COMPARISON:  Head/cervical spine CT 03/27/2020 as well as head, cervical and maxillofacial CT 11/02/2019 FINDINGS: CT HEAD FINDINGS Brain: Mild motion artifact. Ventricles, cisterns and other CSF spaces are unchanged. There is moderate chronic ischemic microvascular disease present. Small old lacunar infarct over the right periventricular white matter. There is no mass, mass effect, shift of midline structures or acute hemorrhage. No evidence of acute infarction. Stable small extra-axial lipoma posterior to the tectal plate. Vascular: No hyperdense vessel or unexpected calcification. Skull: No evidence of acute skull fracture. Possible subtle fracture along the lateral wall right maxillary sinus with air-fluid level over the right maxillary sinus. Other: None. CT MAXILLOFACIAL FINDINGS Osseous: Possible subtle nondisplaced fracture along the lateral wall the right maxillary sinus as there is a  few small foci of air just superficial to the bony lateral border. Moderate air-fluid level over the right maxillary sinus likely hemorrhagic debris. No additional acute  facial bone fractures. Orbits: Negative. No traumatic or inflammatory finding. Sinuses: Moderate air fluid over the right maxillary sinus. Remaining paranasal sinuses are clear. Mastoid air cells are clear. Soft tissues: Minimal superior right periorbital soft tissue swelling. CT CERVICAL SPINE FINDINGS Alignment: Chronic stable changes at the occipital atlantal and atlantoaxial articulation. No acute posttraumatic subluxation. Stable kyphosis centered over the C5 level. Stable lateral curvature of the cervical spine convex right. Skull base and vertebrae: Stable chronic changes of the occipital atlantal articulation and C1-2 articulation. Mild to moderate spondylosis of the cervical spine. Moderate uncovertebral joint spurring and facet arthropathy. There is no acute fracture. Soft tissues and spinal canal: No prevertebral fluid or swelling. No visible canal hematoma. Disc levels: Moderate disc space narrowing at the C4-5, C5-6 and C6-7 levels unchanged. Upper chest: No acute findings. Known displaced left clavicle fracture. Other: None. IMPRESSION: 1.  No acute brain injury. 2. Chronic ischemic microvascular disease and small old right periventricular lacunar infarct unchanged. 3. Findings suggesting nondisplaced fracture of the lateral wall the right maxillary sinus with associated air-fluid level within the right maxillary sinus likely hemorrhagic debris. Right periorbital soft tissue swelling. 4.  No acute cervical spine injury. 5. Moderate spondylosis of the cervical spine with significant chronic stable curvature as described. Multilevel disc disease. Stable chronic changes at the occipital atlantal and C1-2 articulations. 6.  Known displaced left clavicle fracture. Electronically Signed   By: Marin Olp M.D.   On: 05/24/2020 09:18   CT  Cervical Spine Wo Contrast  Result Date: 05/24/2020 CLINICAL DATA:  Unwitnessed fall. Baseline dementia. Facial trauma and laceration. EXAM: CT HEAD WITHOUT CONTRAST CT MAXILLOFACIAL WITHOUT CONTRAST CT CERVICAL SPINE WITHOUT CONTRAST TECHNIQUE: Multidetector CT imaging of the head, cervical spine, and maxillofacial structures were performed using the standard protocol without intravenous contrast. Multiplanar CT image reconstructions of the cervical spine and maxillofacial structures were also generated. COMPARISON:  Head/cervical spine CT 03/27/2020 as well as head, cervical and maxillofacial CT 11/02/2019 FINDINGS: CT HEAD FINDINGS Brain: Mild motion artifact. Ventricles, cisterns and other CSF spaces are unchanged. There is moderate chronic ischemic microvascular disease present. Small old lacunar infarct over the right periventricular white matter. There is no mass, mass effect, shift of midline structures or acute hemorrhage. No evidence of acute infarction. Stable small extra-axial lipoma posterior to the tectal plate. Vascular: No hyperdense vessel or unexpected calcification. Skull: No evidence of acute skull fracture. Possible subtle fracture along the lateral wall right maxillary sinus with air-fluid level over the right maxillary sinus. Other: None. CT MAXILLOFACIAL FINDINGS Osseous: Possible subtle nondisplaced fracture along the lateral wall the right maxillary sinus as there is a few small foci of air just superficial to the bony lateral border. Moderate air-fluid level over the right maxillary sinus likely hemorrhagic debris. No additional acute facial bone fractures. Orbits: Negative. No traumatic or inflammatory finding. Sinuses: Moderate air fluid over the right maxillary sinus. Remaining paranasal sinuses are clear. Mastoid air cells are clear. Soft tissues: Minimal superior right periorbital soft tissue swelling. CT CERVICAL SPINE FINDINGS Alignment: Chronic stable changes at the occipital  atlantal and atlantoaxial articulation. No acute posttraumatic subluxation. Stable kyphosis centered over the C5 level. Stable lateral curvature of the cervical spine convex right. Skull base and vertebrae: Stable chronic changes of the occipital atlantal articulation and C1-2 articulation. Mild to moderate spondylosis of the cervical spine. Moderate uncovertebral joint spurring and facet arthropathy. There is no acute fracture. Soft tissues and spinal canal: No prevertebral fluid or swelling.  No visible canal hematoma. Disc levels: Moderate disc space narrowing at the C4-5, C5-6 and C6-7 levels unchanged. Upper chest: No acute findings. Known displaced left clavicle fracture. Other: None. IMPRESSION: 1.  No acute brain injury. 2. Chronic ischemic microvascular disease and small old right periventricular lacunar infarct unchanged. 3. Findings suggesting nondisplaced fracture of the lateral wall the right maxillary sinus with associated air-fluid level within the right maxillary sinus likely hemorrhagic debris. Right periorbital soft tissue swelling. 4.  No acute cervical spine injury. 5. Moderate spondylosis of the cervical spine with significant chronic stable curvature as described. Multilevel disc disease. Stable chronic changes at the occipital atlantal and C1-2 articulations. 6.  Known displaced left clavicle fracture. Electronically Signed   By: Marin Olp M.D.   On: 05/24/2020 09:18   DG Hand Complete Left  Result Date: 05/24/2020 CLINICAL DATA:  Recent fall with left hand pain, initial encounter EXAM: LEFT HAND - COMPLETE 3+ VIEW COMPARISON:  None. FINDINGS: Severe interphalangeal joint degenerative changes are noted without acute fracture. [facility] joint degenerative changes are noted as well. No soft tissue abnormality is seen. IMPRESSION: Significant degenerative changes without acute bony abnormality. Electronically Signed   By: Inez Catalina M.D.   On: 05/24/2020 08:39   CT Maxillofacial Wo  Contrast  Result Date: 05/24/2020 CLINICAL DATA:  Unwitnessed fall. Baseline dementia. Facial trauma and laceration. EXAM: CT HEAD WITHOUT CONTRAST CT MAXILLOFACIAL WITHOUT CONTRAST CT CERVICAL SPINE WITHOUT CONTRAST TECHNIQUE: Multidetector CT imaging of the head, cervical spine, and maxillofacial structures were performed using the standard protocol without intravenous contrast. Multiplanar CT image reconstructions of the cervical spine and maxillofacial structures were also generated. COMPARISON:  Head/cervical spine CT 03/27/2020 as well as head, cervical and maxillofacial CT 11/02/2019 FINDINGS: CT HEAD FINDINGS Brain: Mild motion artifact. Ventricles, cisterns and other CSF spaces are unchanged. There is moderate chronic ischemic microvascular disease present. Small old lacunar infarct over the right periventricular white matter. There is no mass, mass effect, shift of midline structures or acute hemorrhage. No evidence of acute infarction. Stable small extra-axial lipoma posterior to the tectal plate. Vascular: No hyperdense vessel or unexpected calcification. Skull: No evidence of acute skull fracture. Possible subtle fracture along the lateral wall right maxillary sinus with air-fluid level over the right maxillary sinus. Other: None. CT MAXILLOFACIAL FINDINGS Osseous: Possible subtle nondisplaced fracture along the lateral wall the right maxillary sinus as there is a few small foci of air just superficial to the bony lateral border. Moderate air-fluid level over the right maxillary sinus likely hemorrhagic debris. No additional acute facial bone fractures. Orbits: Negative. No traumatic or inflammatory finding. Sinuses: Moderate air fluid over the right maxillary sinus. Remaining paranasal sinuses are clear. Mastoid air cells are clear. Soft tissues: Minimal superior right periorbital soft tissue swelling. CT CERVICAL SPINE FINDINGS Alignment: Chronic stable changes at the occipital atlantal and  atlantoaxial articulation. No acute posttraumatic subluxation. Stable kyphosis centered over the C5 level. Stable lateral curvature of the cervical spine convex right. Skull base and vertebrae: Stable chronic changes of the occipital atlantal articulation and C1-2 articulation. Mild to moderate spondylosis of the cervical spine. Moderate uncovertebral joint spurring and facet arthropathy. There is no acute fracture. Soft tissues and spinal canal: No prevertebral fluid or swelling. No visible canal hematoma. Disc levels: Moderate disc space narrowing at the C4-5, C5-6 and C6-7 levels unchanged. Upper chest: No acute findings. Known displaced left clavicle fracture. Other: None. IMPRESSION: 1.  No acute brain injury. 2. Chronic ischemic  microvascular disease and small old right periventricular lacunar infarct unchanged. 3. Findings suggesting nondisplaced fracture of the lateral wall the right maxillary sinus with associated air-fluid level within the right maxillary sinus likely hemorrhagic debris. Right periorbital soft tissue swelling. 4.  No acute cervical spine injury. 5. Moderate spondylosis of the cervical spine with significant chronic stable curvature as described. Multilevel disc disease. Stable chronic changes at the occipital atlantal and C1-2 articulations. 6.  Known displaced left clavicle fracture. Electronically Signed   By: Marin Olp M.D.   On: 05/24/2020 09:18    Procedures Procedures (including critical care time)  Medications Ordered in ED Medications  lidocaine-EPINEPHrine (XYLOCAINE W/EPI) 1 %-1:100000 (with pres) injection 10 mL (10 mLs Infiltration Given by Other 05/24/20 0830)    ED Course  I have reviewed the triage vital signs and the nursing notes.  Pertinent labs & imaging results that were available during my care of the patient were reviewed by me and considered in my medical decision making (see chart for details).    MDM Rules/Calculators/A&P                          CT shows a maxillary sinus fx, but no other fractures in the head, face, or neck that are new.  She has a known displaced clavicle fx that is followed by Dr. Marlou Sa.   Pt's laceration sewn by PA G[place].  Dressing applied to skin tear on hand.   Pt's POA in the room.  Results d/w him.   Pt is stable for d/c.  Return if worse. Final Clinical Impression(s) / ED Diagnoses Final diagnoses:  Fall, initial encounter  Facial laceration, initial encounter  Hematoma of scalp, initial encounter  Skin tear of left hand without complication, initial encounter  Traumatic hematoma of left hand, initial encounter  Closed fracture of maxillary sinus, initial encounter [facility])    Rx / [place] Orders ED Discharge Orders    None       Isla Pence, MD 05/24/20 1125

## 2020-05-24 NOTE — Progress Notes (Signed)
Manufacturing engineer [facility]) Hospital Liaison note.    Ms Julia [facility] is currently a hospice patient with [facility].  Please do not hesitate to call with questions.    Thank you for the opportunity to participate in this patient's care.  Domenic Moras, BSN, RN [place] (listed on [place] under Hospice/[facility])    [phone]

## 2020-05-24 NOTE — ED Triage Notes (Signed)
Pt arrives from morning view after having an unwitnessed fall, unclear the last time someone seen pt in her bed pt pt was found right before 7am lying prone in floor. Pt at baseline is non verbal due to dementia. Pt has large lac above right eye with hematoma and skin tear with swelling to left hand.

## 2020-12-24 IMAGING — CT CT CERVICAL SPINE WITHOUT CONTRAST
2 series · 13 of 27 positions shown, 16 images · non-contrast
Comparison: February 19, 2019 CT head, CT cervical spine January 23, 2019

CLINICAL DATA: Dementia.  Status post fall

EXAM:
CT HEAD WITHOUT CONTRAST
CT CERVICAL SPINE WITHOUT CONTRAST
TECHNIQUE: Multidetector CT imaging of the head and cervical spine was
performed following the standard protocol without intravenous
contrast. Multiplanar CT image reconstructions of the cervical spine
were also generated.

[Series 4: c spine soft · axial · 0.40mm/px · z∈[-266,-138]mm · 8 of 76 slices shown, 10 images]
[im 6/76  soft-tissue]
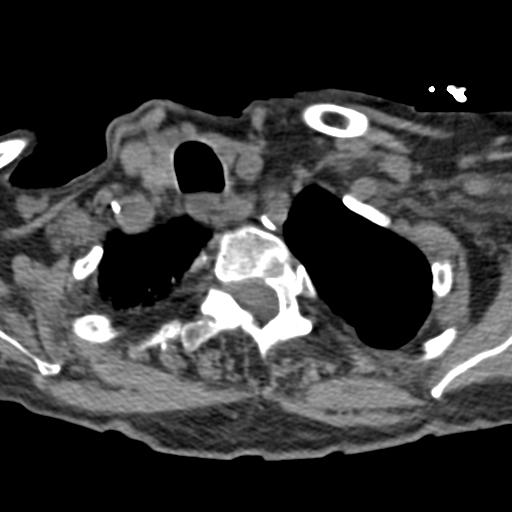
[im 6/76  bone]
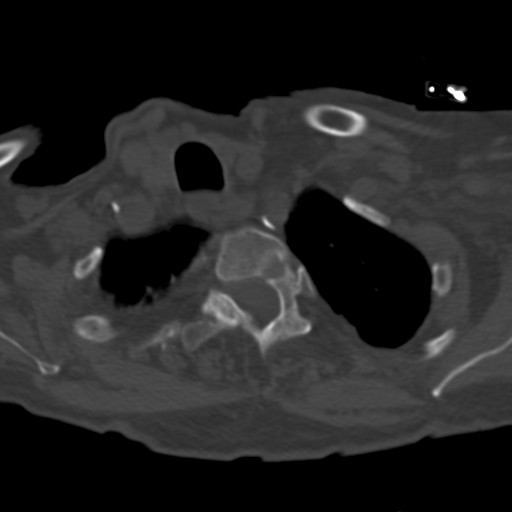
[im 18/76  bone]
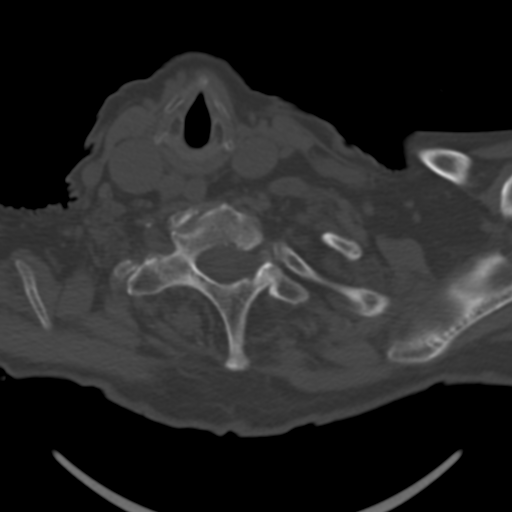
[im 24/76  bone]
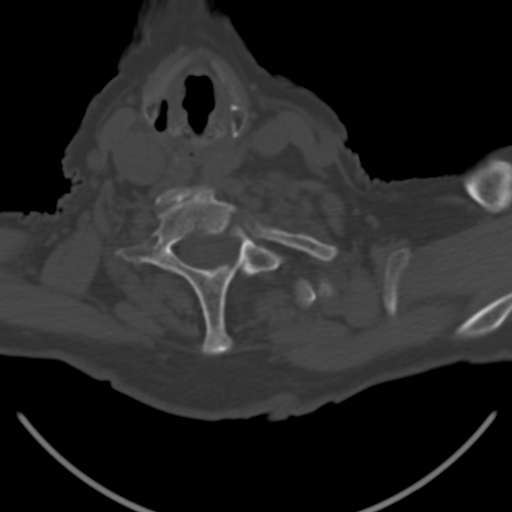
[im 35/76  bone]
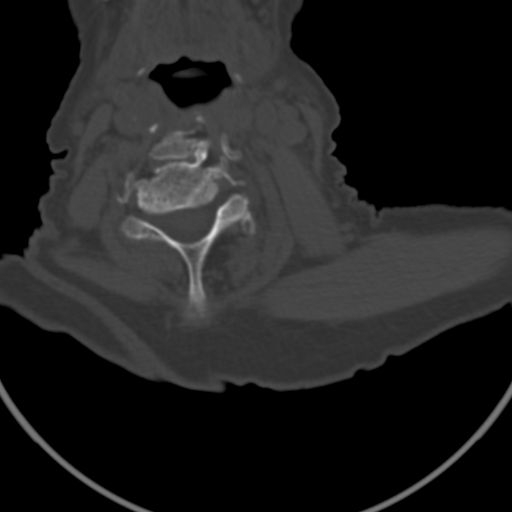
[im 41/76  soft-tissue]
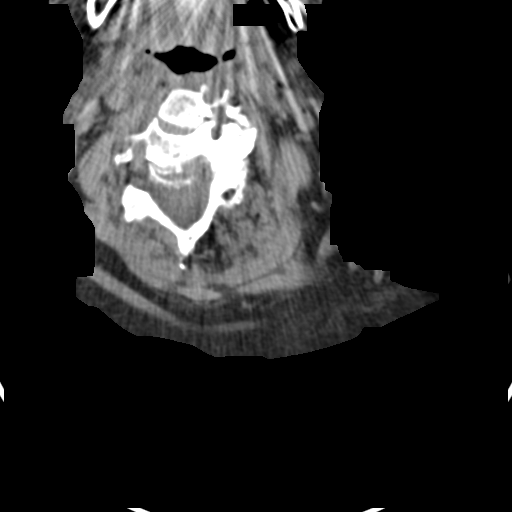
[im 41/76  bone]
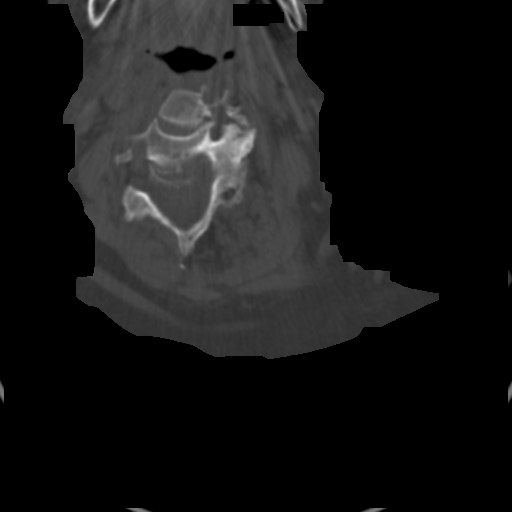
[im 52/76  bone]
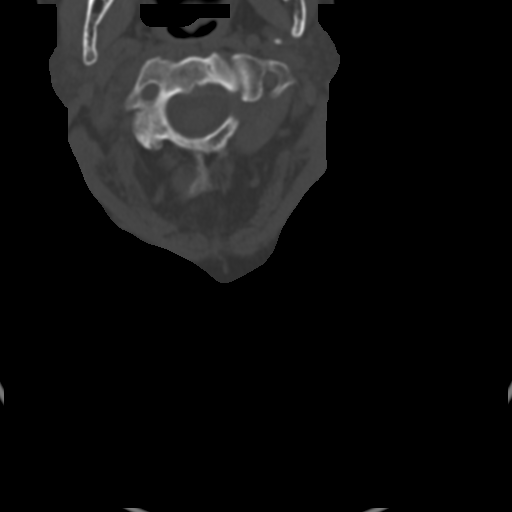
[im 58/76  bone]
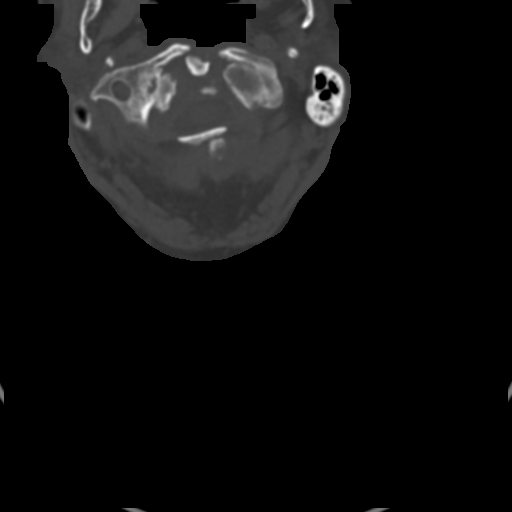
[im 70/76  bone]
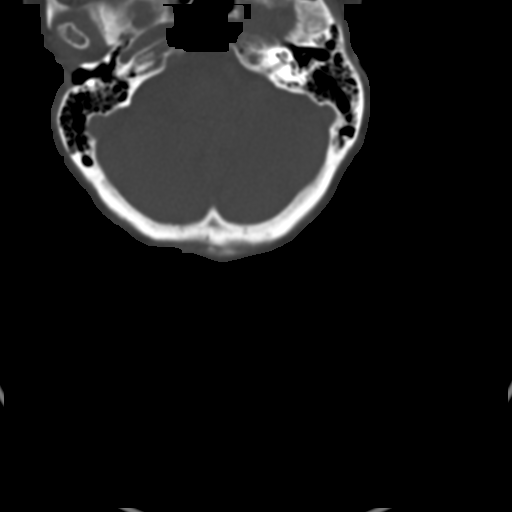

[Series 7: sagittal bone · sagittal · 0.31mm/px · 5 of 52 slices shown, 6 images]
[im 18/52  bone]
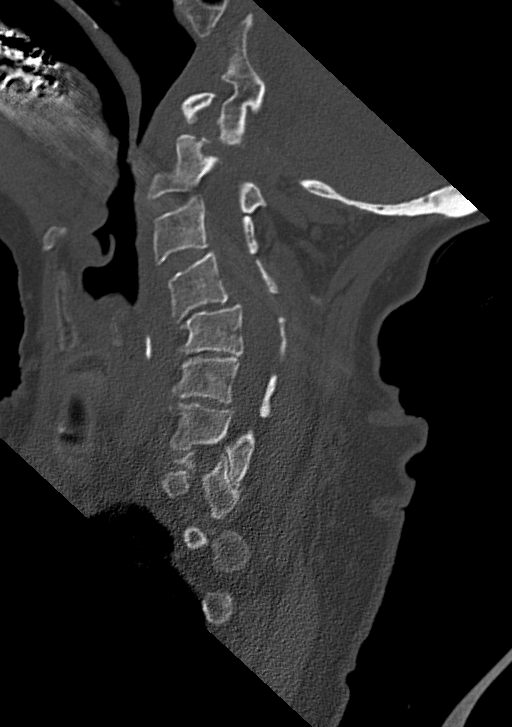
[im 22/52  bone]
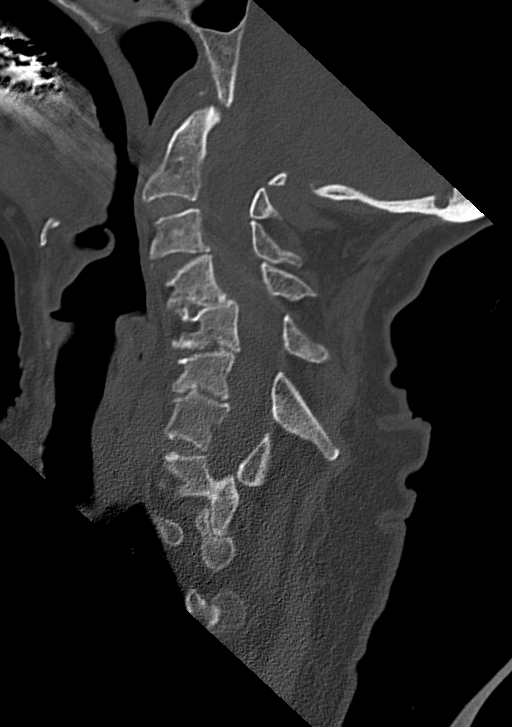
[im 26/52  soft-tissue]
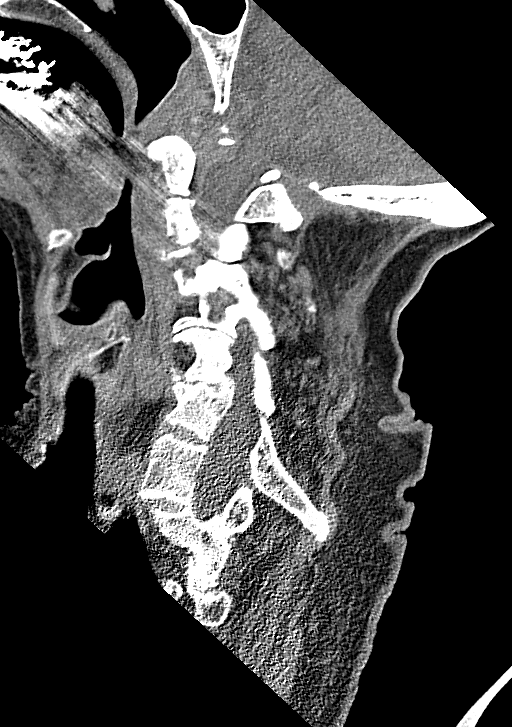
[im 26/52  bone]
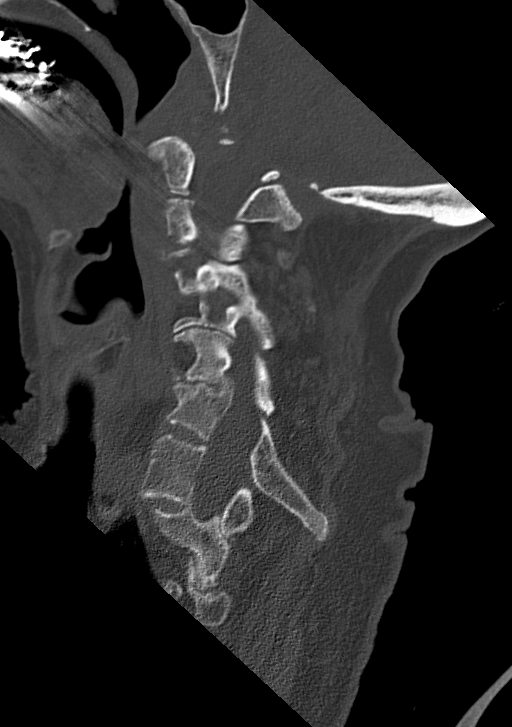
[im 30/52  bone]
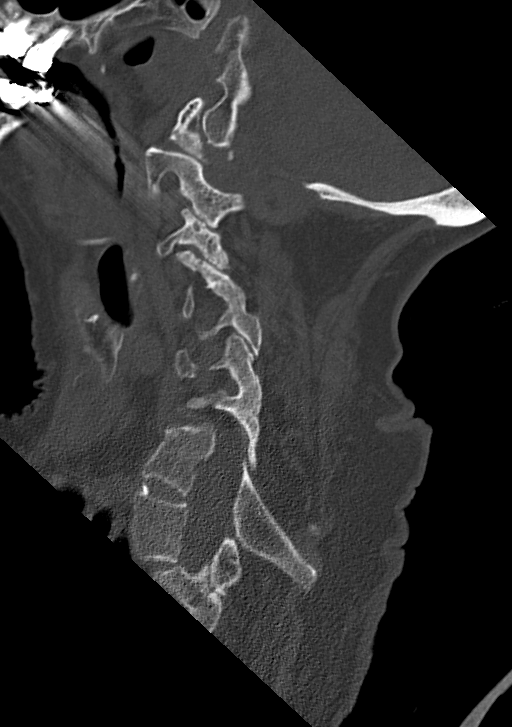
[im 35/52  bone]
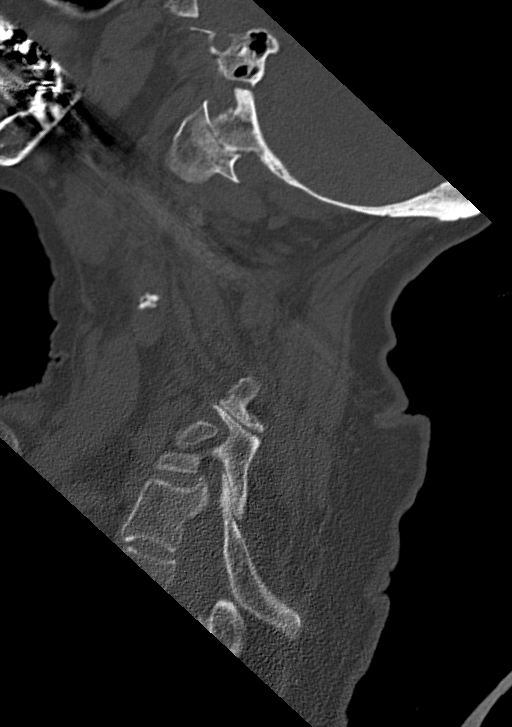

[13 of 27 positions shown; findings below may reference images not displayed]

FINDINGS: CT HEAD FINDINGS

Brain: No evidence of acute infarction, hemorrhage, hydrocephalus,
extra-axial collection or mass lesion/mass effect. There is chronic
diffuse atrophy. Chronic bilateral periventricular white matter
small vessel ischemic changes noted.

Vascular: No hyperdense vessel is noted.

Skull: Normal. Negative for fracture or focal lesion.

Sinuses/Orbits: No acute finding.

Other: None.

CT CERVICAL SPINE FINDINGS

Alignment: Stable alignment.  Stable dextroscoliosis unchanged.

Skull base and vertebrae: Stable chronic deformity of C1 arch
unchanged. No acute displaced fracture.

Soft tissues and spinal canal: No prevertebral fluid or swelling. No
visible canal hematoma.

Disc levels: Degenerative spondylosis throughout the cervical spine
with narrowed joint space osteophyte formation identified.

Upper chest: Scarring of the lung apices are identified unchanged.

Other: Carotid atherosclerosis.
IMPRESSION: No focal acute intracranial abnormality identified.

Chronic diffuse atrophy and chronic bilateral periventricular white
matter small vessel ischemic change.

No acute fracture or dislocation identified in the cervical spine.

Chronic deformity of C1 arch unchanged.

## 2021-02-10 DEATH — deceased
